# Patient Record
Sex: Female | Born: 1950
Health system: Southern US, Community
[De-identification: ages and names within clinical notes are randomized; demographics above are authoritative.]

## PROBLEM LIST (undated history)

## (undated) DIAGNOSIS — E559 Vitamin D deficiency, unspecified: Secondary | ICD-10-CM

## (undated) DIAGNOSIS — R9431 Abnormal electrocardiogram [ECG] [EKG]: Secondary | ICD-10-CM

## (undated) DIAGNOSIS — R0683 Snoring: Secondary | ICD-10-CM

## (undated) DIAGNOSIS — N189 Chronic kidney disease, unspecified: Secondary | ICD-10-CM

## (undated) DIAGNOSIS — K5790 Diverticulosis of intestine, part unspecified, without perforation or abscess without bleeding: Secondary | ICD-10-CM

## (undated) DIAGNOSIS — N3943 Post-void dribbling: Secondary | ICD-10-CM

## (undated) DIAGNOSIS — E785 Hyperlipidemia, unspecified: Secondary | ICD-10-CM

## (undated) DIAGNOSIS — T7840XA Allergy, unspecified, initial encounter: Secondary | ICD-10-CM

## (undated) DIAGNOSIS — B977 Papillomavirus as the cause of diseases classified elsewhere: Secondary | ICD-10-CM

## (undated) DIAGNOSIS — H269 Unspecified cataract: Secondary | ICD-10-CM

## (undated) DIAGNOSIS — I10 Essential (primary) hypertension: Secondary | ICD-10-CM

## (undated) DIAGNOSIS — E119 Type 2 diabetes mellitus without complications: Secondary | ICD-10-CM

## (undated) DIAGNOSIS — G2581 Restless legs syndrome: Secondary | ICD-10-CM

## (undated) DIAGNOSIS — M1990 Unspecified osteoarthritis, unspecified site: Secondary | ICD-10-CM

## (undated) HISTORY — DX: Chronic kidney disease, unspecified: N18.9

## (undated) HISTORY — DX: Hyperlipidemia, unspecified: E78.5

## (undated) HISTORY — DX: Allergy, unspecified, initial encounter: T78.40XA

## (undated) HISTORY — DX: Restless legs syndrome: G25.81

## (undated) HISTORY — DX: Vitamin D deficiency, unspecified: E55.9

## (undated) HISTORY — DX: Unspecified cataract: H26.9

## (undated) HISTORY — DX: Essential (primary) hypertension: I10

## (undated) HISTORY — DX: Type 2 diabetes mellitus without complications: E11.9

## (undated) HISTORY — DX: Abnormal electrocardiogram (ECG) (EKG): R94.31

## (undated) HISTORY — DX: Unspecified osteoarthritis, unspecified site: M19.90

## (undated) HISTORY — DX: Papillomavirus as the cause of diseases classified elsewhere: B97.7

---
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
## 1979-10-15 HISTORY — PX: TUBAL LIGATION: SHX77

## 1979-10-15 HISTORY — PX: OVARIAN CYST REMOVAL: SHX89

## 1990-10-14 HISTORY — PX: VAGINAL HYSTERECTOMY: SUR661

## 1998-11-28 ENCOUNTER — Ambulatory Visit (HOSPITAL_COMMUNITY): Admission: RE | Admit: 1998-11-28 | Discharge: 1998-11-28 | Payer: Self-pay | Admitting: Obstetrics & Gynecology

## 1998-11-28 ENCOUNTER — Encounter: Payer: Self-pay | Admitting: Obstetrics & Gynecology

## 1999-11-13 ENCOUNTER — Other Ambulatory Visit: Admission: RE | Admit: 1999-11-13 | Discharge: 1999-11-13 | Payer: Self-pay | Admitting: Obstetrics and Gynecology

## 1999-11-30 ENCOUNTER — Encounter: Payer: Self-pay | Admitting: Obstetrics & Gynecology

## 1999-11-30 ENCOUNTER — Ambulatory Visit (HOSPITAL_COMMUNITY): Admission: RE | Admit: 1999-11-30 | Discharge: 1999-11-30 | Payer: Self-pay | Admitting: Obstetrics & Gynecology

## 2000-12-11 ENCOUNTER — Other Ambulatory Visit: Admission: RE | Admit: 2000-12-11 | Discharge: 2000-12-11 | Payer: Self-pay | Admitting: Obstetrics and Gynecology

## 2000-12-30 ENCOUNTER — Encounter: Payer: Self-pay | Admitting: Obstetrics and Gynecology

## 2000-12-30 ENCOUNTER — Ambulatory Visit (HOSPITAL_COMMUNITY): Admission: RE | Admit: 2000-12-30 | Discharge: 2000-12-30 | Payer: Self-pay | Admitting: Obstetrics and Gynecology

## 2001-12-08 ENCOUNTER — Other Ambulatory Visit: Admission: RE | Admit: 2001-12-08 | Discharge: 2001-12-08 | Payer: Self-pay | Admitting: Obstetrics and Gynecology

## 2001-12-31 ENCOUNTER — Ambulatory Visit (HOSPITAL_COMMUNITY): Admission: RE | Admit: 2001-12-31 | Discharge: 2001-12-31 | Payer: Self-pay | Admitting: Obstetrics and Gynecology

## 2001-12-31 ENCOUNTER — Encounter: Payer: Self-pay | Admitting: Obstetrics and Gynecology

## 2003-01-04 ENCOUNTER — Ambulatory Visit (HOSPITAL_COMMUNITY): Admission: RE | Admit: 2003-01-04 | Discharge: 2003-01-04 | Payer: Self-pay | Admitting: Obstetrics and Gynecology

## 2003-01-04 ENCOUNTER — Encounter: Payer: Self-pay | Admitting: Obstetrics and Gynecology

## 2003-12-08 ENCOUNTER — Other Ambulatory Visit: Admission: RE | Admit: 2003-12-08 | Discharge: 2003-12-08 | Payer: Self-pay | Admitting: Obstetrics and Gynecology

## 2004-07-03 ENCOUNTER — Ambulatory Visit (HOSPITAL_COMMUNITY): Admission: RE | Admit: 2004-07-03 | Discharge: 2004-07-03 | Payer: Self-pay | Admitting: Obstetrics and Gynecology

## 2006-01-06 ENCOUNTER — Ambulatory Visit (HOSPITAL_COMMUNITY): Admission: RE | Admit: 2006-01-06 | Discharge: 2006-01-06 | Payer: Self-pay | Admitting: Internal Medicine

## 2006-01-22 ENCOUNTER — Ambulatory Visit: Payer: Self-pay | Admitting: Gastroenterology

## 2006-03-03 ENCOUNTER — Ambulatory Visit: Payer: Self-pay | Admitting: Gastroenterology

## 2006-03-17 ENCOUNTER — Ambulatory Visit: Payer: Self-pay | Admitting: Gastroenterology

## 2007-01-26 ENCOUNTER — Ambulatory Visit (HOSPITAL_COMMUNITY): Admission: RE | Admit: 2007-01-26 | Discharge: 2007-01-26 | Payer: Self-pay | Admitting: Internal Medicine

## 2008-02-02 ENCOUNTER — Ambulatory Visit (HOSPITAL_COMMUNITY): Admission: RE | Admit: 2008-02-02 | Discharge: 2008-02-02 | Payer: Self-pay | Admitting: Internal Medicine

## 2008-08-03 ENCOUNTER — Ambulatory Visit (HOSPITAL_COMMUNITY): Admission: RE | Admit: 2008-08-03 | Discharge: 2008-08-03 | Payer: Self-pay | Admitting: Internal Medicine

## 2009-03-07 ENCOUNTER — Ambulatory Visit (HOSPITAL_COMMUNITY): Admission: RE | Admit: 2009-03-07 | Discharge: 2009-03-07 | Payer: Self-pay | Admitting: Internal Medicine

## 2009-09-08 ENCOUNTER — Ambulatory Visit: Payer: Self-pay | Admitting: Family Medicine

## 2009-09-08 DIAGNOSIS — K5730 Diverticulosis of large intestine without perforation or abscess without bleeding: Secondary | ICD-10-CM | POA: Insufficient documentation

## 2009-11-06 LAB — HM PAP SMEAR: HM Pap smear: NORMAL

## 2010-02-07 ENCOUNTER — Other Ambulatory Visit: Admission: RE | Admit: 2010-02-07 | Discharge: 2010-02-07 | Payer: Self-pay | Admitting: Internal Medicine

## 2010-03-29 ENCOUNTER — Ambulatory Visit (HOSPITAL_COMMUNITY): Admission: RE | Admit: 2010-03-29 | Discharge: 2010-03-29 | Payer: Self-pay | Admitting: Internal Medicine

## 2010-10-14 HISTORY — PX: COLONOSCOPY: SHX174

## 2011-04-08 ENCOUNTER — Other Ambulatory Visit (HOSPITAL_COMMUNITY): Payer: Self-pay | Admitting: Internal Medicine

## 2011-04-08 DIAGNOSIS — Z1231 Encounter for screening mammogram for malignant neoplasm of breast: Secondary | ICD-10-CM

## 2011-04-10 ENCOUNTER — Ambulatory Visit (HOSPITAL_COMMUNITY)
Admission: RE | Admit: 2011-04-10 | Discharge: 2011-04-10 | Disposition: A | Discharge status: Home / Self Care | Payer: 59 | Source: Ambulatory Visit | Attending: Internal Medicine | Admitting: Internal Medicine

## 2011-04-10 DIAGNOSIS — Z1231 Encounter for screening mammogram for malignant neoplasm of breast: Secondary | ICD-10-CM | POA: Insufficient documentation

## 2011-04-29 ENCOUNTER — Encounter: Payer: Self-pay | Admitting: Gastroenterology

## 2011-06-20 ENCOUNTER — Ambulatory Visit (AMBULATORY_SURGERY_CENTER): Payer: 59 | Admitting: *Deleted

## 2011-06-20 VITALS — Ht 65.0 in | Wt 166.2 lb

## 2011-06-20 DIAGNOSIS — Z1211 Encounter for screening for malignant neoplasm of colon: Secondary | ICD-10-CM

## 2011-06-20 MED ORDER — SUPREP BOWEL PREP KIT 17.5-3.13-1.6 GM/177ML PO SOLN: 1.0000 | Freq: Once | ORAL | Status: DC

## 2011-06-26 LAB — HM COLONOSCOPY

## 2011-07-04 ENCOUNTER — Ambulatory Visit (AMBULATORY_SURGERY_CENTER): Payer: 59 | Admitting: Gastroenterology

## 2011-07-04 ENCOUNTER — Encounter: Payer: Self-pay | Admitting: Gastroenterology

## 2011-07-04 DIAGNOSIS — Z1211 Encounter for screening for malignant neoplasm of colon: Secondary | ICD-10-CM

## 2011-07-04 DIAGNOSIS — D126 Benign neoplasm of colon, unspecified: Secondary | ICD-10-CM

## 2011-07-04 DIAGNOSIS — K5730 Diverticulosis of large intestine without perforation or abscess without bleeding: Secondary | ICD-10-CM

## 2011-07-04 DIAGNOSIS — Z800 Family history of malignant neoplasm of digestive organs: Secondary | ICD-10-CM

## 2011-07-04 LAB — GLUCOSE, CAPILLARY: Glucose-Capillary: 103 mg/dL — ABNORMAL HIGH (ref 70–99)

## 2011-07-04 MED ORDER — SODIUM CHLORIDE 0.9 % IV SOLN: 500.0000 mL | INTRAVENOUS | Status: DC

## 2011-07-04 NOTE — Patient Instructions (Signed)
FOLLOW DISCHARGE INSTRUCTIONS (BLUE & GREEN SHEETS)   INFORMATION GIVEN ON POLYPS , DIVERTICULOSIS & HIGH FIBER DIET.

## 2011-07-05 ENCOUNTER — Telehealth: Payer: Self-pay | Admitting: *Deleted

## 2011-07-05 NOTE — Telephone Encounter (Signed)
No answer at home, voice mail message left for patient on mobile telephone.

## 2012-02-26 ENCOUNTER — Other Ambulatory Visit: Payer: Self-pay

## 2012-03-10 ENCOUNTER — Other Ambulatory Visit (HOSPITAL_COMMUNITY): Payer: Self-pay | Admitting: Orthopedic Surgery

## 2012-03-10 DIAGNOSIS — M549 Dorsalgia, unspecified: Secondary | ICD-10-CM

## 2012-03-12 ENCOUNTER — Ambulatory Visit (HOSPITAL_COMMUNITY)
Admission: RE | Admit: 2012-03-12 | Discharge: 2012-03-12 | Disposition: A | Discharge status: Home / Self Care | Payer: 59 | Source: Ambulatory Visit | Attending: Orthopedic Surgery | Admitting: Orthopedic Surgery

## 2012-03-12 DIAGNOSIS — M129 Arthropathy, unspecified: Secondary | ICD-10-CM | POA: Insufficient documentation

## 2012-03-12 DIAGNOSIS — M549 Dorsalgia, unspecified: Secondary | ICD-10-CM

## 2012-03-12 DIAGNOSIS — M4120 Other idiopathic scoliosis, site unspecified: Secondary | ICD-10-CM | POA: Insufficient documentation

## 2012-03-12 DIAGNOSIS — M545 Low back pain, unspecified: Secondary | ICD-10-CM | POA: Insufficient documentation

## 2012-03-12 DIAGNOSIS — M5146 Schmorl's nodes, lumbar region: Secondary | ICD-10-CM | POA: Insufficient documentation

## 2012-03-12 DIAGNOSIS — R29898 Other symptoms and signs involving the musculoskeletal system: Secondary | ICD-10-CM | POA: Insufficient documentation

## 2012-03-12 DIAGNOSIS — R209 Unspecified disturbances of skin sensation: Secondary | ICD-10-CM | POA: Insufficient documentation

## 2012-04-10 ENCOUNTER — Other Ambulatory Visit (HOSPITAL_COMMUNITY): Payer: Self-pay | Admitting: Physician Assistant

## 2012-04-10 DIAGNOSIS — Z1231 Encounter for screening mammogram for malignant neoplasm of breast: Secondary | ICD-10-CM

## 2012-04-17 ENCOUNTER — Ambulatory Visit (HOSPITAL_COMMUNITY)
Admission: RE | Admit: 2012-04-17 | Discharge: 2012-04-17 | Disposition: A | Discharge status: Home / Self Care | Payer: 59 | Source: Ambulatory Visit | Attending: Physician Assistant | Admitting: Physician Assistant

## 2012-04-17 DIAGNOSIS — Z1231 Encounter for screening mammogram for malignant neoplasm of breast: Secondary | ICD-10-CM | POA: Insufficient documentation

## 2012-04-22 ENCOUNTER — Other Ambulatory Visit: Payer: Self-pay | Admitting: Physician Assistant

## 2012-04-22 DIAGNOSIS — R928 Other abnormal and inconclusive findings on diagnostic imaging of breast: Secondary | ICD-10-CM

## 2012-04-27 ENCOUNTER — Ambulatory Visit
Admission: RE | Admit: 2012-04-27 | Discharge: 2012-04-27 | Disposition: A | Discharge status: Home / Self Care | Payer: 59 | Source: Ambulatory Visit | Attending: Physician Assistant | Admitting: Physician Assistant

## 2012-04-27 ENCOUNTER — Other Ambulatory Visit: Payer: Self-pay | Admitting: Orthopedic Surgery

## 2012-04-27 DIAGNOSIS — R928 Other abnormal and inconclusive findings on diagnostic imaging of breast: Secondary | ICD-10-CM

## 2012-05-04 ENCOUNTER — Encounter (HOSPITAL_COMMUNITY)
Admission: RE | Admit: 2012-05-04 | Discharge: 2012-05-04 | Disposition: A | Discharge status: Home / Self Care | Payer: 59 | Source: Ambulatory Visit | Attending: Orthopedic Surgery | Admitting: Orthopedic Surgery

## 2012-05-04 ENCOUNTER — Ambulatory Visit (HOSPITAL_COMMUNITY)
Admission: RE | Admit: 2012-05-04 | Discharge: 2012-05-04 | Disposition: A | Discharge status: Home / Self Care | Payer: 59 | Source: Ambulatory Visit | Attending: Orthopedic Surgery | Admitting: Orthopedic Surgery

## 2012-05-04 ENCOUNTER — Encounter (HOSPITAL_COMMUNITY): Payer: Self-pay

## 2012-05-04 DIAGNOSIS — Z01818 Encounter for other preprocedural examination: Secondary | ICD-10-CM | POA: Insufficient documentation

## 2012-05-04 DIAGNOSIS — Z01810 Encounter for preprocedural cardiovascular examination: Secondary | ICD-10-CM | POA: Insufficient documentation

## 2012-05-04 DIAGNOSIS — Z01812 Encounter for preprocedural laboratory examination: Secondary | ICD-10-CM | POA: Insufficient documentation

## 2012-05-04 HISTORY — DX: Snoring: R06.83

## 2012-05-04 HISTORY — DX: Post-void dribbling: N39.43

## 2012-05-04 HISTORY — DX: Diverticulosis of intestine, part unspecified, without perforation or abscess without bleeding: K57.90

## 2012-05-04 LAB — DIFFERENTIAL
Basophils Relative: 0 % (ref 0–1)
Eosinophils Absolute: 0.1 10*3/uL (ref 0.0–0.7)
Lymphs Abs: 2.4 10*3/uL (ref 0.7–4.0)
Monocytes Absolute: 0.4 10*3/uL (ref 0.1–1.0)
Monocytes Relative: 7 % (ref 3–12)
Neutrophils Relative %: 48 % (ref 43–77)

## 2012-05-04 LAB — COMPREHENSIVE METABOLIC PANEL
AST: 19 U/L (ref 0–37)
Albumin: 4 g/dL (ref 3.5–5.2)
Alkaline Phosphatase: 87 U/L (ref 39–117)
BUN: 14 mg/dL (ref 6–23)
Chloride: 102 mEq/L (ref 96–112)
Potassium: 4.1 mEq/L (ref 3.5–5.1)
Sodium: 139 mEq/L (ref 135–145)
Total Bilirubin: 0.3 mg/dL (ref 0.3–1.2)
Total Protein: 7.6 g/dL (ref 6.0–8.3)

## 2012-05-04 LAB — CBC
MCHC: 34.6 g/dL (ref 30.0–36.0)
Platelets: 165 10*3/uL (ref 150–400)
RDW: 13.1 % (ref 11.5–15.5)
WBC: 5.5 10*3/uL (ref 4.0–10.5)

## 2012-05-04 LAB — URINALYSIS, ROUTINE W REFLEX MICROSCOPIC
Glucose, UA: NEGATIVE mg/dL
Ketones, ur: NEGATIVE mg/dL
Leukocytes, UA: NEGATIVE
Nitrite: NEGATIVE
Protein, ur: NEGATIVE mg/dL
pH: 6 (ref 5.0–8.0)

## 2012-05-04 LAB — TYPE AND SCREEN: ABO/RH(D): A POS

## 2012-05-04 LAB — SURGICAL PCR SCREEN: MRSA, PCR: NEGATIVE

## 2012-05-04 LAB — APTT: aPTT: 29 seconds (ref 24–37)

## 2012-05-04 LAB — PROTIME-INR: INR: 1.05 (ref 0.00–1.49)

## 2012-05-04 NOTE — Progress Notes (Signed)
Patient denied having a stress test, cardiac cath, or sleep study.  

## 2012-05-04 NOTE — Pre-Procedure Instructions (Signed)
20 Rachel Daniel  05/04/2012   Your procedure is scheduled on:  Thursday May 07, 2012  Report to [facility] at 1120 AM.  Call this number if you have problems the morning of surgery: [phone]   Remember:   Do not eat food:After Midnight.   Take these medicines the morning of surgery with A SIP OF WATER: NONE   Do not wear jewelry, make-up or nail polish.  Do not wear lotions, powders, or perfumes.   Do not shave 48 hours prior to surgery.   Do not bring valuables to the hospital.  Contacts, dentures or bridgework may not be worn into surgery.  Leave suitcase in the car. After surgery it may be brought to your room.  For patients admitted to the hospital, checkout time is 11:00 AM the day of discharge.   Patients discharged the day of surgery will not be allowed to drive home.  Name and phone number of your driver:   Special Instructions: CHG Shower Use Special Wash: 1/2 bottle night before surgery and 1/2 bottle morning of surgery.   Please read over the following fact sheets that you were given: Pain Booklet, Coughing and Deep Breathing, MRSA Information and Surgical Site Infection Prevention

## 2012-05-05 NOTE — Consult Note (Signed)
Anesthesia Chart Review:  Patient is a 61 year old female posted for lumbar laminectomy/microdiscectomy on 05/07/12 by Dr. Yevette Edwards.  History includes non-smoker, DM2 diet controlled, RLS, arthritis, snoring, diverticulosis, right BBB.  EKG from 05/04/12 showed NSR, PAC, incomplete right BBB.  CXR on 05/04/12 showed no active cardiopulmonary disease.  Labs noted.  No CV symptoms were documented at her PAT visit.  Anticipate she can proceed as planned.  Shonna Chock, PA-C

## 2012-05-06 LAB — HM MAMMOGRAPHY: HM Mammogram: NORMAL

## 2012-05-06 MED ORDER — CLINDAMYCIN PHOSPHATE 900 MG/50ML IV SOLN
900.0000 mg | INTRAVENOUS | Status: AC
  Administered 2012-05-07: 900 mg via INTRAVENOUS
  Filled 2012-05-06: qty 50

## 2012-05-06 MED ORDER — POVIDONE-IODINE 7.5 % EX SOLN
Freq: Once | CUTANEOUS | Status: DC
  Filled 2012-05-06: qty 118

## 2012-05-07 ENCOUNTER — Encounter (HOSPITAL_COMMUNITY): Payer: Self-pay | Admitting: *Deleted

## 2012-05-07 ENCOUNTER — Encounter (HOSPITAL_COMMUNITY): Payer: Self-pay | Admitting: Vascular Surgery

## 2012-05-07 ENCOUNTER — Ambulatory Visit (HOSPITAL_COMMUNITY): Payer: 59

## 2012-05-07 ENCOUNTER — Ambulatory Visit (HOSPITAL_COMMUNITY)
Admission: RE | Admit: 2012-05-07 | Discharge: 2012-05-07 | Disposition: A | Discharge status: Home / Self Care | Payer: 59 | Source: Ambulatory Visit | Attending: Orthopedic Surgery | Admitting: Orthopedic Surgery

## 2012-05-07 ENCOUNTER — Ambulatory Visit (HOSPITAL_COMMUNITY): Payer: 59 | Admitting: Vascular Surgery

## 2012-05-07 ENCOUNTER — Encounter (HOSPITAL_COMMUNITY)
Admission: RE | Disposition: A | Discharge status: Home / Self Care | Payer: Self-pay | Source: Ambulatory Visit | Attending: Orthopedic Surgery

## 2012-05-07 DIAGNOSIS — M48061 Spinal stenosis, lumbar region without neurogenic claudication: Secondary | ICD-10-CM | POA: Insufficient documentation

## 2012-05-07 DIAGNOSIS — M4800 Spinal stenosis, site unspecified: Secondary | ICD-10-CM

## 2012-05-07 DIAGNOSIS — M129 Arthropathy, unspecified: Secondary | ICD-10-CM | POA: Insufficient documentation

## 2012-05-07 DIAGNOSIS — E119 Type 2 diabetes mellitus without complications: Secondary | ICD-10-CM | POA: Insufficient documentation

## 2012-05-07 HISTORY — PX: LUMBAR LAMINECTOMY/DECOMPRESSION MICRODISCECTOMY: SHX5026

## 2012-05-07 LAB — GLUCOSE, CAPILLARY
Glucose-Capillary: 121 mg/dL — ABNORMAL HIGH (ref 70–99)
Glucose-Capillary: 121 mg/dL — ABNORMAL HIGH (ref 70–99)

## 2012-05-07 SURGERY — LUMBAR LAMINECTOMY/DECOMPRESSION MICRODISCECTOMY
Anesthesia: General | Site: Spine Lumbar | Laterality: Bilateral | Wound class: Clean

## 2012-05-07 MED ORDER — HEMOSTATIC AGENTS (NO CHARGE) OPTIME
TOPICAL | Status: DC | PRN
  Administered 2012-05-07: 1 via TOPICAL

## 2012-05-07 MED ORDER — PROPOFOL 10 MG/ML IV BOLUS
INTRAVENOUS | Status: DC | PRN
  Administered 2012-05-07: 160 mg via INTRAVENOUS

## 2012-05-07 MED ORDER — DROPERIDOL 2.5 MG/ML IJ SOLN
INTRAMUSCULAR | Status: AC
  Administered 2012-05-07: 6.25 mg
  Filled 2012-05-07: qty 2

## 2012-05-07 MED ORDER — SUFENTANIL CITRATE 50 MCG/ML IV SOLN
INTRAVENOUS | Status: DC | PRN
  Administered 2012-05-07: 10 ug via INTRAVENOUS
  Administered 2012-05-07: 15 ug via INTRAVENOUS
  Administered 2012-05-07 (×2): 10 ug via INTRAVENOUS

## 2012-05-07 MED ORDER — LIDOCAINE HCL (CARDIAC) 20 MG/ML IV SOLN
INTRAVENOUS | Status: DC | PRN
  Administered 2012-05-07: 100 mg via INTRAVENOUS

## 2012-05-07 MED ORDER — THROMBIN 20000 UNITS EX SOLR
CUTANEOUS | Status: AC
  Filled 2012-05-07: qty 20000

## 2012-05-07 MED ORDER — METHYLPREDNISOLONE ACETATE 40 MG/ML IJ SUSP
INTRAMUSCULAR | Status: DC | PRN
  Administered 2012-05-07: .5 mL via INTRALESIONAL

## 2012-05-07 MED ORDER — MIDAZOLAM HCL 5 MG/5ML IJ SOLN
INTRAMUSCULAR | Status: DC | PRN
  Administered 2012-05-07: 2 mg via INTRAVENOUS

## 2012-05-07 MED ORDER — INDIGOTINDISULFONATE SODIUM 8 MG/ML IJ SOLN
INTRAMUSCULAR | Status: DC | PRN
  Administered 2012-05-07: 1 mL via INTRAVENOUS

## 2012-05-07 MED ORDER — BUPIVACAINE-EPINEPHRINE PF 0.25-1:200000 % IJ SOLN
INTRAMUSCULAR | Status: AC
  Filled 2012-05-07: qty 30

## 2012-05-07 MED ORDER — PHENYLEPHRINE HCL 10 MG/ML IJ SOLN
INTRAMUSCULAR | Status: DC | PRN
  Administered 2012-05-07: 40 ug via INTRAVENOUS

## 2012-05-07 MED ORDER — SURGIFOAM 100 EX MISC
CUTANEOUS | Status: DC | PRN
  Administered 2012-05-07: 16:00:00 via TOPICAL

## 2012-05-07 MED ORDER — METHYLPREDNISOLONE ACETATE 80 MG/ML IJ SUSP
INTRAMUSCULAR | Status: AC
  Filled 2012-05-07: qty 1

## 2012-05-07 MED ORDER — EPHEDRINE SULFATE 50 MG/ML IJ SOLN
INTRAMUSCULAR | Status: DC | PRN
  Administered 2012-05-07: 10 mg via INTRAVENOUS
  Administered 2012-05-07: 5 mg via INTRAVENOUS

## 2012-05-07 MED ORDER — THROMBIN 20000 UNITS EX KIT
PACK | CUTANEOUS | Status: DC | PRN
  Administered 2012-05-07: 20000 [IU] via TOPICAL

## 2012-05-07 MED ORDER — ONDANSETRON HCL 4 MG/2ML IJ SOLN
4.0000 mg | Freq: Once | INTRAMUSCULAR | Status: AC | PRN
  Administered 2012-05-07: 4 mg via INTRAVENOUS

## 2012-05-07 MED ORDER — NEOSTIGMINE METHYLSULFATE 1 MG/ML IJ SOLN
INTRAMUSCULAR | Status: DC | PRN
  Administered 2012-05-07: 5 mg via INTRAVENOUS

## 2012-05-07 MED ORDER — GLYCOPYRROLATE 0.2 MG/ML IJ SOLN
INTRAMUSCULAR | Status: DC | PRN
  Administered 2012-05-07: 0.6 mg via INTRAVENOUS

## 2012-05-07 MED ORDER — HYDROMORPHONE HCL PF 1 MG/ML IJ SOLN: 0.2500 mg | INTRAMUSCULAR | Status: DC | PRN

## 2012-05-07 MED ORDER — BUPIVACAINE-EPINEPHRINE 0.25% -1:200000 IJ SOLN
INTRAMUSCULAR | Status: DC | PRN
  Administered 2012-05-07: 6 mL

## 2012-05-07 MED ORDER — ROCURONIUM BROMIDE 100 MG/10ML IV SOLN
INTRAVENOUS | Status: DC | PRN
  Administered 2012-05-07: 50 mg via INTRAVENOUS

## 2012-05-07 MED ORDER — LACTATED RINGERS IV SOLN
INTRAVENOUS | Status: DC
  Administered 2012-05-07 (×3): via INTRAVENOUS

## 2012-05-07 MED ORDER — ONDANSETRON HCL 4 MG/2ML IJ SOLN
INTRAMUSCULAR | Status: AC
  Filled 2012-05-07: qty 2

## 2012-05-07 SURGICAL SUPPLY — 61 items
APL SKNCLS STERI-STRIP NONHPOA (GAUZE/BANDAGES/DRESSINGS) ×1
BENZOIN TINCTURE PRP APPL 2/3 (GAUZE/BANDAGES/DRESSINGS) ×1 IMPLANT
BUR ROUND PRECISION 4.0 (BURR) ×2 IMPLANT
CANISTER SUCTION 2500CC (MISCELLANEOUS) ×2 IMPLANT
CLOTH BEACON ORANGE TIMEOUT ST (SAFETY) ×2 IMPLANT
CONT SPEC STER OR (MISCELLANEOUS) ×2 IMPLANT
CORDS BIPOLAR (ELECTRODE) ×2 IMPLANT
COVER SURGICAL LIGHT HANDLE (MISCELLANEOUS) ×2 IMPLANT
DRAIN CHANNEL 10F 3/8 F FF (DRAIN) IMPLANT
DRAPE POUCH INSTRU U-SHP 10X18 (DRAPES) ×4 IMPLANT
DRAPE SURG 17X23 STRL (DRAPES) ×8 IMPLANT
DURAPREP 26ML APPLICATOR (WOUND CARE) ×2 IMPLANT
ELECT BLADE 4.0 EZ CLEAN MEGAD (MISCELLANEOUS)
ELECT BLADE 6.5 EXT (BLADE) IMPLANT
ELECT CAUTERY BLADE 6.4 (BLADE) ×2 IMPLANT
ELECT REM PT RETURN 9FT ADLT (ELECTROSURGICAL) ×2
ELECTRODE BLDE 4.0 EZ CLN MEGD (MISCELLANEOUS) IMPLANT
ELECTRODE REM PT RTRN 9FT ADLT (ELECTROSURGICAL) ×1 IMPLANT
EVACUATOR SILICONE 100CC (DRAIN) IMPLANT
FILTER STRAW FLUID ASPIR (MISCELLANEOUS) ×2 IMPLANT
GAUZE SPONGE 4X4 16PLY XRAY LF (GAUZE/BANDAGES/DRESSINGS) ×4 IMPLANT
GLOVE BIO SURGEON STRL SZ8 (GLOVE) ×2 IMPLANT
GLOVE BIOGEL PI IND STRL 8 (GLOVE) ×1 IMPLANT
GLOVE BIOGEL PI INDICATOR 8 (GLOVE) ×1
GOWN STRL NON-REIN LRG LVL3 (GOWN DISPOSABLE) ×4 IMPLANT
IV CATH 14GX2 1/4 (CATHETERS) ×2 IMPLANT
KIT BASIN OR (CUSTOM PROCEDURE TRAY) ×2 IMPLANT
KIT ROOM TURNOVER OR (KITS) ×2 IMPLANT
NDL 18GX1X1/2 (RX/OR ONLY) (NEEDLE) ×1 IMPLANT
NDL HYPO 25GX1X1/2 BEV (NEEDLE) ×1 IMPLANT
NDL SPNL 18GX3.5 QUINCKE PK (NEEDLE) ×2 IMPLANT
NEEDLE 18GX1X1/2 (RX/OR ONLY) (NEEDLE) ×2 IMPLANT
NEEDLE HYPO 25GX1X1/2 BEV (NEEDLE) ×2 IMPLANT
NEEDLE SPNL 18GX3.5 QUINCKE PK (NEEDLE) ×4 IMPLANT
NS IRRIG 1000ML POUR BTL (IV SOLUTION) ×2 IMPLANT
PACK LAMINECTOMY ORTHO (CUSTOM PROCEDURE TRAY) ×2 IMPLANT
PACK UNIVERSAL I (CUSTOM PROCEDURE TRAY) ×2 IMPLANT
PAD ARMBOARD 7.5X6 YLW CONV (MISCELLANEOUS) ×4 IMPLANT
PATTIES SURGICAL .5 X.5 (GAUZE/BANDAGES/DRESSINGS) IMPLANT
PATTIES SURGICAL .5 X1 (DISPOSABLE) ×2 IMPLANT
PATTIES SURGICAL 1X1 (DISPOSABLE) IMPLANT
SPONGE GAUZE 4X4 12PLY (GAUZE/BANDAGES/DRESSINGS) ×2 IMPLANT
STRIP CLOSURE SKIN 1/2X4 (GAUZE/BANDAGES/DRESSINGS) ×1 IMPLANT
SURGIFLO TRUKIT (HEMOSTASIS) ×1 IMPLANT
SUT ETHILON 3 0 FSL (SUTURE) IMPLANT
SUT VIC AB 0 CT1 27 (SUTURE)
SUT VIC AB 0 CT1 27XBRD ANBCTR (SUTURE) IMPLANT
SUT VIC AB 0 CT2 27 (SUTURE) ×2 IMPLANT
SUT VIC AB 1 CT1 18XCR BRD 8 (SUTURE) ×1 IMPLANT
SUT VIC AB 1 CT1 8-18 (SUTURE) ×2
SUT VIC AB 2-0 CT2 18 VCP726D (SUTURE) ×2 IMPLANT
SYR 20CC LL (SYRINGE) IMPLANT
SYR BULB IRRIGATION 50ML (SYRINGE) ×2 IMPLANT
SYR CONTROL 10ML LL (SYRINGE) ×2 IMPLANT
SYR TB 1ML 26GX3/8 SAFETY (SYRINGE) ×4 IMPLANT
SYR TB 1ML LUER SLIP (SYRINGE) ×4 IMPLANT
TAPE CLOTH SURG 6X10 WHT LF (GAUZE/BANDAGES/DRESSINGS) ×1 IMPLANT
TOWEL OR 17X24 6PK STRL BLUE (TOWEL DISPOSABLE) ×2 IMPLANT
TOWEL OR 17X26 10 PK STRL BLUE (TOWEL DISPOSABLE) ×2 IMPLANT
WATER STERILE IRR 1000ML POUR (IV SOLUTION) ×2 IMPLANT
YANKAUER SUCT BULB TIP NO VENT (SUCTIONS) ×2 IMPLANT

## 2012-05-07 NOTE — H&P (Signed)
PREOPERATIVE H&P  Chief Complaint: bilateral leg pain  HPI: Rachel Daniel is a 61 y.o. female who presents with bilateral leg pain  Past Medical History  Diagnosis Date  . Arthritis     hips  . Restless leg syndrome   . EKG abnormalities     pt states she had a BBB on her last EKG  . Snoring   . Diet-controlled type 2 diabetes mellitus     Diet controlled  . Diverticulosis   . Dribbling urine    Past Surgical History  Procedure Date  . Vaginal hysterectomy 1992  . Tubal ligation 1981  . Ovarian cyst removal 1981    right  . Breast biopsy 1969    left  . Colonoscopy    History   Social History  . Marital Status: Widowed    Spouse Name: N/A    Number of Children: N/A  . Years of Education: N/A   Social History Main Topics  . Smoking status: Never Smoker   . Smokeless tobacco: Not on file  . Alcohol Use: No  . Drug Use: No  . Sexually Active: Not on file   Other Topics Concern  . Not on file   Social History Narrative  . No narrative on file   Family History  Problem Relation Age of Onset  . Colon cancer Mother 45  . Stomach cancer Neg Hx   . Esophageal cancer Neg Hx    No Known Allergies Prior to Admission medications   Medication Sig Start Date End Date Taking? Authorizing Provider  CRANBERRY EXTRACT PO Take 1 tablet by mouth daily.     Yes Historical Provider, MD  Multiple Vitamin (MULTIVITAMIN PO) Take 1 tablet by mouth daily.     Yes Historical Provider, MD  Phenazopyridine HCl (AZO TABS PO) Take 2 tablets by mouth daily.   Yes Historical Provider, MD  rOPINIRole (REQUIP) 1 MG tablet Take 1 mg by mouth at bedtime.     Yes Historical Provider, MD  VITAMIN D, CHOLECALCIFEROL, PO Take 2 tablets by mouth daily.   Yes Historical Provider, MD  fish oil-omega-3 fatty acids 1000 MG capsule Take by mouth daily.      Historical Provider, MD     All other systems have been reviewed and were otherwise negative with the exception of those mentioned in the  HPI and as above.  Physical Exam: There were no vitals filed for this visit.  General: Alert, no acute distress Cardiovascular: No pedal edema Respiratory: No cyanosis, no use of accessory musculature GI: No organomegaly, abdomen is soft and non-tender Skin: No lesions in the area of chief complaint Neurologic: Sensation intact distally Psychiatric: Patient is competent for consent with normal mood and affect Lymphatic: No axillary or cervical lymphadenopathy  Assessment/Plan: Bilateral leg pain from spinal stenosis Plan for Procedure(s): LUMBAR LAMINECTOMY   Emilee Hero, MD 05/07/2012 6:54 AM

## 2012-05-07 NOTE — Anesthesia Postprocedure Evaluation (Signed)
  Anesthesia Post-op Note  Patient: Rachel Daniel  Procedure(s) Performed: Procedure(s) (LRB): LUMBAR LAMINECTOMY/DECOMPRESSION MICRODISCECTOMY (Bilateral)  Patient Location: PACU  Anesthesia Type: General  Level of Consciousness: awake  Airway and Oxygen Therapy: Patient Spontanous Breathing  Post-op Pain: mild  Post-op Assessment: Post-op Vital signs reviewed  Post-op Vital Signs: Reviewed  Complications: No apparent anesthesia complications

## 2012-05-07 NOTE — Transfer of Care (Signed)
Immediate Anesthesia Transfer of Care Note  Patient: Rachel Daniel  Procedure(s) Performed: Procedure(s) (LRB): LUMBAR LAMINECTOMY/DECOMPRESSION MICRODISCECTOMY (Bilateral)  Patient Location: PACU  Anesthesia Type: General  Level of Consciousness: awake and sedated  Airway & Oxygen Therapy: Patient Spontanous Breathing and Patient connected to face mask oxygen  Post-op Assessment: Report given to PACU RN and Post -op Vital signs reviewed and stable  Post vital signs: Reviewed and stable  Complications: No apparent anesthesia complications

## 2012-05-07 NOTE — Anesthesia Preprocedure Evaluation (Addendum)
Anesthesia Evaluation  Patient identified by MRN, date of birth, ID band Patient awake    Reviewed: Allergy & Precautions, H&P , NPO status , Patient's Chart, lab work & pertinent test results, reviewed documented beta blocker date and time   Airway Mallampati: II      Dental   Pulmonary neg pulmonary ROS,  breath sounds clear to auscultation        Cardiovascular Rhythm:Regular Rate:Normal     Neuro/Psych    GI/Hepatic negative GI ROS, Neg liver ROS,   Endo/Other  Type 2Diet controlled  Renal/GU negative Renal ROS     Musculoskeletal negative musculoskeletal ROS (+) Arthritis -,   Abdominal   Peds  Hematology negative hematology ROS (+)   Anesthesia Other Findings   Reproductive/Obstetrics                         Anesthesia Physical Anesthesia Plan  ASA: III  Anesthesia Plan: General   Post-op Pain Management:    Induction: Intravenous  Airway Management Planned: Oral ETT  Additional Equipment:   Intra-op Plan:   Post-operative Plan: Extubation in OR  Informed Consent: I have reviewed the patients History and Physical, chart, labs and discussed the procedure including the risks, benefits and alternatives for the proposed anesthesia with the patient or authorized representative who has indicated his/her understanding and acceptance.   Dental advisory given  Plan Discussed with: CRNA, Anesthesiologist and Surgeon  Anesthesia Plan Comments:         Anesthesia Quick Evaluation

## 2012-05-07 NOTE — Preoperative (Signed)
Beta Blockers   Reason not to administer Beta Blockers:Not Applicable 

## 2012-05-08 ENCOUNTER — Encounter (HOSPITAL_COMMUNITY): Payer: Self-pay | Admitting: Orthopedic Surgery

## 2012-05-08 NOTE — Op Note (Signed)
NAMECHRISTAN, CICCARELLI               ACCOUNT NO.:  [id number]  MEDICAL RECORD NO.:  [id number]  LOCATION:  [facility]                         FACILITY:  [facility]  PHYSICIAN:  Estill Bamberg, MD      DATE OF BIRTH:  [birth year]  DATE OF PROCEDURE:  05/07/2012 DATE OF DISCHARGE:  05/07/2012                              OPERATIVE REPORT   PREOPERATIVE DIAGNOSIS:  L4-5 spinal stenosis resulting in bilateral lower extremity leg pain.  POSTOPERATIVE DIAGNOSIS:  L4-5 spinal stenosis resulting in bilateral lower extremity leg pain.  PROCEDURES:  L4-5 laminectomy, partial facetectomy with bilateral foraminotomy.  SURGEON:  Estill Bamberg, MD  ASSISTANT:  None.  ANESTHESIA:  General endotracheal anesthesia.  COMPLICATIONS:  None.  DISPOSITION:  Stable.  ESTIMATED BLOOD LOSS:  Minimal.  INDICATIONS FOR PROCEDURE:  Briefly, Ms. Infantino is an extremely pleasant 61 year old female, who initially presented to me on Mar 06, 2012, with severe bilateral leg pain with the right side being worse than the left.  She did have symptoms for approximately 5 years.  She did go on to have multiple forms of conservative care and did continue to have pain.  Of note, she did have an epidural injection, which did entirely alleviate her pain temporarily.  Given the patient's failure of nonoperative measures, I did have a discussion with the patient regarding going forward with an L4-5 decompression.  The patient fully understood the risks and limitations of the procedure as outlined in my preoperative note.  OPERATIVE DETAILS:  On May 07, 2012, the patient was brought to Surgery and general endotracheal anesthesia was administered.  The patient was placed prone on a well-padded flat Jackson bed with a Wilson frame.  All bony prominences were meticulously padded.  Antibiotics were given.  A time-out procedure was performed.  I then placed two 18-gauge spinal needles over the midline of the spine.  Then, a lateral  intraoperative radiograph did confirm the appropriate levels.  I then made an incision approximately 3 cm in length centered over the L5-S1 interspace.  The fascia was sharply incised and the paraspinal musculature was bluntly swept laterally.  The lamina of L4 and L5 was readily identified and subperiosteally exposed, and I did identify the pars interarticularis of L4 and L5.  I then obtained an additional lateral intraoperative radiograph to confirm the appropriate operative level.  I then went forward with decompression.  The inferior aspect of the L4 spinous process was removed.  The ligamentum flavum was then removed, then I went forward with a laminectomy and partial facetectomy on both the right and the left sides.  Overgrowth of the ligamentum flavum was removed on both the right and the left sides, and a thorough neuroforaminal decompression was confirmed using a Public house manager. The procedure was uncomplicated.  There was no extravasation of cerebrospinal fluid.  Epidural bleeding was controlled using bipolar electrocautery in addition to SurgiFlo.  A 40 mg of Depo-Medrol was then infiltrated in the epidural space.  I then closed the fascia using #1 Vicryl.  The subcutaneous layer was closed using 2-0 Vicryl and the skin was closed using 4-0 Monocryl.  Benzoin and Steri-Strips were applied followed by sterile dressing.  All  instrument counts were correct at the termination of the procedure.     Estill Bamberg, MD     MD/MEDQ  D:  05/07/2012  T:  05/08/2012  Job:  [id number]  cc:   Lucky Cowboy, M.D.

## 2013-02-17 ENCOUNTER — Other Ambulatory Visit (HOSPITAL_COMMUNITY): Payer: Self-pay | Admitting: Physician Assistant

## 2013-02-17 ENCOUNTER — Ambulatory Visit (HOSPITAL_COMMUNITY)
Admission: RE | Admit: 2013-02-17 | Discharge: 2013-02-17 | Disposition: A | Discharge status: Home / Self Care | Payer: 59 | Source: Ambulatory Visit | Attending: Physician Assistant | Admitting: Physician Assistant

## 2013-02-17 DIAGNOSIS — I10 Essential (primary) hypertension: Secondary | ICD-10-CM

## 2013-04-05 ENCOUNTER — Other Ambulatory Visit (HOSPITAL_COMMUNITY): Payer: Self-pay | Admitting: Physician Assistant

## 2013-04-05 DIAGNOSIS — Z1231 Encounter for screening mammogram for malignant neoplasm of breast: Secondary | ICD-10-CM

## 2013-04-30 ENCOUNTER — Ambulatory Visit (HOSPITAL_COMMUNITY)
Admission: RE | Admit: 2013-04-30 | Discharge: 2013-04-30 | Disposition: A | Discharge status: Home / Self Care | Payer: 59 | Source: Ambulatory Visit | Attending: Physician Assistant | Admitting: Physician Assistant

## 2013-04-30 DIAGNOSIS — Z1231 Encounter for screening mammogram for malignant neoplasm of breast: Secondary | ICD-10-CM | POA: Insufficient documentation

## 2013-08-06 ENCOUNTER — Encounter: Payer: Self-pay | Admitting: Internal Medicine

## 2013-08-06 DIAGNOSIS — G2581 Restless legs syndrome: Secondary | ICD-10-CM | POA: Insufficient documentation

## 2013-08-06 DIAGNOSIS — E1169 Type 2 diabetes mellitus with other specified complication: Secondary | ICD-10-CM | POA: Insufficient documentation

## 2013-08-06 DIAGNOSIS — E119 Type 2 diabetes mellitus without complications: Secondary | ICD-10-CM

## 2013-08-06 DIAGNOSIS — E559 Vitamin D deficiency, unspecified: Secondary | ICD-10-CM

## 2013-08-06 DIAGNOSIS — E785 Hyperlipidemia, unspecified: Secondary | ICD-10-CM

## 2013-08-19 ENCOUNTER — Ambulatory Visit: Payer: 59 | Admitting: Physician Assistant

## 2013-08-19 ENCOUNTER — Encounter: Payer: Self-pay | Admitting: Physician Assistant

## 2013-08-19 VITALS — BP 128/78 | HR 68 | Temp 98.7°F | Resp 16 | Wt 167.0 lb

## 2013-08-19 DIAGNOSIS — I10 Essential (primary) hypertension: Secondary | ICD-10-CM

## 2013-08-19 DIAGNOSIS — E119 Type 2 diabetes mellitus without complications: Secondary | ICD-10-CM

## 2013-08-19 DIAGNOSIS — E559 Vitamin D deficiency, unspecified: Secondary | ICD-10-CM

## 2013-08-19 DIAGNOSIS — K219 Gastro-esophageal reflux disease without esophagitis: Secondary | ICD-10-CM

## 2013-08-19 DIAGNOSIS — E785 Hyperlipidemia, unspecified: Secondary | ICD-10-CM

## 2013-08-19 HISTORY — DX: Essential (primary) hypertension: I10

## 2013-08-19 LAB — BASIC METABOLIC PANEL WITH GFR
Calcium: 9.6 mg/dL (ref 8.4–10.5)
GFR, Est African American: 75 mL/min
GFR, Est Non African American: 65 mL/min
Potassium: 4.3 mEq/L (ref 3.5–5.3)
Sodium: 141 mEq/L (ref 135–145)

## 2013-08-19 LAB — CBC WITH DIFFERENTIAL/PLATELET
Basophils Absolute: 0 10*3/uL (ref 0.0–0.1)
Basophils Relative: 0 % (ref 0–1)
Hemoglobin: 14.6 g/dL (ref 12.0–15.0)
MCHC: 34.8 g/dL (ref 30.0–36.0)
Neutro Abs: 2.8 10*3/uL (ref 1.7–7.7)
Neutrophils Relative %: 54 % (ref 43–77)
Platelets: 171 10*3/uL (ref 150–400)
RDW: 13.7 % (ref 11.5–15.5)

## 2013-08-19 LAB — VITAMIN D 25 HYDROXY (VIT D DEFICIENCY, FRACTURES): Vit D, 25-Hydroxy: 69 ng/mL (ref 30–89)

## 2013-08-19 LAB — HEPATIC FUNCTION PANEL
Bilirubin, Direct: 0.1 mg/dL (ref 0.0–0.3)
Indirect Bilirubin: 0.2 mg/dL (ref 0.0–0.9)

## 2013-08-19 LAB — HEMOGLOBIN A1C: Mean Plasma Glucose: 140 mg/dL — ABNORMAL HIGH (ref <117)

## 2013-08-19 LAB — LIPID PANEL
Cholesterol: 167 mg/dL (ref 0–200)
HDL: 33 mg/dL — ABNORMAL LOW (ref >39)
LDL Cholesterol: 87 mg/dL (ref 0–99)
Triglycerides: 235 mg/dL — ABNORMAL HIGH (ref <150)
VLDL: 47 mg/dL — ABNORMAL HIGH (ref 0–40)

## 2013-08-19 MED ORDER — ESOMEPRAZOLE MAGNESIUM 40 MG PO CPDR: 40.0000 mg | DELAYED_RELEASE_CAPSULE | Freq: Every day | ORAL | Status: DC

## 2013-08-19 NOTE — Patient Instructions (Signed)

## 2013-08-19 NOTE — Progress Notes (Signed)
HPI Patient presents for 6 month follow up with hypertension, hyperlipidemia, prediabetes and vitamin D. Patient's blood pressure has been controlled at home. Patient denies chest pain, Dizziness. DOE for last few months with hills which is a change, without accompaniments. Has woken up once with a very localized pain in the center of her chest.   Patient's cholesterol is diet controlled. she has been working on diet and exercise for prediabetes, denies changes in vision, polys, and paresthesias.   Patient is on Vitamin D supplement.  Current Medications:    Medication List       This list is accurate as of: 08/19/13  9:00 AM.  Always use your most recent med list.               AZO TABS PO  Take 2 tablets by mouth daily.     CINNAMON PO  Take 1,000 mg by mouth daily.     CRANBERRY EXTRACT PO  Take 1 tablet by mouth daily.     rOPINIRole 1 MG tablet  Commonly known as:  REQUIP  Take 1 mg by mouth at bedtime.     UNABLE TO FIND  Lite Salt 2-3 times a day     VITAMIN D (CHOLECALCIFEROL) PO  Take 4,000 Units by mouth daily.       Allergies:  Allergies  Allergen Reactions  . Hctz [Hydrochlorothiazide]     Leg cramping  . Metformin And Related     ROS Constitutional: Denies fever, chills, weight loss/gain, headaches, insomnia, fatigue, night sweats, and change in appetite. Eyes: Denies redness, blurred vision, diplopia, discharge, itchy, watery eyes.  ENT: Denies discharge, congestion, post nasal drip, sore throat, earache, dental pain, Tinnitus, Vertigo, Sinus pain, snoring.  Cardio: One episode of chest pain, atypical well localized. Denies  palpitations, irregular heartbeat,  diaphoresis, orthopnea, PND, claudication, edema Respiratory: + DOE with exertion last few months with no accompaniments denies cough,,pleurisy, hoarseness, wheezing.  Gastrointestinal: Denies dysphagia, heartburn,  water brash, pain, cramps, nausea, vomiting, bloating, diarrhea, constipation,  hematemesis, melena, hematochezia,  hemorrhoids Genitourinary: Denies dysuria, frequency, urgency, nocturia, hesitancy, discharge, hematuria, flank pain Musculoskeletal: Denies arthralgia, myalgia, stiffness, Jt. Swelling, pain, limp, and strain/sprain. Skin: Denies puritis, rash, hives, warts, acne, eczema, changing in skin lesion Neuro: Weakness, tremor, incoordination, spasms, paresthesia, pain Psychiatric: Denies confusion, memory loss, sensory loss Endocrine: Denies change in weight, skin, hair change, nocturia, and paresthesia, Diabetic Polys, visual blurring, hyper /hypo glycemic episodes.  Heme/Lymph: Excessive bleeding, bruising, enlarged lymph nodes Medical History:  Past Medical History  Diagnosis Date  . Arthritis     hips  . Restless leg syndrome   . EKG abnormalities     pt states she had a BBB on her last EKG  . Snoring   . Diet-controlled type 2 diabetes mellitus     Diet controlled  . Diverticulosis   . Dribbling urine   . Hyperlipidemia   . Vitamin D deficiency    Family history- Review and unchanged Social history- Review and unchanged Physical Exam: Filed Vitals:   08/19/13 0847  BP: 128/78  Pulse: 68  Temp: 98.7 F (37.1 C)  Resp: 16   General Appearance: Well nourished, in no apparent distress. Eyes: PERRLA, EOMs, conjunctiva no swelling or erythema, normal fundi and vessels. Sinuses: No Frontal/maxillary tenderness ENT/Mouth: Ext aud canals clear, with TMs without erythema, bulging.No erythema, swelling, or exudate on post pharynx.  Tonsils not swollen or erythematous. Hearing normal.  Neck: Supple, thyroid normal.  Respiratory: Respiratory  effort normal, BS equal bilaterally without rales, rhonci, wheezing or stridor.  Cardio: Heart sounds normal, regular rate and rhythm without murmurs, rubs or gallops. Peripheral pulses brisk and equal bilaterally, without edema.  Abdomen: Flat, soft, with bowel sounds. + epigastric tenderness with no guarding,  rebound, hernias, masses, or organomegaly.  Lymphatics: Non tender without lymphadenopathy.  Musculoskeletal: Full ROM all peripheral extremities, joint stability, 5/5 strength, and normal gait. Skin: Warm, dry without rashes, lesions, ecchymosis.  Neuro: Cranial nerves intact, reflexes equal bilaterally. Normal muscle tone, no cerebellar symptoms. Sensation intact.  Pysch: Awake and oriented X 3, normal affect, Insight and Judgment appropriate.   Assessement and Plan: Hypertension: Continue to monitor blood pressure at home. Continue DASH diet. Cholesterol: Continue diet and exercise. Check cholesterol.  Diabetes-Continue diet and exercise. Check A1C Atypical chest pain once with DOE on a hill, no other accompaniments, low risks and with reflux and epigastric pain- will treat for GERD for 2 weeks and see if sx's improve.   If it happens again with accompaniments or does not go away then go to ER.  Vitamin D Def- check level and continue medications.   Quentin Mulling 9:00 AM

## 2013-08-20 ENCOUNTER — Telehealth: Payer: Self-pay

## 2013-08-20 LAB — INSULIN, FASTING: Insulin fasting, serum: 28 u[IU]/mL (ref 3–28)

## 2013-08-20 NOTE — Telephone Encounter (Signed)
Patient aware of lab results and instructions

## 2013-08-20 NOTE — Telephone Encounter (Signed)
Message copied by Linwood Dibbles on Fri Aug 20, 2013 11:16 AM ------      Message from: Quentin Mulling R      Created: Fri Aug 20, 2013  8:38 AM       BMP, CBC, LFTs, TSH, insulin and Vitamin D are all normal. Your A1C is in the diabetic range at 6.5. Your A1C is a measure of your sugar over the past 3 months and is not affected by what you have eaten over the past few days. Diabetes increases your chances of stroke and heart attack over 300 % and is the leading cause of blindness and kidney failure in the [place]. Please make sure you decrease bad carbs like white bread, white rice, potatoes, corn, soft drinks, pasta, cereals, refined sugars, sweet tea, dried fruits, and fruit juice. Good carbs are okay to eat in moderation like sweet potatoes, brown rice, whole grain pasta/bread, most fruit (expect dried fruit) and you can eat as many veggies as you want. Your LDL goal is less than 70 and your HDL goal is greater than 50. Your LDL is not at goal. Your LDL is the bad cholesterol that can lead to heart attack and stroke. To lower your number you can decrease your fatty foods, red meat, cheese, milk and increase fiber like      whole grains and veggies. You can also add a fiber supplement like Metamucil or Benefiber. To increase your HDL increase walking and add more veggies.                    ------

## 2014-02-22 ENCOUNTER — Ambulatory Visit (INDEPENDENT_AMBULATORY_CARE_PROVIDER_SITE_OTHER): Payer: 59 | Admitting: Physician Assistant

## 2014-02-22 ENCOUNTER — Encounter: Payer: Self-pay | Admitting: Physician Assistant

## 2014-02-22 VITALS — BP 120/80 | HR 72 | Temp 98.1°F | Resp 16 | Ht 65.0 in | Wt 166.0 lb

## 2014-02-22 DIAGNOSIS — I10 Essential (primary) hypertension: Secondary | ICD-10-CM

## 2014-02-22 DIAGNOSIS — G2581 Restless legs syndrome: Secondary | ICD-10-CM

## 2014-02-22 DIAGNOSIS — Z0000 Encounter for general adult medical examination without abnormal findings: Secondary | ICD-10-CM

## 2014-02-22 DIAGNOSIS — E785 Hyperlipidemia, unspecified: Secondary | ICD-10-CM

## 2014-02-22 DIAGNOSIS — E559 Vitamin D deficiency, unspecified: Secondary | ICD-10-CM

## 2014-02-22 DIAGNOSIS — E119 Type 2 diabetes mellitus without complications: Secondary | ICD-10-CM

## 2014-02-22 LAB — URINALYSIS, ROUTINE W REFLEX MICROSCOPIC
Bilirubin Urine: NEGATIVE
Glucose, UA: NEGATIVE mg/dL
Hgb urine dipstick: NEGATIVE
Ketones, ur: NEGATIVE mg/dL
LEUKOCYTES UA: NEGATIVE
NITRITE: NEGATIVE
Protein, ur: NEGATIVE mg/dL
SPECIFIC GRAVITY, URINE: 1.009 (ref 1.005–1.030)
Urobilinogen, UA: 0.2 mg/dL (ref 0.0–1.0)
pH: 5.5 (ref 5.0–8.0)

## 2014-02-22 LAB — CBC WITH DIFFERENTIAL/PLATELET
Basophils Absolute: 0 10*3/uL (ref 0.0–0.1)
Basophils Relative: 0 % (ref 0–1)
EOS ABS: 0.1 10*3/uL (ref 0.0–0.7)
EOS PCT: 2 % (ref 0–5)
HEMATOCRIT: 42.5 % (ref 36.0–46.0)
Hemoglobin: 14.7 g/dL (ref 12.0–15.0)
LYMPHS ABS: 2.6 10*3/uL (ref 0.7–4.0)
LYMPHS PCT: 40 % (ref 12–46)
MCH: 29.8 pg (ref 26.0–34.0)
MCHC: 34.6 g/dL (ref 30.0–36.0)
MCV: 86 fL (ref 78.0–100.0)
MONO ABS: 0.5 10*3/uL (ref 0.1–1.0)
Monocytes Relative: 7 % (ref 3–12)
Neutro Abs: 3.3 10*3/uL (ref 1.7–7.7)
Neutrophils Relative %: 51 % (ref 43–77)
Platelets: 180 10*3/uL (ref 150–400)
RBC: 4.94 MIL/uL (ref 3.87–5.11)
RDW: 13.8 % (ref 11.5–15.5)
WBC: 6.5 10*3/uL (ref 4.0–10.5)

## 2014-02-22 LAB — HEMOGLOBIN A1C
Hgb A1c MFr Bld: 6.6 % — ABNORMAL HIGH (ref <5.7)
Mean Plasma Glucose: 143 mg/dL — ABNORMAL HIGH (ref <117)

## 2014-02-22 MED ORDER — FLUCONAZOLE 150 MG PO TABS: 150.0000 mg | ORAL_TABLET | Freq: Every day | ORAL | Status: DC

## 2014-02-22 NOTE — Patient Instructions (Addendum)
Please start 100 of Invokana for 1 week then go up to 300mg  of Invokana. This can decrease your blood pressure so please contact us if you have any dizziness. You are peeing out 300-400 calories a day of sugar which can lead to yeast infections, you can take the diflucan as needed but if the infections continue we will stop the medications. We will have you follow up in 1 month to check your kidney function and weight. Call if you need anything.   Preventative Care for Adults - Female      MAINTAIN REGULAR HEALTH EXAMS:  A routine yearly physical is a good way to check in with your primary care provider about your health and preventive screening. It is also an opportunity to share updates about your health and any concerns you have, and receive a thorough all-over exam.   Most health insurance companies pay for at least some preventative services.  Check with your health plan for specific coverages.  WHAT PREVENTATIVE SERVICES DO WOMEN NEED?  Adult women should have their weight and blood pressure checked regularly.   Women age 58 and older should have their cholesterol levels checked regularly.  Women should be screened for cervical cancer with a Pap smear and pelvic exam beginning at either age 10, or 3 years after they become sexually activity.    Breast cancer screening generally begins at age 21 with a mammogram and breast exam by your primary care provider.    Beginning at age 87 and continuing to age 65, women should be screened for colorectal cancer.  Certain people may need continued testing until age 49.  Updating vaccinations is part of preventative care.  Vaccinations help protect against diseases such as the flu.  Osteoporosis is a disease in which the bones lose minerals and strength as we age. Women ages 49 and over should discuss this with their caregivers, as should women after menopause who have other risk factors.  Lab tests are generally done as part of preventative care  to screen for anemia and blood disorders, to screen for problems with the kidneys and liver, to screen for bladder problems, to check blood sugar, and to check your cholesterol level.  Preventative services generally include counseling about diet, exercise, avoiding tobacco, drugs, excessive alcohol consumption, and sexually transmitted infections.    GENERAL RECOMMENDATIONS FOR GOOD HEALTH:  Healthy diet:  Eat a variety of foods, including fruit, vegetables, animal or vegetable protein, such as meat, fish, chicken, and eggs, or beans, lentils, tofu, and grains, such as rice.  Drink plenty of water daily.  Decrease saturated fat in the diet, avoid lots of red meat, processed foods, sweets, fast foods, and fried foods.  Exercise:  Aerobic exercise helps maintain good heart health. At least 30-40 minutes of moderate-intensity exercise is recommended. For example, a brisk walk that increases your heart rate and breathing. This should be done on most days of the week.   Find a type of exercise or a variety of exercises that you enjoy so that it becomes a part of your daily life.  Examples are running, walking, swimming, water aerobics, and biking.  For motivation and support, explore group exercise such as aerobic class, spin class, Zumba, Yoga,or  martial arts, etc.    Set exercise goals for yourself, such as a certain weight goal, walk or run in a race such as a 5k walk/run.  Speak to your primary care provider about exercise goals.  Disease prevention:  If you  smoke or chew tobacco, find out from your caregiver how to quit. It can literally save your life, no matter how long you have been a tobacco user. If you do not use tobacco, never begin.   Maintain a healthy diet and normal weight. Increased weight leads to problems with blood pressure and diabetes.   The Body Mass Index or BMI is a way of measuring how much of your body is fat. Having a BMI above 27 increases the risk of heart  disease, diabetes, hypertension, stroke and other problems related to obesity. Your caregiver can help determine your BMI and based on it develop an exercise and dietary program to help you achieve or maintain this important measurement at a healthful level.  High blood pressure causes heart and blood vessel problems.  Persistent high blood pressure should be treated with medicine if weight loss and exercise do not work.   Fat and cholesterol leaves deposits in your arteries that can block them. This causes heart disease and vessel disease elsewhere in your body.  If your cholesterol is found to be high, or if you have heart disease or certain other medical conditions, then you may need to have your cholesterol monitored frequently and be treated with medication.   Ask if you should have a cardiac stress test if your history suggests this. A stress test is a test done on a treadmill that looks for heart disease. This test can find disease prior to there being a problem.  Menopause can be associated with physical symptoms and risks. Hormone replacement therapy is available to decrease these. You should talk to your caregiver about whether starting or continuing to take hormones is right for you.   Osteoporosis is a disease in which the bones lose minerals and strength as we age. This can result in serious bone fractures. Risk of osteoporosis can be identified using a bone density scan. Women ages 76 and over should discuss this with their caregivers, as should women after menopause who have other risk factors. Ask your caregiver whether you should be taking a calcium supplement and Vitamin D, to reduce the rate of osteoporosis.   Avoid drinking alcohol in excess (more than two drinks per day).  Avoid use of street drugs. Do not share needles with anyone. Ask for professional help if you need assistance or instructions on stopping the use of alcohol, cigarettes, and/or drugs.  Brush your teeth twice a  day with fluoride toothpaste, and floss once a day. Good oral hygiene prevents tooth decay and gum disease. The problems can be painful, unattractive, and can cause other health problems. Visit your dentist for a routine oral and dental check up and preventive care every 6-12 months.   Look at your skin regularly.  Use a mirror to look at your back. Notify your caregivers of changes in moles, especially if there are changes in shapes, colors, a size larger than a pencil eraser, an irregular border, or development of new moles.  Safety:  Use seatbelts 100% of the time, whether driving or as a passenger.  Use safety devices such as hearing protection if you work in environments with loud noise or significant background noise.  Use safety glasses when doing any work that could send debris in to the eyes.  Use a helmet if you ride a bike or motorcycle.  Use appropriate safety gear for contact sports.  Talk to your caregiver about gun safety.  Use sunscreen with a SPF (or skin protection factor)  of 15 or greater.  Lighter skinned people are at a greater risk of skin cancer. Don't forget to also wear sunglasses in order to protect your eyes from too much damaging sunlight. Damaging sunlight can accelerate cataract formation.   Practice safe sex. Use condoms. Condoms are used for birth control and to help reduce the spread of sexually transmitted infections (or STIs).  Some of the STIs are gonorrhea (the clap), chlamydia, syphilis, trichomonas, herpes, HPV (human papilloma virus) and HIV (human immunodeficiency virus) which causes AIDS. The herpes, HIV and HPV are viral illnesses that have no cure. These can result in disability, cancer and death.   Keep carbon monoxide and smoke detectors in your home functioning at all times. Change the batteries every 6 months or use a model that plugs into the wall.   Vaccinations:  Stay up to date with your tetanus shots and other required immunizations. You should  have a booster for tetanus every 10 years. Be sure to get your flu shot every year, since 5%-20% of the [place] population comes down with the flu. The flu vaccine changes each year, so being vaccinated once is not enough. Get your shot in the fall, before the flu season peaks.   Other vaccines to consider:  Human Papilloma Virus or HPV causes cancer of the cervix, and other infections that can be transmitted from person to person. There is a vaccine for HPV, and females should get immunized between the ages of 34 and 81. It requires a series of 3 shots.   Pneumococcal vaccine to protect against certain types of pneumonia.  This is normally recommended for adults age 38 or older.  However, adults younger than 64 years old with certain underlying conditions such as diabetes, heart or lung disease should also receive the vaccine.  Shingles vaccine to protect against Varicella Zoster if you are older than age 36, or younger than 64 years old with certain underlying illness.  Hepatitis A vaccine to protect against a form of infection of the liver by a virus acquired from food.  Hepatitis B vaccine to protect against a form of infection of the liver by a virus acquired from blood or body fluids, particularly if you work in health care.  If you plan to travel internationally, check with your local health department for specific vaccination recommendations.  Cancer Screening:  Breast cancer screening is essential to preventive care for women. All women age 59 and older should perform a breast self-exam every month. At age 13 and older, women should have their caregiver complete a breast exam each year. Women at ages 2 and older should have a mammogram (x-ray film) of the breasts. Your caregiver can discuss how often you need mammograms.    Cervical cancer screening includes taking a Pap smear (sample of cells examined under a microscope) from the cervix (end of the uterus). It also includes testing for HPV  (Human Papilloma Virus, which can cause cervical cancer). Screening and a pelvic exam should begin at age 63, or 3 years after a woman becomes sexually active. Screening should occur every year, with a Pap smear but no HPV testing, up to age 48. After age 74, you should have a Pap smear every 3 years with HPV testing, if no HPV was found previously.   Most routine colon cancer screening begins at the age of 76. On a yearly basis, doctors may provide special easy to use take-home tests to check for hidden blood in the stool. Sigmoidoscopy  or colonoscopy can detect the earliest forms of colon cancer and is life saving. These tests use a small camera at the end of a tube to directly examine the colon. Speak to your caregiver about this at age 39, when routine screening begins (and is repeated every 5 years unless early forms of pre-cancerous polyps or small growths are found).    Plantar Fasciitis (Heel Spur Syndrome) with Rehab The plantar fascia is a fibrous, ligament-like, soft-tissue structure that spans the bottom of the foot. Plantar fasciitis is a condition that causes pain in the foot due to inflammation of the tissue. SYMPTOMS   Pain and tenderness on the underneath side of the foot.  Pain that worsens with standing or walking. CAUSES  Plantar fasciitis is caused by irritation and injury to the plantar fascia on the underneath side of the foot. Common mechanisms of injury include:  Direct trauma to bottom of the foot.  Damage to a small nerve that runs under the foot where the main fascia attaches to the heel bone.  Stress placed on the plantar fascia due to bone spurs. RISK INCREASES WITH:   Activities that place stress on the plantar fascia (running, jumping, pivoting, or cutting).  Poor strength and flexibility.  Improperly fitted shoes.  Tight calf muscles.  Flat feet.  Failure to warm-up properly before activity.  Obesity. PREVENTION  Warm up and stretch properly  before activity.  Allow for adequate recovery between workouts.  Maintain physical fitness:  Strength, flexibility, and endurance.  Cardiovascular fitness.  Maintain a health body weight.  Avoid stress on the plantar fascia.  Wear properly fitted shoes, including arch supports for individuals who have flat feet. PROGNOSIS  If treated properly, then the symptoms of plantar fasciitis usually resolve without surgery. However, occasionally surgery is necessary. RELATED COMPLICATIONS   Recurrent symptoms that may result in a chronic condition.  Problems of the lower back that are caused by compensating for the injury, such as limping.  Pain or weakness of the foot during push-off following surgery.  Chronic inflammation, scarring, and partial or complete fascia tear, occurring more often from repeated injections. TREATMENT  Treatment initially involves the use of ice and medication to help reduce pain and inflammation. The use of strengthening and stretching exercises may help reduce pain with activity, especially stretches of the Achilles tendon. These exercises may be performed at home or with a therapist. Your caregiver may recommend that you use heel cups of arch supports to help reduce stress on the plantar fascia. Occasionally, corticosteroid injections are given to reduce inflammation. If symptoms persist for greater than 6 months despite non-surgical (conservative), then surgery may be recommended.  MEDICATION   If pain medication is necessary, then nonsteroidal anti-inflammatory medications, such as aspirin and ibuprofen, or other minor pain relievers, such as acetaminophen, are often recommended.  Do not take pain medication within 7 days before surgery.  Prescription pain relievers may be given if deemed necessary by your caregiver. Use only as directed and only as much as you need.  Corticosteroid injections may be given by your caregiver. These injections should be  reserved for the most serious cases, because they may only be given a certain number of times. HEAT AND COLD  Cold treatment (icing) relieves pain and reduces inflammation. Cold treatment should be applied for 10 to 15 minutes every 2 to 3 hours for inflammation and pain and immediately after any activity that aggravates your symptoms. Use ice packs or massage the area with  a piece of ice (ice massage).  Heat treatment may be used prior to performing the stretching and strengthening activities prescribed by your caregiver, physical therapist, or athletic trainer. Use a heat pack or soak the injury in warm water. SEEK IMMEDIATE MEDICAL CARE IF:  Treatment seems to offer no benefit, or the condition worsens.  Any medications produce adverse side effects. EXERCISES RANGE OF MOTION (ROM) AND STRETCHING EXERCISES - Plantar Fasciitis (Heel Spur Syndrome) These exercises may help you when beginning to rehabilitate your injury. Your symptoms may resolve with or without further involvement from your physician, physical therapist or athletic trainer. While completing these exercises, remember:   Restoring tissue flexibility helps normal motion to return to the joints. This allows healthier, less painful movement and activity.  An effective stretch should be held for at least 30 seconds.  A stretch should never be painful. You should only feel a gentle lengthening or release in the stretched tissue. RANGE OF MOTION - Toe Extension, Flexion  Sit with your right / left leg crossed over your opposite knee.  Grasp your toes and gently pull them back toward the top of your foot. You should feel a stretch on the bottom of your toes and/or foot.  Hold this stretch for __________ seconds.  Now, gently pull your toes toward the bottom of your foot. You should feel a stretch on the top of your toes and or foot.  Hold this stretch for __________ seconds. Repeat __________ times. Complete this stretch  __________ times per day.  RANGE OF MOTION - Ankle Dorsiflexion, Active Assisted  Remove shoes and sit on a chair that is preferably not on a carpeted surface.  Place right / left foot under knee. Extend your opposite leg for support.  Keeping your heel down, slide your right / left foot back toward the chair until you feel a stretch at your ankle or calf. If you do not feel a stretch, slide your bottom forward to the edge of the chair, while still keeping your heel down.  Hold this stretch for __________ seconds. Repeat __________ times. Complete this stretch __________ times per day.  STRETCH  Gastroc, Standing  Place hands on wall.  Extend right / left leg, keeping the front knee somewhat bent.  Slightly point your toes inward on your back foot.  Keeping your right / left heel on the floor and your knee straight, shift your weight toward the wall, not allowing your back to arch.  You should feel a gentle stretch in the right / left calf. Hold this position for __________ seconds. Repeat __________ times. Complete this stretch __________ times per day. STRETCH  Soleus, Standing  Place hands on wall.  Extend right / left leg, keeping the other knee somewhat bent.  Slightly point your toes inward on your back foot.  Keep your right / left heel on the floor, bend your back knee, and slightly shift your weight over the back leg so that you feel a gentle stretch deep in your back calf.  Hold this position for __________ seconds. Repeat __________ times. Complete this stretch __________ times per day. STRETCH  Gastrocsoleus, Standing  Note: This exercise can place a lot of stress on your foot and ankle. Please complete this exercise only if specifically instructed by your caregiver.   Place the ball of your right / left foot on a step, keeping your other foot firmly on the same step.  Hold on to the wall or a rail for balance.  Slowly lift your other foot, allowing your body  weight to press your heel down over the edge of the step.  You should feel a stretch in your right / left calf.  Hold this position for __________ seconds.  Repeat this exercise with a slight bend in your right / left knee. Repeat __________ times. Complete this stretch __________ times per day.  STRENGTHENING EXERCISES - Plantar Fasciitis (Heel Spur Syndrome)  These exercises may help you when beginning to rehabilitate your injury. They may resolve your symptoms with or without further involvement from your physician, physical therapist or athletic trainer. While completing these exercises, remember:   Muscles can gain both the endurance and the strength needed for everyday activities through controlled exercises.  Complete these exercises as instructed by your physician, physical therapist or athletic trainer. Progress the resistance and repetitions only as guided. STRENGTH - Towel Curls  Sit in a chair positioned on a non-carpeted surface.  Place your foot on a towel, keeping your heel on the floor.  Pull the towel toward your heel by only curling your toes. Keep your heel on the floor.  If instructed by your physician, physical therapist or athletic trainer, add ____________________ at the end of the towel. Repeat __________ times. Complete this exercise __________ times per day. STRENGTH - Ankle Inversion  Secure one end of a rubber exercise band/tubing to a fixed object (table, pole). Loop the other end around your foot just before your toes.  Place your fists between your knees. This will focus your strengthening at your ankle.  Slowly, pull your big toe up and in, making sure the band/tubing is positioned to resist the entire motion.  Hold this position for __________ seconds.  Have your muscles resist the band/tubing as it slowly pulls your foot back to the starting position. Repeat __________ times. Complete this exercises __________ times per day.  Document Released:  09/30/2005 Document Revised: 12/23/2011 Document Reviewed: 01/12/2009 [facility] Patient Information 2014 [place], [place].

## 2014-02-22 NOTE — Progress Notes (Signed)
Complete Physical  Assessment and Plan: Arthritis- controlled  Restless leg syndrome- continue requip  Snoring- denies OSA and does not want sleep study  Diet-controlled type 2 diabetes mellitus- can not do MF- will try invokana samples Discussed general issues about diabetes pathophysiology and management., Educational material distributed., Suggested low cholesterol diet., Encouraged aerobic exercise., Discussed foot care., Reminded to get yearly retinal exam.  Diverticulosis- controlled, increase fiber  Hyperlipidemia--continue medications, check lipids, decrease fatty foods, increase activity.   Vitamin D deficiency- check level  Essential hypertension, benign-- continue medications, DASH diet, exercise and monitor at home. Call if greater than 130/80.   Plantar fasciitis given information Discussed med's effects and SE's. Screening labs and tests as requested with regular follow-up as recommended.  HPI 63 y.o. female  presents for a complete physical. Her blood pressure has been controlled at home, today their BP is BP: 120/80 mmHg She does workout, walks more. She denies chest pain, shortness of breath, dizziness.  She is not on cholesterol medication and denies myalgias. Her cholesterol is at goal. The cholesterol last visit was:   Lab Results  Component Value Date   CHOL 167 08/19/2013   HDL 33* 08/19/2013   LDLCALC 87 08/19/2013   TRIG 235* 08/19/2013   CHOLHDL 5.1 08/19/2013   She has been working on diet and exercise for diabetes but could not tolerate the metformin, and denies paresthesia of the feet, polydipsia, polyuria and visual disturbances. Last A1C in the office was:  Lab Results  Component Value Date   HGBA1C 6.5* 08/19/2013   Patient is on Vitamin D supplement.   Wt Readings from Last 3 Encounters:  02/22/14 166 lb (75.297 kg)  08/19/13 167 lb (75.751 kg)  05/04/12 167 lb 15.9 oz (76.2 kg)   She complains of right heel pain for 3 months, does not recall injury.  Worse when she has been walking all day, has some numbness in big toe but this have been unchanged.   Current Medications:  Current Outpatient Prescriptions on File Prior to Visit  Medication Sig Dispense Refill  . CINNAMON PO Take 1,000 mg by mouth daily.      Marland Kitchen CRANBERRY EXTRACT PO Take 1 tablet by mouth daily.        . Phenazopyridine HCl (AZO TABS PO) Take 2 tablets by mouth daily.      Marland Kitchen rOPINIRole (REQUIP) 1 MG tablet Take 1 mg by mouth at bedtime.        Marland Kitchen UNABLE TO FIND Lite Salt 2-3 times a day      . VITAMIN D, CHOLECALCIFEROL, PO Take 4,000 Units by mouth daily.        No current facility-administered medications on file prior to visit.   Health Maintenance:   Immunization History  Administered Date(s) Administered  . Tdap 03/07/2011   Tetanus: 2012 Pneumovax: N/A- declines until age 24 Flu vaccine: 2014 at work Zostavax: N/A Pap: 2011 normal declines another MGM: 04/2013 DEXA: 2008 normal Colonoscopy: 2012 due 5 years due 2017 EGD: N/A Eye exam: 09/2013  Allergies:  Allergies  Allergen Reactions  . Hctz [Hydrochlorothiazide]     Leg cramping  . Metformin And Related    Medical History:  Past Medical History  Diagnosis Date  . Arthritis     hips  . Restless leg syndrome   . EKG abnormalities     pt states she had a BBB on her last EKG  . Snoring   . Diet-controlled type 2 diabetes mellitus  Diet controlled  . Diverticulosis   . Dribbling urine   . Hyperlipidemia   . Vitamin D deficiency   . Essential hypertension, benign 08/19/2013   Surgical History:  Past Surgical History  Procedure Laterality Date  . Vaginal hysterectomy  1992  . Tubal ligation  1981  . Ovarian cyst removal  1981    right  . Breast biopsy  1969    left  . Colonoscopy    . Lumbar laminectomy/decompression microdiscectomy  05/07/2012    Procedure: LUMBAR LAMINECTOMY/DECOMPRESSION MICRODISCECTOMY;  Surgeon: Sinclair Ship, MD;  Location: [facility];  Service: Orthopedics;   Laterality: Bilateral;  Lumbar 4-5 decompression  . Eye surgery Bilateral     cataracts   Family History:  Family History  Problem Relation Age of Onset  . Hypertension Mother   . Diabetes Mother   . Stroke Mother   . Heart failure Mother   . Colon cancer Mother 41  . Stomach cancer Neg Hx   . Esophageal cancer Neg Hx   . Heart failure Father 59  . Stroke Sister 25   Social History:  History  Substance Use Topics  . Smoking status: Never Smoker   . Smokeless tobacco: Never Used  . Alcohol Use: No    Review of Systems: [X]  = complains of  [ ]  = denies  General: Fatigue [ ]  Fever [ ]  Chills [ ]  Weakness [ ]   Insomnia [ ] Weight change [ ]  Night sweats [ ]   Change in appetite [ ]  Eyes: Redness [ ]  Blurred vision [ ]  Diplopia [ ]  Discharge [ ]   ENT: Congestion [ ]  Sinus Pain [ ]  Post Nasal Drip [ ]  Sore Throat [ ]  Earache [ ]  hearing loss [ ]  Tinnitus [ ]  Snoring Valu.Nieves ]  Cardiac: Chest pain/pressure [ ]  SOB [ ]  Orthopnea [ ]   Palpitations [ ]   Paroxysmal nocturnal dyspnea[ ]  Claudication [ ]  Edema [ ]   Pulmonary: Cough [ ]  Wheezing[ ]   SOB [ ]   Pleurisy [ ]   GI: Nausea [ ]  Vomiting[ ]  Dysphagia[ ]  Heartburn[ ]  Abdominal pain [ ]  Constipation [ ] ; Diarrhea [ ]  BRBPR [ ]  Melena[ ]  Bloating [ ]  Hemorrhoids [ ]   GU: Hematuria[ ]  Dysuria [ ]  Nocturia[ ]  Urgency [ ]   Hesitancy [ ]  Discharge [ ]  Frequency [ ]   Breast:  Breast lumps [ ]   nipple discharge [ ]    Neuro: Headaches[ ]  Vertigo[ ]  Paresthesias[ ]  Spasm [ ]  Speech changes [ ]  Incoordination [ ]   Ortho: Arthritis [ ]  Joint pain Valu.Nieves ] Muscle pain [ ]  Joint swelling [ ]  Back Pain [ ]  Skin:  Rash [ ]   Pruritis [ ]  Change in skin lesion [ ]   Psych: Depression[ ]  Anxiety[ ]  Confusion [ ]  Memory loss [ ]   Heme/Lypmh: Bleeding [ ]  Bruising [ ]  Enlarged lymph nodes [ ]   Endocrine: Visual blurring [ ]  Paresthesia [ ]  Polyuria [ ]  Polydypsea [ ]    Heat/cold intolerance [ ]  Hypoglycemia [ ]   Physical Exam: Estimated body mass index is 27.62  kg/(m^2) as calculated from the following:   Height as of this encounter: 5\' 5"  (1.651 m).   Weight as of this encounter: 166 lb (75.297 kg). BP 120/80  Pulse 72  Temp(Src) 98.1 F (36.7 C)  Resp 16  Ht 5\' 5"  (1.651 m)  Wt 166 lb (75.297 kg)  BMI 27.62 kg/m2 General Appearance: Well nourished, in no apparent distress. Eyes: PERRLA, EOMs, conjunctiva no  swelling or erythema, normal fundi and vessels. Sinuses: No Frontal/maxillary tenderness ENT/Mouth: Ext aud canals clear, normal light reflex with TMs without erythema, bulging.  Good dentition. No erythema, swelling, or exudate on post pharynx. Tonsils not swollen or erythematous. Hearing normal.  Neck: Supple, thyroid normal. No bruits Respiratory: Respiratory effort normal, BS equal bilaterally without rales, rhonchi, wheezing or stridor. Cardio: RRR without murmurs, rubs or gallops. Brisk peripheral pulses without edema.  Chest: symmetric, with normal excursions and percussion. Breasts: Symmetric, without lumps, nipple discharge, retractions. Abdomen: Soft, +BS, Non tender, no guarding, rebound, hernias, masses, or organomegaly. .  Lymphatics: Non tender without lymphadenopathy.  Genitourinary: defer Musculoskeletal: Full ROM all peripheral extremities,5/5 strength, and normal gait. Skin: Warm, dry without rashes, lesions, ecchymosis.  Neuro: Cranial nerves intact, reflexes equal bilaterally. Normal muscle tone, no cerebellar symptoms. Sensation intact.  Psych: Awake and oriented X 3, normal affect, Insight and Judgment appropriate.   EKG: WNL no changes, IRBBB AORTA SCAN: WNL   Vicie Mutters 2:10 PM

## 2014-02-23 LAB — HEPATIC FUNCTION PANEL
ALK PHOS: 85 U/L (ref 39–117)
ALT: 18 U/L (ref 0–35)
AST: 19 U/L (ref 0–37)
Albumin: 4.5 g/dL (ref 3.5–5.2)
BILIRUBIN TOTAL: 0.4 mg/dL (ref 0.2–1.2)
Bilirubin, Direct: 0.1 mg/dL (ref 0.0–0.3)
Indirect Bilirubin: 0.3 mg/dL (ref 0.2–1.2)
TOTAL PROTEIN: 7.5 g/dL (ref 6.0–8.3)

## 2014-02-23 LAB — LIPID PANEL
CHOL/HDL RATIO: 4.4 ratio
CHOLESTEROL: 175 mg/dL (ref 0–200)
HDL: 40 mg/dL (ref >39)
LDL Cholesterol: 106 mg/dL — ABNORMAL HIGH (ref 0–99)
Triglycerides: 144 mg/dL (ref <150)
VLDL: 29 mg/dL (ref 0–40)

## 2014-02-23 LAB — MICROALBUMIN / CREATININE URINE RATIO
Creatinine, Urine: 38.3 mg/dL
Microalb Creat Ratio: 13.1 mg/g (ref 0.0–30.0)
Microalb, Ur: 0.5 mg/dL (ref 0.00–1.89)

## 2014-02-23 LAB — MAGNESIUM: Magnesium: 2 mg/dL (ref 1.5–2.5)

## 2014-02-23 LAB — TSH: TSH: 1.317 u[IU]/mL (ref 0.350–4.500)

## 2014-02-23 LAB — IRON AND TIBC
%SAT: 19 % — ABNORMAL LOW (ref 20–55)
Iron: 66 ug/dL (ref 42–145)
TIBC: 355 ug/dL (ref 250–470)
UIBC: 289 ug/dL (ref 125–400)

## 2014-02-23 LAB — BASIC METABOLIC PANEL WITH GFR
BUN: 19 mg/dL (ref 6–23)
CALCIUM: 9.6 mg/dL (ref 8.4–10.5)
CHLORIDE: 103 meq/L (ref 96–112)
CO2: 28 meq/L (ref 19–32)
Creat: 1.01 mg/dL (ref 0.50–1.10)
GFR, Est African American: 68 mL/min
GFR, Est Non African American: 59 mL/min — ABNORMAL LOW
GLUCOSE: 117 mg/dL — AB (ref 70–99)
Potassium: 4.2 mEq/L (ref 3.5–5.3)
Sodium: 141 mEq/L (ref 135–145)

## 2014-02-23 LAB — INSULIN, FASTING: Insulin fasting, serum: 17 u[IU]/mL (ref 3–28)

## 2014-02-23 LAB — VITAMIN D 25 HYDROXY (VIT D DEFICIENCY, FRACTURES): VIT D 25 HYDROXY: 75 ng/mL (ref 30–89)

## 2014-02-23 LAB — VITAMIN B12: Vitamin B-12: 461 pg/mL (ref 211–911)

## 2014-03-29 ENCOUNTER — Other Ambulatory Visit: Payer: Self-pay | Admitting: Physician Assistant

## 2014-03-29 DIAGNOSIS — Z1231 Encounter for screening mammogram for malignant neoplasm of breast: Secondary | ICD-10-CM

## 2014-03-30 ENCOUNTER — Encounter: Payer: Self-pay | Admitting: Physician Assistant

## 2014-03-30 ENCOUNTER — Ambulatory Visit (INDEPENDENT_AMBULATORY_CARE_PROVIDER_SITE_OTHER): Payer: 59 | Admitting: Physician Assistant

## 2014-03-30 VITALS — BP 130/80 | HR 68 | Temp 97.9°F | Resp 16 | Wt 170.0 lb

## 2014-03-30 DIAGNOSIS — G2581 Restless legs syndrome: Secondary | ICD-10-CM

## 2014-03-30 DIAGNOSIS — E119 Type 2 diabetes mellitus without complications: Secondary | ICD-10-CM

## 2014-03-30 MED ORDER — ROPINIROLE HCL 0.5 MG PO TABS
0.5000 mg | ORAL_TABLET | Freq: Three times a day (TID) | ORAL | Status: DC
Start: 2014-03-30 — End: 2014-07-06

## 2014-03-30 MED ORDER — SAXAGLIPTIN HCL 5 MG PO TABS: 5.0000 mg | ORAL_TABLET | Freq: Every day | ORAL | Status: DC

## 2014-03-30 NOTE — Patient Instructions (Signed)

## 2014-03-30 NOTE — Progress Notes (Signed)
HPI 63 y.o.female presents for follow up of new start on Invokana, she was unable to tolerate metformin. She states she had reflux, diarrhea and felt bad with it and she states it did not seem to affect her BP or sugars. She only had 20 pills worth and stopped.  Lab Results  Component Value Date   HGBA1C 6.6* 02/22/2014    Past Medical History  Diagnosis Date  . Arthritis     hips  . Restless leg syndrome   . EKG abnormalities     pt states she had a BBB on her last EKG  . Snoring   . Diet-controlled type 2 diabetes mellitus     Diet controlled  . Diverticulosis   . Dribbling urine   . Hyperlipidemia   . Vitamin D deficiency   . Essential hypertension, benign 08/19/2013     Allergies  Allergen Reactions  . Hctz [Hydrochlorothiazide]     Leg cramping  . Metformin And Related       Current Outpatient Prescriptions on File Prior to Visit  Medication Sig Dispense Refill  . CINNAMON PO Take 1,000 mg by mouth daily.      Marland Kitchen CRANBERRY EXTRACT PO Take 1 tablet by mouth daily.        . fluconazole (DIFLUCAN) 150 MG tablet Take 1 tablet (150 mg total) by mouth daily.  1 tablet  3  . Phenazopyridine HCl (AZO TABS PO) Take 2 tablets by mouth daily.      Marland Kitchen UNABLE TO FIND Lite Salt 2-3 times a day      . VITAMIN D, CHOLECALCIFEROL, PO Take 4,000 Units by mouth daily.        No current facility-administered medications on file prior to visit.    ROS: all negative expect above.   Physical: Filed Weights   03/30/14 1456  Weight: 170 lb (77.111 kg)   BP 130/80  Pulse 68  Temp(Src) 97.9 F (36.6 C)  Resp 16  Wt 170 lb (77.111 kg) Wt Readings from Last 3 Encounters:  03/30/14 170 lb (77.111 kg)  02/22/14 166 lb (75.297 kg)  08/19/13 167 lb (75.751 kg)   General Appearance: Well nourished, in no apparent distress. Eyes: PERRLA, EOMs. Sinuses: No Frontal/maxillary tenderness ENT/Mouth: Ext aud canals clear, normal light reflex with TMs without erythema, bulging. Post pharynx  without erythema, swelling, exudate.  Respiratory: CTAB Cardio: RRR, no murmurs, rubs or gallops. Peripheral pulses brisk and equal bilaterally, without edema. No aortic or femoral bruits. Abdomen: Soft, with bowl sounds. Nontender, no guarding, rebound. Lymphatics: Non tender without lymphadenopathy.  Musculoskeletal: Full ROM all peripheral extremities, 5/5 strength, and normal gait. Skin: Warm, dry without rashes, lesions, ecchymosis.  Neuro: Cranial nerves intact, reflexes equal bilaterally. Normal muscle tone, no cerebellar symptoms. Sensation intact.  Pysch: Awake and oriented X 3, normal affect, Insight and Judgment appropriate.   Assessment and Plan: DM- willing to try onglyza, will give samples and give a written prescription with card for her to try.  Discussed general issues about diabetes pathophysiology and management., Educational material distributed., Suggested low cholesterol diet., Encouraged aerobic exercise., Discussed foot care., Reminded to get yearly retinal exam.  RLS- refill requip

## 2014-04-26 ENCOUNTER — Encounter: Payer: Self-pay | Admitting: Internal Medicine

## 2014-05-03 ENCOUNTER — Ambulatory Visit (HOSPITAL_COMMUNITY)
Admission: RE | Admit: 2014-05-03 | Discharge: 2014-05-03 | Disposition: A | Discharge status: Home / Self Care | Payer: 59 | Source: Ambulatory Visit | Attending: Physician Assistant | Admitting: Physician Assistant

## 2014-05-03 DIAGNOSIS — Z1231 Encounter for screening mammogram for malignant neoplasm of breast: Secondary | ICD-10-CM | POA: Insufficient documentation

## 2014-07-06 ENCOUNTER — Encounter: Payer: Self-pay | Admitting: Physician Assistant

## 2014-07-06 ENCOUNTER — Ambulatory Visit (INDEPENDENT_AMBULATORY_CARE_PROVIDER_SITE_OTHER): Payer: 59 | Admitting: Physician Assistant

## 2014-07-06 VITALS — BP 120/68 | HR 68 | Temp 98.7°F | Resp 16 | Ht 65.0 in | Wt 168.0 lb

## 2014-07-06 DIAGNOSIS — G2581 Restless legs syndrome: Secondary | ICD-10-CM

## 2014-07-06 DIAGNOSIS — Z79899 Other long term (current) drug therapy: Secondary | ICD-10-CM

## 2014-07-06 DIAGNOSIS — E785 Hyperlipidemia, unspecified: Secondary | ICD-10-CM

## 2014-07-06 DIAGNOSIS — E559 Vitamin D deficiency, unspecified: Secondary | ICD-10-CM

## 2014-07-06 DIAGNOSIS — I10 Essential (primary) hypertension: Secondary | ICD-10-CM

## 2014-07-06 DIAGNOSIS — E119 Type 2 diabetes mellitus without complications: Secondary | ICD-10-CM

## 2014-07-06 LAB — CBC WITH DIFFERENTIAL/PLATELET
Basophils Absolute: 0 10*3/uL (ref 0.0–0.1)
Basophils Relative: 0 % (ref 0–1)
EOS ABS: 0.2 10*3/uL (ref 0.0–0.7)
EOS PCT: 3 % (ref 0–5)
HCT: 41.3 % (ref 36.0–46.0)
HEMOGLOBIN: 14.2 g/dL (ref 12.0–15.0)
LYMPHS ABS: 2.4 10*3/uL (ref 0.7–4.0)
LYMPHS PCT: 42 % (ref 12–46)
MCH: 29.9 pg (ref 26.0–34.0)
MCHC: 34.4 g/dL (ref 30.0–36.0)
MCV: 86.9 fL (ref 78.0–100.0)
MONOS PCT: 8 % (ref 3–12)
Monocytes Absolute: 0.5 10*3/uL (ref 0.1–1.0)
NEUTROS PCT: 47 % (ref 43–77)
Neutro Abs: 2.7 10*3/uL (ref 1.7–7.7)
PLATELETS: 172 10*3/uL (ref 150–400)
RBC: 4.75 MIL/uL (ref 3.87–5.11)
RDW: 13.9 % (ref 11.5–15.5)
WBC: 5.8 10*3/uL (ref 4.0–10.5)

## 2014-07-06 MED ORDER — ROPINIROLE HCL 0.5 MG PO TABS
0.5000 mg | ORAL_TABLET | Freq: Three times a day (TID) | ORAL | Status: DC
Start: 2014-07-06 — End: 2015-07-03

## 2014-07-06 NOTE — Progress Notes (Signed)
Assessment and Plan:  Hypertension: Continue medication, monitor blood pressure at home. Continue DASH diet. Cholesterol: Continue diet and exercise. Check cholesterol.  Diabetes-Continue diet and exercise. Check A1C Vitamin D Def- check level and continue medications.  Right hip pain- no bursa pain- suggest follow up with Dr. Lynann Bologna, likely LBP and may benefit from injection ? Diverticulitis- no rebound tenderness- check labs- does not want ABX  Continue diet and meds as discussed. Further disposition pending results of labs. Discussed med's effects and SE's.    HPI 63 y.o. female  presents for 3 month follow up with hypertension, hyperlipidemia, diabetes and vitamin D. Her blood pressure has been controlled at home, today their BP is BP: 120/68 mmHg She does not workout. She denies chest pain, shortness of breath, dizziness.  She is not on cholesterol medication and denies myalgias. Her cholesterol is at goal. The cholesterol last visit was:   Lab Results  Component Value Date   CHOL 175 02/22/2014   HDL 40 02/22/2014   LDLCALC 106* 02/22/2014   TRIG 144 02/22/2014   CHOLHDL 4.4 02/22/2014   She has been working on diet and exercise for Diabetes, she states she was unable to tolerate the onglyza, metformin, or invokana, and denies polydipsia, polyuria and visual disturbances. Last A1C in the office was:  Lab Results  Component Value Date   HGBA1C 6.6* 02/22/2014   Patient is on Vitamin D supplement. Lab Results  Component Value Date   VD25OH 44 02/22/2014     She is on Requip for RLS which helps her.  She has a history of diverticulosis, she states this past week she was at the beach and she ate some things she shouldn't have and started to have LLQ pain, no diarrhea. This has improved.  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 She has seen Dr. Lynann Bologna in the past for L4-5 decompression in 2013. Some numbness in right foot  but this is unchanged, no radiation down.  Current Medications:  Current Outpatient Prescriptions on File Prior to Visit  Medication Sig Dispense Refill  . CINNAMON PO Take 1,000 mg by mouth daily.      Marland Kitchen CRANBERRY EXTRACT PO Take 1 tablet by mouth daily.        . fluconazole (DIFLUCAN) 150 MG tablet Take 1 tablet (150 mg total) by mouth daily.  1 tablet  3  . Multiple Vitamins-Minerals (MULTIVITAMIN PO) Take by mouth daily.      . Phenazopyridine HCl (AZO TABS PO) Take 2 tablets by mouth daily.      Marland Kitchen rOPINIRole (REQUIP) 0.5 MG tablet Take 1 tablet (0.5 mg total) by mouth 3 (three) times daily.  90 tablet  3  . UNABLE TO FIND Lite Salt 2-3 times a day      . VITAMIN D, CHOLECALCIFEROL, PO Take 4,000 Units by mouth daily.        No current facility-administered medications on file prior to visit.   Medical History:  Past Medical History  Diagnosis Date  . Arthritis     hips  . Restless leg syndrome   . EKG abnormalities     pt states she had a BBB on her last EKG  . Snoring   . Diet-controlled type 2 diabetes mellitus     Diet controlled  . Diverticulosis   . Dribbling urine   . Hyperlipidemia   . Vitamin D deficiency   .  Essential hypertension, benign 08/19/2013   Allergies:  Allergies  Allergen Reactions  . Hctz [Hydrochlorothiazide]     Leg cramping  . Metformin And Related      Review of Systems: [X]  = complains of  [ ]  = denies  General: Fatigue [ ]  Fever [ ]  Chills [ ]  Weakness [ ]   Insomnia [ ]  Eyes: Redness [ ]  Blurred vision [ ]  Diplopia [ ]   ENT: Congestion [ ]  Sinus Pain [ ]  Post Nasal Drip [ ]  Sore Throat [ ]  Earache [ ]   Cardiac: Chest pain/pressure [ ]  SOB [ ]  Orthopnea [ ]   Palpitations [ ]   Paroxysmal nocturnal dyspnea[ ]  Claudication [ ]  Edema [ ]   Pulmonary: Cough [ ]  Wheezing[ ]   SOB [ ]   Snoring [ ]   GI: Nausea [ ]  Vomiting[ ]  Dysphagia[ ]  Heartburn[ ]  Abdominal pain [ ]  Constipation [ ] ; Diarrhea [ ] ; BRBPR [ ]  Melena[ ]  GU: Hematuria[ ]  Dysuria [ ]   Nocturia[ ]  Urgency [ ]   Hesitancy [ ]  Discharge [ ]  Neuro: Headaches[ ]  Vertigo[ ]  Paresthesias[ ]  Spasm [ ]  Speech changes [ ]  Incoordination [ ]   Ortho: Arthritis [ ]  Joint pain [ ]  Muscle pain [ ]  Joint swelling [ ]  Back Pain [ ]  Skin:  Rash [ ]   Pruritis [ ]  Change in skin lesion [ ]   Psych: Depression[ ]  Anxiety[ ]  Confusion [ ]  Memory loss [ ]   Heme/Lypmh: Bleeding [ ]  Bruising [ ]  Enlarged lymph nodes [ ]   Endocrine: Visual blurring [ ]  Paresthesia [ ]  Polyuria [ ]  Polydypsea [ ]    Heat/cold intolerance [ ]  Hypoglycemia [ ]   Family history- Review and unchanged Social history- Review and unchanged Physical Exam: BP 120/68  Pulse 68  Temp(Src) 98.7 F (37.1 C)  Resp 16  Ht 5\' 5"  (1.651 m)  Wt 168 lb (76.204 kg)  BMI 27.96 kg/m2 Wt Readings from Last 3 Encounters:  07/06/14 168 lb (76.204 kg)  03/30/14 170 lb (77.111 kg)  02/22/14 166 lb (75.297 kg)   General Appearance: Well nourished, in no apparent distress. Eyes: PERRLA, EOMs, conjunctiva no swelling or erythema Sinuses: No Frontal/maxillary tenderness ENT/Mouth: Ext aud canals clear, TMs without erythema, bulging. No erythema, swelling, or exudate on post pharynx.  Tonsils not swollen or erythematous. Hearing normal.  Neck: Supple, thyroid normal.  Respiratory: Respiratory effort normal, BS equal bilaterally without rales, rhonchi, wheezing or stridor.  Cardio: RRR with no MRGs. Brisk peripheral pulses without edema.  Abdomen: Soft, + BS.  Non tender, no guarding, rebound, hernias, masses. Lymphatics: Non tender without lymphadenopathy.  Musculoskeletal: Full ROM, 5/5 strength, normal gait.  Skin: Warm, dry without rashes, lesions, ecchymosis.  Neuro: Cranial nerves intact. No cerebellar symptoms. Sensation intact.  Psych: Awake and oriented X 3, normal affect, Insight and Judgment appropriate.    Vicie Mutters 3:17 PM

## 2014-07-06 NOTE — Patient Instructions (Signed)
   Bad carbs also include fruit juice, alcohol, and sweet tea. These are empty calories that do not signal to your brain that you are full.   Please remember the good carbs are still carbs which convert into sugar. So please measure them out no more than 1/2-1 cup of rice, oatmeal, pasta, and beans.  Veggies are however free foods! Pile them on.   I like lean protein at every meal such as chicken, turkey, pork chops, cottage cheese, etc. Just do not fry these meats and please center your meal around vegetable, the meats should be a side dish.   No all fruit is created equal. Please see the list below, the fruit at the bottom is higher in sugars than the fruit at the top   Recommendations For Diabetic Patients:   -  Take medications as prescribed  -  Recommend Dr Joel Fuhrman's book "The End of Diabetes " - Can get at  www.Amazon.com and encourage also get the Audio CD book  - AVOID Animal products, ie. Meat - red/white, Poultry and Dairy/especially cheese - Exercise at least 5 times a week for 30 minutes or preferably daily.  - No Smoking - Drink less than 2 drinks a day.  - Monitor your feet for sores - Have yearly Eye Exams - Recommend annual Flu vaccine  - Recommend Pneumovax and Prevnar vaccines - Shingles Vaccine (Zostavax) if over 60 y.o.  Goals:   - BMI less than 24 - Fasting sugar less than 130 or less than 150 if tapering medicines to lose weight  - Systolic BP less than 130  - Diastolic BP less than 80 - Bad LDL Cholesterol less than 70 - Triglycerides less than 150  

## 2014-07-07 LAB — LIPID PANEL
CHOL/HDL RATIO: 3.9 ratio
CHOLESTEROL: 154 mg/dL (ref 0–200)
HDL: 39 mg/dL — ABNORMAL LOW (ref >39)
LDL Cholesterol: 81 mg/dL (ref 0–99)
Triglycerides: 172 mg/dL — ABNORMAL HIGH (ref <150)
VLDL: 34 mg/dL (ref 0–40)

## 2014-07-07 LAB — BASIC METABOLIC PANEL WITH GFR
BUN: 17 mg/dL (ref 6–23)
CALCIUM: 9.3 mg/dL (ref 8.4–10.5)
CO2: 25 meq/L (ref 19–32)
Chloride: 104 mEq/L (ref 96–112)
Creat: 0.96 mg/dL (ref 0.50–1.10)
GFR, Est African American: 73 mL/min
GFR, Est Non African American: 63 mL/min
GLUCOSE: 91 mg/dL (ref 70–99)
Potassium: 4.2 mEq/L (ref 3.5–5.3)
SODIUM: 139 meq/L (ref 135–145)

## 2014-07-07 LAB — HEPATIC FUNCTION PANEL
ALT: 18 U/L (ref 0–35)
AST: 18 U/L (ref 0–37)
Albumin: 4.1 g/dL (ref 3.5–5.2)
Alkaline Phosphatase: 64 U/L (ref 39–117)
BILIRUBIN DIRECT: 0.1 mg/dL (ref 0.0–0.3)
BILIRUBIN TOTAL: 0.4 mg/dL (ref 0.2–1.2)
Indirect Bilirubin: 0.3 mg/dL (ref 0.2–1.2)
Total Protein: 7.3 g/dL (ref 6.0–8.3)

## 2014-07-07 LAB — MAGNESIUM: MAGNESIUM: 1.9 mg/dL (ref 1.5–2.5)

## 2014-07-07 LAB — INSULIN, FASTING: Insulin fasting, serum: 6.4 u[IU]/mL (ref 2.0–19.6)

## 2014-07-07 LAB — HEMOGLOBIN A1C
Hgb A1c MFr Bld: 6.5 % — ABNORMAL HIGH (ref <5.7)
Mean Plasma Glucose: 140 mg/dL — ABNORMAL HIGH (ref <117)

## 2014-07-07 LAB — TSH: TSH: 1.913 u[IU]/mL (ref 0.350–4.500)

## 2014-07-07 LAB — VITAMIN D 25 HYDROXY (VIT D DEFICIENCY, FRACTURES): Vit D, 25-Hydroxy: 87 ng/mL (ref 30–89)

## 2014-07-20 ENCOUNTER — Encounter: Payer: Self-pay | Admitting: Internal Medicine

## 2014-07-21 ENCOUNTER — Other Ambulatory Visit: Payer: Self-pay | Admitting: Orthopedic Surgery

## 2014-07-21 DIAGNOSIS — M533 Sacrococcygeal disorders, not elsewhere classified: Principal | ICD-10-CM

## 2014-07-21 DIAGNOSIS — G8929 Other chronic pain: Secondary | ICD-10-CM

## 2014-07-29 ENCOUNTER — Other Ambulatory Visit: Payer: Self-pay

## 2014-08-04 ENCOUNTER — Ambulatory Visit
Admission: RE | Admit: 2014-08-04 | Discharge: 2014-08-04 | Disposition: A | Discharge status: Home / Self Care | Payer: 59 | Source: Ambulatory Visit | Attending: Orthopedic Surgery | Admitting: Orthopedic Surgery

## 2014-08-04 DIAGNOSIS — G8929 Other chronic pain: Secondary | ICD-10-CM

## 2014-08-04 DIAGNOSIS — M533 Sacrococcygeal disorders, not elsewhere classified: Principal | ICD-10-CM

## 2014-08-04 MED ORDER — METHYLPREDNISOLONE ACETATE 40 MG/ML INJ SUSP (RADIOLOG: 120.0000 mg | Freq: Once | INTRAMUSCULAR | Status: DC

## 2014-10-14 ENCOUNTER — Encounter: Payer: Self-pay | Admitting: *Deleted

## 2015-01-04 ENCOUNTER — Ambulatory Visit (INDEPENDENT_AMBULATORY_CARE_PROVIDER_SITE_OTHER): Payer: 59 | Admitting: Physician Assistant

## 2015-01-04 ENCOUNTER — Encounter: Payer: Self-pay | Admitting: Physician Assistant

## 2015-01-04 VITALS — BP 128/80 | HR 68 | Temp 97.9°F | Resp 16 | Ht 65.0 in | Wt 171.0 lb

## 2015-01-04 DIAGNOSIS — N182 Chronic kidney disease, stage 2 (mild): Secondary | ICD-10-CM

## 2015-01-04 DIAGNOSIS — I10 Essential (primary) hypertension: Secondary | ICD-10-CM

## 2015-01-04 DIAGNOSIS — Z79899 Other long term (current) drug therapy: Secondary | ICD-10-CM

## 2015-01-04 DIAGNOSIS — K2100 Gastro-esophageal reflux disease with esophagitis, without bleeding: Secondary | ICD-10-CM

## 2015-01-04 DIAGNOSIS — E559 Vitamin D deficiency, unspecified: Secondary | ICD-10-CM

## 2015-01-04 DIAGNOSIS — E1329 Other specified diabetes mellitus with other diabetic kidney complication: Secondary | ICD-10-CM

## 2015-01-04 DIAGNOSIS — G2581 Restless legs syndrome: Secondary | ICD-10-CM

## 2015-01-04 DIAGNOSIS — E785 Hyperlipidemia, unspecified: Secondary | ICD-10-CM

## 2015-01-04 DIAGNOSIS — E1365 Other specified diabetes mellitus with hyperglycemia: Secondary | ICD-10-CM

## 2015-01-04 DIAGNOSIS — IMO0002 Reserved for concepts with insufficient information to code with codable children: Secondary | ICD-10-CM

## 2015-01-04 DIAGNOSIS — E1322 Other specified diabetes mellitus with diabetic chronic kidney disease: Secondary | ICD-10-CM

## 2015-01-04 LAB — CBC WITH DIFFERENTIAL/PLATELET
Basophils Absolute: 0 10*3/uL (ref 0.0–0.1)
Basophils Relative: 0 % (ref 0–1)
EOS ABS: 0.1 10*3/uL (ref 0.0–0.7)
Eosinophils Relative: 2 % (ref 0–5)
HCT: 41.7 % (ref 36.0–46.0)
Hemoglobin: 14.1 g/dL (ref 12.0–15.0)
LYMPHS PCT: 42 % (ref 12–46)
Lymphs Abs: 2.2 10*3/uL (ref 0.7–4.0)
MCH: 29.5 pg (ref 26.0–34.0)
MCHC: 33.8 g/dL (ref 30.0–36.0)
MCV: 87.2 fL (ref 78.0–100.0)
MPV: 9.6 fL (ref 8.6–12.4)
Monocytes Absolute: 0.4 10*3/uL (ref 0.1–1.0)
Monocytes Relative: 8 % (ref 3–12)
NEUTROS PCT: 48 % (ref 43–77)
Neutro Abs: 2.5 10*3/uL (ref 1.7–7.7)
Platelets: 169 10*3/uL (ref 150–400)
RBC: 4.78 MIL/uL (ref 3.87–5.11)
RDW: 13.2 % (ref 11.5–15.5)
WBC: 5.3 10*3/uL (ref 4.0–10.5)

## 2015-01-04 NOTE — Progress Notes (Signed)
Assessment and Plan:  1. Hypertension -Continue medication, monitor blood pressure at home. Continue DASH diet.  Reminder to go to the ER if any CP, SOB, nausea, dizziness, severe HA, changes vision/speech, left arm numbness and tingling and jaw pain.  2. Cholesterol -Continue diet and exercise. Check cholesterol.   3. Diabetes with CKD stage 2 GFR 63 last visit -Continue diet and exercise. Check A1C  4. Vitamin D Def - check level and continue medications.   5. Edema Compression stockings, walking, weight loss advised.  No PND/orthpnea, lungs CTAB  Continue diet and meds as discussed. Further disposition pending results of labs. Over 30 minutes of exam, counseling, chart review, and critical decision making was performed  Future Appointments Date Time Provider [place]  04/06/2015 10:00 AM Vicie Mutters, PA-C [id number] None     HPI 64 y.o. female  presents for 3 month follow up on hypertension, cholesterol, prediabetes, and vitamin D deficiency.   Her blood pressure has been controlled at home, today their BP is BP: 128/80 mmHg  She does not workout. She denies chest pain, shortness of breath, dizziness.  She is not on cholesterol medication and denies myalgias. Her cholesterol is not at goal. The cholesterol last visit was:   Lab Results  Component Value Date   CHOL 154 07/06/2014   HDL 39* 07/06/2014   LDLCALC 81 07/06/2014   TRIG 172* 07/06/2014   CHOLHDL 3.9 07/06/2014   She has been working on diet and exercise for Diabetes with diabetic chronic kidney disease, she is on bASA, she is not on ACE/ARB, and denies paresthesia of the feet, polydipsia, polyuria and visual disturbances. Last A1C was:  Lab Results  Component Value Date   HGBA1C 6.5* 07/06/2014  Patient is on Vitamin D supplement.   Lab Results  Component Value Date   VD25OH 33 07/06/2014  Seeing Dr. Lynann Bologna for right hip/back pain and had an injection that helped for only 4 weeks, she is now  just doing PT at home.     Current Medications:  Current Outpatient Prescriptions on File Prior to Visit  Medication Sig Dispense Refill  . CINNAMON PO Take 1,000 mg by mouth daily.    Marland Kitchen CRANBERRY EXTRACT PO Take 1 tablet by mouth daily.      . fluconazole (DIFLUCAN) 150 MG tablet Take 1 tablet (150 mg total) by mouth daily. 1 tablet 3  . Multiple Vitamins-Minerals (MULTIVITAMIN PO) Take by mouth daily.    . Phenazopyridine HCl (AZO TABS PO) Take 2 tablets by mouth daily.    Marland Kitchen rOPINIRole (REQUIP) 0.5 MG tablet Take 1 tablet (0.5 mg total) by mouth 3 (three) times daily. 270 tablet 3  . UNABLE TO FIND Lite Salt 2-3 times a day    . VITAMIN D, CHOLECALCIFEROL, PO Take 4,000 Units by mouth daily.      No current facility-administered medications on file prior to visit.   Medical History:  Past Medical History  Diagnosis Date  . Arthritis     hips  . Restless leg syndrome   . EKG abnormalities     pt states she had a BBB on her last EKG  . Snoring   . Diet-controlled type 2 diabetes mellitus     Diet controlled  . Diverticulosis   . Dribbling urine   . Hyperlipidemia   . Vitamin D deficiency   . Essential hypertension, benign 08/19/2013   Allergies:  Allergies  Allergen Reactions  . Hctz [Hydrochlorothiazide]     Leg  cramping  . Metformin And Related      Review of Systems:  Review of Systems  Constitutional: Negative.   HENT: Negative.   Eyes: Negative.   Respiratory: Negative.  Negative for shortness of breath and wheezing.   Cardiovascular: Positive for leg swelling. Negative for chest pain, palpitations, orthopnea, claudication and PND.       No orthpnea, PND.   Gastrointestinal: Negative.   Genitourinary: Negative.   Musculoskeletal: Negative.   Skin: Negative.   Neurological: Negative.   Endo/Heme/Allergies: Negative.   Psychiatric/Behavioral: Negative.    Family history- Review and unchanged Social history- Review and unchanged Physical Exam: BP 128/80  mmHg  Pulse 68  Temp(Src) 97.9 F (36.6 C)  Resp 16  Ht 5\' 5"  (1.651 m)  Wt 171 lb (77.565 kg)  BMI 28.46 kg/m2 Wt Readings from Last 3 Encounters:  01/04/15 171 lb (77.565 kg)  07/06/14 168 lb (76.204 kg)  03/30/14 170 lb (77.111 kg)   General Appearance: Well nourished, in no apparent distress. Eyes: PERRLA, EOMs, conjunctiva no swelling or erythema Sinuses: No Frontal/maxillary tenderness ENT/Mouth: Ext aud canals clear, TMs without erythema, bulging. No erythema, swelling, or exudate on post pharynx.  Tonsils not swollen or erythematous. Hearing normal.  Neck: Supple, thyroid normal.  Respiratory: Respiratory effort normal, BS equal bilaterally without rales, rhonchi, wheezing or stridor.  Cardio: RRR with no MRGs. Brisk peripheral pulses with 1-2+ edema.  Abdomen: Soft, + BS,  Non tender, no guarding, rebound, hernias, masses. Lymphatics: Non tender without lymphadenopathy.  Musculoskeletal: Full ROM, 5/5 strength, Normal gait Skin: Warm, dry without rashes, lesions, ecchymosis.  Neuro: Cranial nerves intact. Normal muscle tone, no cerebellar symptoms. Psych: Awake and oriented X 3, normal affect, Insight and Judgment appropriate.    Vicie Mutters, PA-C 3:19 PM [facility]

## 2015-01-04 NOTE — Patient Instructions (Signed)
Diabetes is a very complicated disease...lets simplify it.  An easy way to look at it to understand the complications is if you think of the extra sugar floating in your blood stream as glass shards floating through your blood stream.    Diabetes affects your small vessels first: 1) The glass shards (sugar) scraps down the tiny blood vessels in your eyes and lead to diabetic retinopathy, the leading cause of blindness in the [name]. Diabetes is the leading cause of newly diagnosed adult (33 to 65 years of age) blindness in the [place].  2) The glass shards scratches down the tiny vessels of your legs leading to nerve damage called neuropathy and can lead to amputations of your feet. More than 60% of all non-traumatic amputations of lower limbs occur in people with diabetes.  3) Over time the small vessels in your brain are shredded and closed off, individually this does not cause any problems but over a long period of time many of the small vessels being blocked can lead to Vascular Dementia.   4) Your kidney's are a filter system and have a "net" that keeps certain things in the body and lets bad things out. Sugar shreds this net and leads to kidney damage and eventually failure. Decreasing the sugar that is destroying the net and certain blood pressure medications can help stop or decrease progression of kidney disease. Diabetes was the primary cause of kidney failure in 44 percent of all new cases in 2011.  5) Diabetes also destroys the small vessels in your penis that lead to erectile dysfunction. Eventually the vessels are so damaged that you may not be responsive to cialis or viagra.   Diabetes and your large vessels: Your larger vessels consist of your coronary arteries in your heart and the carotid vessels to your brain. Diabetes or even increased sugars put you at 300% increased risk of heart attack and stroke and this is why.. The sugar scrapes down your large blood vessels and your body  sees this as an internal injury and tries to repair itself. Just like you get a scab on your skin, your platelets will stick to the blood vessel wall trying to heal it. This is why we have diabetics on low dose aspirin daily, this prevents the platelets from sticking and can prevent plaque formation. In addition, your body takes cholesterol and tries to shove it into the open wound. This is why we want your LDL, or bad cholesterol, below 70.   The combination of platelets and cholesterol over 5-10 years forms plaque that can break off and cause a heart attack or stroke.   PLEASE REMEMBER:  Diabetes is preventable! Up to 59 percent of complications and morbidities among individuals with type 2 diabetes can be prevented, delayed, or effectively treated and minimized with regular visits to a health professional, appropriate monitoring and medication, and a healthy diet and lifestyle.     Bad carbs also include fruit juice, alcohol, and sweet tea. These are empty calories that do not signal to your brain that you are full.   Please remember the good carbs are still carbs which convert into sugar. So please measure them out no more than 1/2-1 cup of rice, oatmeal, pasta, and beans  Veggies are however free foods! Pile them on.   Not all fruit is created equal. Please see the list below, the fruit at the bottom is higher in sugars than the fruit at the top. Please avoid all dried fruits.  Edema Edema is an abnormal buildup of fluids in your bodytissues. Edema is somewhatdependent on gravity to pull the fluid to the lowest place in your body. That makes the condition more common in the legs and thighs (lower extremities). Painless swelling of the feet and ankles is common and becomes more likely as you get older. It is also common in looser tissues, like around your eyes.  When the affected area is squeezed, the fluid may move out of that spot and leave a dent for a few moments. This dent is  called pitting.  CAUSES  There are many possible causes of edema. Eating too much salt and being on your feet or sitting for a long time can cause edema in your legs and ankles. Hot weather may make edema worse. Common medical causes of edema include: 5. Heart failure. 6. Liver disease. 7. Kidney disease. 8. Weak blood vessels in your legs. 9. Cancer. 10. An injury. 11. Pregnancy. 12. Some medications. 13. Obesity. SYMPTOMS  Edema is usually painless.Your skin may look swollen or shiny.  DIAGNOSIS  Your health care provider may be able to diagnose edema by asking about your medical history and doing a physical exam. You may need to have tests such as X-rays, an electrocardiogram, or blood tests to check for medical conditions that may cause edema.  TREATMENT  Edema treatment depends on the cause. If you have heart, liver, or kidney disease, you need the treatment appropriate for these conditions. General treatment may include:  Elevation of the affected body part above the level of your heart.  Compression of the affected body part. Pressure from elastic bandages or support stockings squeezes the tissues and forces fluid back into the blood vessels. This keeps fluid from entering the tissues.  Restriction of fluid and salt intake.  Use of a water pill (diuretic). These medications are appropriate only for some types of edema. They pull fluid out of your body and make you urinate more often. This gets rid of fluid and reduces swelling, but diuretics can have side effects. Only use diuretics as directed by your health care provider. HOME CARE INSTRUCTIONS   Keep the affected body part above the level of your heart when you are lying down.   Do not sit still or stand for prolonged periods.   Do not put anything directly under your knees when lying down.  Do not wear constricting clothing or garters on your upper legs.   Exercise your legs to work the fluid back into your blood  vessels. This may help the swelling go down.   Wear elastic bandages or support stockings to reduce ankle swelling as directed by your health care provider.   Eat a low-salt diet to reduce fluid if your health care provider recommends it.   Only take medicines as directed by your health care provider. SEEK MEDICAL CARE IF:   Your edema is not responding to treatment.  You have heart, liver, or kidney disease and notice symptoms of edema.  You have edema in your legs that does not improve after elevating them.   You have sudden and unexplained weight gain. SEEK IMMEDIATE MEDICAL CARE IF:   You develop shortness of breath or chest pain.   You cannot breathe when you lie down.  You develop pain, redness, or warmth in the swollen areas.   You have heart, liver, or kidney disease and suddenly get edema.  You have a fever and your symptoms suddenly get worse. MAKE SURE  YOU:   Understand these instructions.  Will watch your condition.  Will get help right away if you are not doing well or get worse. Document Released: 09/30/2005 Document Revised: 02/14/2014 Document Reviewed: 07/23/2013 [facility] Patient Information 2015 [place], [place]. This information is not intended to replace advice given to you by your health care provider. Make sure you discuss any questions you have with your health care provider.

## 2015-01-05 LAB — BASIC METABOLIC PANEL WITH GFR
BUN: 18 mg/dL (ref 6–23)
CO2: 27 meq/L (ref 19–32)
Calcium: 9.3 mg/dL (ref 8.4–10.5)
Chloride: 102 mEq/L (ref 96–112)
Creat: 0.9 mg/dL (ref 0.50–1.10)
GFR, EST AFRICAN AMERICAN: 78 mL/min
GFR, Est Non African American: 68 mL/min
GLUCOSE: 98 mg/dL (ref 70–99)
POTASSIUM: 4.1 meq/L (ref 3.5–5.3)
Sodium: 140 mEq/L (ref 135–145)

## 2015-01-05 LAB — LIPID PANEL
Cholesterol: 163 mg/dL (ref 0–200)
HDL: 34 mg/dL — ABNORMAL LOW (ref >=46)
LDL Cholesterol: 93 mg/dL (ref 0–99)
Total CHOL/HDL Ratio: 4.8 Ratio
Triglycerides: 180 mg/dL — ABNORMAL HIGH (ref <150)
VLDL: 36 mg/dL (ref 0–40)

## 2015-01-05 LAB — HEPATIC FUNCTION PANEL
ALK PHOS: 72 U/L (ref 39–117)
ALT: 16 U/L (ref 0–35)
AST: 17 U/L (ref 0–37)
Albumin: 4.2 g/dL (ref 3.5–5.2)
Bilirubin, Direct: 0.1 mg/dL (ref 0.0–0.3)
Indirect Bilirubin: 0.2 mg/dL (ref 0.2–1.2)
TOTAL PROTEIN: 7.1 g/dL (ref 6.0–8.3)
Total Bilirubin: 0.3 mg/dL (ref 0.2–1.2)

## 2015-01-05 LAB — MAGNESIUM: Magnesium: 2 mg/dL (ref 1.5–2.5)

## 2015-01-05 LAB — HEMOGLOBIN A1C
Hgb A1c MFr Bld: 6.8 % — ABNORMAL HIGH (ref <5.7)
MEAN PLASMA GLUCOSE: 148 mg/dL — AB (ref <117)

## 2015-01-05 LAB — TSH: TSH: 1.641 u[IU]/mL (ref 0.350–4.500)

## 2015-01-05 LAB — INSULIN, FASTING: Insulin fasting, serum: 6.5 u[IU]/mL (ref 2.0–19.6)

## 2015-01-05 LAB — VITAMIN D 25 HYDROXY (VIT D DEFICIENCY, FRACTURES): Vit D, 25-Hydroxy: 60 ng/mL (ref 30–100)

## 2015-03-01 ENCOUNTER — Encounter: Payer: Self-pay | Admitting: Physician Assistant

## 2015-04-06 ENCOUNTER — Other Ambulatory Visit (HOSPITAL_COMMUNITY)
Admission: RE | Admit: 2015-04-06 | Discharge: 2015-04-06 | Disposition: A | Discharge status: Home / Self Care | Payer: 59 | Source: Ambulatory Visit | Attending: Physician Assistant | Admitting: Physician Assistant

## 2015-04-06 ENCOUNTER — Ambulatory Visit (INDEPENDENT_AMBULATORY_CARE_PROVIDER_SITE_OTHER): Payer: 59 | Admitting: Physician Assistant

## 2015-04-06 ENCOUNTER — Encounter: Payer: Self-pay | Admitting: Physician Assistant

## 2015-04-06 VITALS — BP 138/80 | HR 68 | Temp 98.8°F | Resp 16 | Ht 65.0 in | Wt 170.0 lb

## 2015-04-06 DIAGNOSIS — K5730 Diverticulosis of large intestine without perforation or abscess without bleeding: Secondary | ICD-10-CM

## 2015-04-06 DIAGNOSIS — E1322 Other specified diabetes mellitus with diabetic chronic kidney disease: Secondary | ICD-10-CM

## 2015-04-06 DIAGNOSIS — E785 Hyperlipidemia, unspecified: Secondary | ICD-10-CM

## 2015-04-06 DIAGNOSIS — Z1159 Encounter for screening for other viral diseases: Secondary | ICD-10-CM

## 2015-04-06 DIAGNOSIS — N182 Chronic kidney disease, stage 2 (mild): Secondary | ICD-10-CM

## 2015-04-06 DIAGNOSIS — Z01411 Encounter for gynecological examination (general) (routine) with abnormal findings: Secondary | ICD-10-CM | POA: Diagnosis not present

## 2015-04-06 DIAGNOSIS — R87810 Cervical high risk human papillomavirus (HPV) DNA test positive: Secondary | ICD-10-CM | POA: Insufficient documentation

## 2015-04-06 DIAGNOSIS — Z0000 Encounter for general adult medical examination without abnormal findings: Secondary | ICD-10-CM

## 2015-04-06 DIAGNOSIS — M79672 Pain in left foot: Secondary | ICD-10-CM

## 2015-04-06 DIAGNOSIS — Z124 Encounter for screening for malignant neoplasm of cervix: Secondary | ICD-10-CM

## 2015-04-06 DIAGNOSIS — I10 Essential (primary) hypertension: Secondary | ICD-10-CM

## 2015-04-06 DIAGNOSIS — Z1151 Encounter for screening for human papillomavirus (HPV): Secondary | ICD-10-CM | POA: Diagnosis present

## 2015-04-06 DIAGNOSIS — Z79899 Other long term (current) drug therapy: Secondary | ICD-10-CM

## 2015-04-06 DIAGNOSIS — E1365 Other specified diabetes mellitus with hyperglycemia: Secondary | ICD-10-CM

## 2015-04-06 DIAGNOSIS — K2100 Gastro-esophageal reflux disease with esophagitis, without bleeding: Secondary | ICD-10-CM

## 2015-04-06 DIAGNOSIS — Z1212 Encounter for screening for malignant neoplasm of rectum: Secondary | ICD-10-CM

## 2015-04-06 DIAGNOSIS — E1143 Type 2 diabetes mellitus with diabetic autonomic (poly)neuropathy: Secondary | ICD-10-CM

## 2015-04-06 DIAGNOSIS — Z6828 Body mass index (BMI) 28.0-28.9, adult: Secondary | ICD-10-CM

## 2015-04-06 DIAGNOSIS — IMO0002 Reserved for concepts with insufficient information to code with codable children: Secondary | ICD-10-CM

## 2015-04-06 DIAGNOSIS — E559 Vitamin D deficiency, unspecified: Secondary | ICD-10-CM

## 2015-04-06 DIAGNOSIS — W57XXXA Bitten or stung by nonvenomous insect and other nonvenomous arthropods, initial encounter: Secondary | ICD-10-CM

## 2015-04-06 DIAGNOSIS — G2581 Restless legs syndrome: Secondary | ICD-10-CM

## 2015-04-06 LAB — CBC WITH DIFFERENTIAL/PLATELET
Basophils Absolute: 0 10*3/uL (ref 0.0–0.1)
Basophils Relative: 0 % (ref 0–1)
Eosinophils Absolute: 0.1 10*3/uL (ref 0.0–0.7)
Eosinophils Relative: 2 % (ref 0–5)
HCT: 41.8 % (ref 36.0–46.0)
Hemoglobin: 14 g/dL (ref 12.0–15.0)
Lymphocytes Relative: 36 % (ref 12–46)
Lymphs Abs: 2.3 10*3/uL (ref 0.7–4.0)
MCH: 29.1 pg (ref 26.0–34.0)
MCHC: 33.5 g/dL (ref 30.0–36.0)
MCV: 86.9 fL (ref 78.0–100.0)
MPV: 9.2 fL (ref 8.6–12.4)
Monocytes Absolute: 0.4 10*3/uL (ref 0.1–1.0)
Monocytes Relative: 6 % (ref 3–12)
Neutro Abs: 3.5 10*3/uL (ref 1.7–7.7)
Neutrophils Relative %: 56 % (ref 43–77)
Platelets: 174 10*3/uL (ref 150–400)
RBC: 4.81 MIL/uL (ref 3.87–5.11)
RDW: 13.8 % (ref 11.5–15.5)
WBC: 6.3 10*3/uL (ref 4.0–10.5)

## 2015-04-06 MED ORDER — LISINOPRIL 10 MG PO TABS: ORAL_TABLET | ORAL | Status: DC

## 2015-04-06 NOTE — Patient Instructions (Signed)
ACE inhibitors are blood pressure medications that protect your heart and kidneys. It can cause two symptoms: The most common symptom is a dry cough/tickle in your throat that can happen the first day you take it or 5 years after you have been taking it. Please call us if you have this and we can switch it to a different medications. The least common side effect is called angioedema which is swelling of your lips and tongue and can cause problems with your breathing. This is a very very rare side effect but very serious. If this happens please stop the medication and go to the ER.   Diabetes is a very complicated disease...lets simplify it.  An easy way to look at it to understand the complications is if you think of the extra sugar floating in your blood stream as glass shards floating through your blood stream.    Diabetes affects your small vessels first: 1) The glass shards (sugar) scraps down the tiny blood vessels in your eyes and lead to diabetic retinopathy, the leading cause of blindness in the [name]. Diabetes is the leading cause of newly diagnosed adult (42 to 65 years of age) blindness in the [place].  2) The glass shards scratches down the tiny vessels of your legs leading to nerve damage called neuropathy and can lead to amputations of your feet. More than 60% of all non-traumatic amputations of lower limbs occur in people with diabetes.  3) Over time the small vessels in your brain are shredded and closed off, individually this does not cause any problems but over a long period of time many of the small vessels being blocked can lead to Vascular Dementia.   4) Your kidney's are a filter system and have a "net" that keeps certain things in the body and lets bad things out. Sugar shreds this net and leads to kidney damage and eventually failure. Decreasing the sugar that is destroying the net and certain blood pressure medications can help stop or decrease progression of kidney  disease. Diabetes was the primary cause of kidney failure in 44 percent of all new cases in 2011.  5) Diabetes also destroys the small vessels in your penis that lead to erectile dysfunction. Eventually the vessels are so damaged that you may not be responsive to cialis or viagra.   Diabetes and your large vessels: Your larger vessels consist of your coronary arteries in your heart and the carotid vessels to your brain. Diabetes or even increased sugars put you at 300% increased risk of heart attack and stroke and this is why.. The sugar scrapes down your large blood vessels and your body sees this as an internal injury and tries to repair itself. Just like you get a scab on your skin, your platelets will stick to the blood vessel wall trying to heal it. This is why we have diabetics on low dose aspirin daily, this prevents the platelets from sticking and can prevent plaque formation. In addition, your body takes cholesterol and tries to shove it into the open wound. This is why we want your LDL, or bad cholesterol, below 70.   The combination of platelets and cholesterol over 5-10 years forms plaque that can break off and cause a heart attack or stroke.   PLEASE REMEMBER:  Diabetes is preventable! Up to 91 percent of complications and morbidities among individuals with type 2 diabetes can be prevented, delayed, or effectively treated and minimized with regular visits to a health professional, appropriate  monitoring and medication, and a healthy diet and lifestyle.  Diabetes is a very complicated disease...lets simplify it.  An easy way to look at it to understand the complications is if you think of the extra sugar floating in your blood stream as glass shards floating through your blood stream.    Diabetes affects your small vessels first: 1) The glass shards (sugar) scraps down the tiny blood vessels in your eyes and lead to diabetic retinopathy, the leading cause of blindness in the [name].  Diabetes is the leading cause of newly diagnosed adult (62 to 65 years of age) blindness in the [place].  2) The glass shards scratches down the tiny vessels of your legs leading to nerve damage called neuropathy and can lead to amputations of your feet. More than 60% of all non-traumatic amputations of lower limbs occur in people with diabetes.  3) Over time the small vessels in your brain are shredded and closed off, individually this does not cause any problems but over a long period of time many of the small vessels being blocked can lead to Vascular Dementia.   4) Your kidney's are a filter system and have a "net" that keeps certain things in the body and lets bad things out. Sugar shreds this net and leads to kidney damage and eventually failure. Decreasing the sugar that is destroying the net and certain blood pressure medications can help stop or decrease progression of kidney disease. Diabetes was the primary cause of kidney failure in 44 percent of all new cases in 2011.  5) Diabetes also destroys the small vessels in your penis that lead to erectile dysfunction. Eventually the vessels are so damaged that you may not be responsive to cialis or viagra.   Diabetes and your large vessels: Your larger vessels consist of your coronary arteries in your heart and the carotid vessels to your brain. Diabetes or even increased sugars put you at 300% increased risk of heart attack and stroke and this is why.. The sugar scrapes down your large blood vessels and your body sees this as an internal injury and tries to repair itself. Just like you get a scab on your skin, your platelets will stick to the blood vessel wall trying to heal it. This is why we have diabetics on low dose aspirin daily, this prevents the platelets from sticking and can prevent plaque formation. In addition, your body takes cholesterol and tries to shove it into the open wound. This is why we want your LDL, or bad cholesterol,  below 70.   The combination of platelets and cholesterol over 5-10 years forms plaque that can break off and cause a heart attack or stroke.   PLEASE REMEMBER:  Diabetes is preventable! Up to 32 percent of complications and morbidities among individuals with type 2 diabetes can be prevented, delayed, or effectively treated and minimized with regular visits to a health professional, appropriate monitoring and medication, and a healthy diet and lifestyle.   Before you even begin to attack a weight-loss plan, it pays to remember this: You are not fat. You have fat. Losing weight isn't about blame or shame; it's simply another achievement to accomplish. Dieting is like any other skill-you have to buckle down and work at it. As long as you act in a smart, reasonable way, you'll ultimately get where you want to be. Here are some weight loss pearls for you.  1. It's Not a Diet. It's a Lifestyle Thinking of a diet as  something you're on and suffering through only for the short term doesn't work. To shed weight and keep it off, you need to make permanent changes to the way you eat. It's OK to indulge occasionally, of course, but if you cut calories temporarily and then revert to your old way of eating, you'll gain back the weight quicker than you can say yo-yo. Use it to lose it. Research shows that one of the best predictors of long-term weight loss is how many pounds you drop in the first month. For that reason, nutritionists often suggest being stricter for the first two weeks of your new eating strategy to build momentum. Cut out added sugar and alcohol and avoid unrefined carbs. After that, figure out how you can reincorporate them in a way that's healthy and maintainable.  2. There's a Right Way to Exercise Working out burns calories and fat and boosts your metabolism by building muscle. But those trying to lose weight are notorious for overestimating the number of calories they burn and underestimating  the amount they take in. Unfortunately, your system is biologically programmed to hold on to extra pounds and that means when you start exercising, your body senses the deficit and ramps up its hunger signals. If you're not diligent, you'll eat everything you burn and then some. Use it to lose it. Cardio gets all the exercise glory, but strength and interval training are the real heroes. They help you build lean muscle, which in turn increases your metabolism and calorie-burning ability 3. Don't Overreact to Mild Hunger Some people have a hard time losing weight because of hunger anxiety. To them, being hungry is bad-something to be avoided at all costs-so they carry snacks with them and eat when they don't need to. Others eat because they're stressed out or bored. While you never want to get to the point of being ravenous (that's when bingeing is likely to happen), a hunger pang, a craving, or the fact that it's 3:00 p.m. should not send you racing for the vending machine or obsessing about the energy bar in your purse. Ideally, you should put off eating until your stomach is growling and it's difficult to concentrate.  Use it to lose it. When you feel the urge to eat, use the HALT method. Ask yourself, Am I really hungry? Or am I angry or anxious, lonely or bored, or tired? If you're still not certain, try the apple test. If you're truly hungry, an apple should seem delicious; if it doesn't, something else is going on. Or you can try drinking water and making yourself busy, if you are still hungry try a healthy snack.  4. Not All Calories Are Created Equal The mechanics of weight loss are pretty simple: Take in fewer calories than you use for energy. But the kind of food you eat makes all the difference. Processed food that's high in saturated fat and refined starch or sugar can cause inflammation that disrupts the hormone signals that tell your brain you're full. The result: You eat a lot more.  Use it to  lose it. Clean up your diet. Swap in whole, unprocessed foods, including vegetables, lean protein, and healthy fats that will fill you up and give you the biggest nutritional bang for your calorie buck. In a few weeks, as your brain starts receiving regular hunger and fullness signals once again, you'll notice that you feel less hungry overall and naturally start cutting back on the amount you eat.  5. Protein, Produce,  and Plant-Based Fats Are Your Weight-Loss Trinity Here's why eating the three Ps regularly will help you drop pounds. Protein fills you up. You need it to build lean muscle, which keeps your metabolism humming so that you can torch more fat. People in a weight-loss program who ate double the recommended daily allowance for protein (about 110 grams for a 150-pound woman) lost 70 percent of their weight from fat, while people who ate the RDA lost only about 40 percent, one study found. Produce is packed with filling fiber. "It's very difficult to consume too many calories if you're eating a lot of vegetables. Example: Three cups of broccoli is a lot of food, yet only 93 calories. (Fruit is another story. It can be easy to overeat and can contain a lot of calories from sugar, so be sure to monitor your intake.) Plant-based fats like olive oil and those in avocados and nuts are healthy and extra satiating.  Use it to lose it. Aim to incorporate each of the three Ps into every meal and snack. People who eat protein throughout the day are able to keep weight off, according to a study in the [place] of Clinical Nutrition. In addition to meat, poultry and seafood, good sources are beans, lentils, eggs, tofu, and yogurt. As for fat, keep portion sizes in check by measuring out salad dressing, oil, and nut butters (shoot for one to two tablespoons). Finally, eat veggies or a little fruit at every meal. People who did that consumed 308 fewer calories but didn't feel any hungrier than when they  didn't eat more produce.  7. How You Eat Is As Important As What You Eat In order for your brain to register that you're full, you need to focus on what you're eating. Sit down whenever you eat, preferably at a table. Turn off the TV or computer, put down your phone, and look at your food. Smell it. Chew slowly, and don't put another bite on your fork until you swallow. When women ate lunch this attentively, they consumed 30 percent less when snacking later than those who listened to an audiobook at lunchtime, according to a study in the [place] of Nutrition. 8. Weighing Yourself Really Works The scale provides the best evidence about whether your efforts are paying off. Seeing the numbers tick up or down or stagnate is motivation to keep going-or to rethink your approach. A 2015 study at [facility] found that daily weigh-ins helped people lose more weight, keep it off, and maintain that loss, even after two years. Use it to lose it. Step on the scale at the same time every day for the best results. If your weight shoots up several pounds from one weigh-in to the next, don't freak out. Eating a lot of salt the night before or having your period is the likely culprit. The number should return to normal in a day or two. It's a steady climb that you need to do something about. 9. Too Much Stress and Too Little Sleep Are Your Enemies When you're tired and frazzled, your body cranks up the production of cortisol, the stress hormone that can cause carb cravings. Not getting enough sleep also boosts your levels of ghrelin, a hormone associated with hunger, while suppressing leptin, a hormone that signals fullness and satiety. People on a diet who slept only five and a half hours a night for two weeks lost 55 percent less fat and were hungrier than those who slept eight and  a half hours, according to a study in the [place]. Use it to lose it. Prioritize sleep, aiming for  seven hours or more a night, which research shows helps lower stress. And make sure you're getting quality zzz's. If a snoring spouse or a fidgety cat wakes you up frequently throughout the night, you may end up getting the equivalent of just four hours of sleep, according to a study from [facility]. Keep pets out of the bedroom, and use a white-noise app to drown out snoring. 10. You Will Hit a plateau-And You Can Bust Through It As you slim down, your body releases much less leptin, the fullness hormone.  If you're not strength training, start right now. Building muscle can raise your metabolism to help you overcome a plateau. To keep your body challenged and burning calories, incorporate new moves and more intense intervals into your workouts or add another sweat session to your weekly routine. Alternatively, cut an extra 100 calories or so a day from your diet. Now that you've lost weight, your body simply doesn't need as much fuel.   We want weight loss that will last so you should lose 1-2 pounds a week.  THAT IS IT! Please pick THREE things a month to change. Once it is a habit check off the item. Then pick another three items off the list to become habits.  If you are already doing a habit on the list GREAT!  Cross that item off! o Don't drink your calories. Ie, alcohol, soda, fruit juice, and sweet tea.  o Drink more water. Drink a glass when you feel hungry or before each meal.  o Eat breakfast - Complex carb and protein (likeDannon light and fit yogurt, oatmeal, fruit, eggs, [place] bacon). o Measure your cereal.  Eat no more than one cup a day. (ie [place]) o Eat an apple a day. o Add a vegetable a day. o Try a new vegetable a month. o Use Pam! Stop using oil or butter to cook. o Don't finish your plate or use smaller plates. o Share your dessert. o Eat sugar free Jello for dessert or frozen grapes. o Don't eat 2-3 hours before bed. o Switch to whole wheat bread, pasta, and brown  rice. o Make healthier choices when you eat out. No fries! o Pick baked chicken, NOT fried. o Don't forget to SLOW DOWN when you eat. It is not going anywhere.  o Take the stairs. o Park far away in the parking lot o [employer] (or weights) for 10 minutes while watching TV. o Walk at work for 10 minutes during break. o Walk outside 1 time a week with your friend, kids, dog, or significant other. o Start a walking group at [place] the mall as much as you can tolerate.  o Keep a food diary. o Weigh yourself daily. o Walk for 15 minutes 3 days per week. o Cook at home more often and eat out less.  If life happens and you go back to old habits, it is okay.  Just start over. You can do it!   If you experience chest pain, get short of breath, or tired during the exercise, please stop immediately and inform your doctor.

## 2015-04-06 NOTE — Progress Notes (Addendum)
Complete Physical  Assessment and Plan: 1. Essential hypertension, benign - continue medications, DASH diet, exercise and monitor at home. Call if greater than 130/80.  - CBC with Differential/Platelet - BASIC METABOLIC PANEL WITH GFR - Hepatic function panel - TSH - Urinalysis, Routine w reflex microscopic (not at [facility]) - Microalbumin / creatinine urine ratio - EKG 12-Lead  2. Uncontrolled secondary diabetes mellitus with stage 2 CKD (GFR 60-89) Discussed general issues about diabetes pathophysiology and management., Educational material distributed., Suggested low cholesterol diet., Encouraged aerobic exercise., Discussed foot care., Reminded to get yearly retinal exam. Start on lisinopril 10mg  1/2 pill a day - Hemoglobin A1c - Insulin, fasting - LOW EXTREMITY NEUR EXAM DOCUM  3. Restless leg syndrome Continue requip  4. Hyperlipidemia -continue medications, check lipids, decrease fatty foods, increase activity.  - Lipid panel  5. Diverticulosis of large intestine without hemorrhage Remission, increase fiber  6. Vitamin D deficiency - Vit D  25 hydroxy (rtn osteoporosis monitoring)  7. Medication management - Magnesium  8. Gastroesophageal reflux disease with esophagitis Continue PPI/H2 blocker, diet discussed  9. Routine general medical examination at a health care facility Due colonoscopy next year  10. Tick bite Put hydrocortisone on it less than 24 hours, no changes transmission  11. Heel pain, left Conservative treatment, night time orthotics, arch support, RICE, NSAID, stretches given If not better will do injection of dexamethasone  12. Diabetic autonomic neuropathy associated with type 2 diabetes mellitus Do daily feet checks, control sugars, declines meds at this time - Vitamin B12  13. Screening for cervical cancer Will be last one - Cytology - PAP  14. Screening for rectal cancer - POC Hemoccult Bld/Stl (3-Cd Home Screen); Future  15. Screening  for viral disease Routine screening - HIV antibody - Hepatitis C antibody  Addendum: PAP shows ASC-H with + high risk HPV detected will refer to OB/GYN for colposcopy per current guidelines.   Discussed med's effects and SE's. Screening labs and tests as requested with regular follow-up as recommended.  HPI 64 y.o. female  presents for a complete physical. Her blood pressure has been controlled at home, today their BP is BP: 138/80 mmHg She does workout, walks the dog but having heel pain. She denies chest pain, shortness of breath, dizziness.  She is not on cholesterol medication and denies myalgias. Her cholesterol is at goal. The cholesterol last visit was:   Lab Results  Component Value Date   CHOL 163 01/04/2015   HDL 34* 01/04/2015   LDLCALC 93 01/04/2015   TRIG 180* 01/04/2015   CHOLHDL 4.8 01/04/2015   She has been working on diet and exercise for diabetes with CKD stage II but could not tolerate the metformin, she is on bASA, she is not on an ACE she is on cinnamon and denies paresthesia of the feet, polydipsia, polyuria and visual disturbances. Last A1C in the office was:  Lab Results  Component Value Date   HGBA1C 6.8* 01/04/2015   Patient is on Vitamin D supplement.   Lab Results  Component Value Date   VD25OH 60 01/04/2015   She is on requip for RLS which helps Having left heel pain, worse with stepping down in the AM and though out the day.  Found tick right ear, Sunday, denies fever, chills, joint pain.   Current Medications:  Current Outpatient Prescriptions on File Prior to Visit  Medication Sig Dispense Refill  . CINNAMON PO Take 1,000 mg by mouth daily.    Marland Kitchen CRANBERRY EXTRACT  PO Take 1 tablet by mouth daily.      . fluconazole (DIFLUCAN) 150 MG tablet Take 1 tablet (150 mg total) by mouth daily. 1 tablet 3  . Multiple Vitamins-Minerals (MULTIVITAMIN PO) Take by mouth daily.    . Phenazopyridine HCl (AZO TABS PO) Take 2 tablets by mouth daily.    Marland Kitchen  rOPINIRole (REQUIP) 0.5 MG tablet Take 1 tablet (0.5 mg total) by mouth 3 (three) times daily. 270 tablet 3  . UNABLE TO FIND Lite Salt 2-3 times a day    . VITAMIN D, CHOLECALCIFEROL, PO Take 4,000 Units by mouth daily.      No current facility-administered medications on file prior to visit.   Health Maintenance:   Immunization History  Administered Date(s) Administered  . Tdap 03/07/2011   Tetanus: 2012 Pneumovax: N/A- declines until age 76 Prevnar 4: NA Flu vaccine: 2015 at work Zostavax: N/A Pap: 2011 normal due today and will be last.  MGM: 04/2014 DEXA: 2008 normal Colonoscopy: 2012 due 5 years due 2017, Dr. Deatra Ina EGD: N/A MRI lumbar spine 2013 CXR 2013 Eye exam: 09/2014, Dr. Bing Plume Dentist: Dr. Harrington Challenger q 6 months  Allergies:  Allergies  Allergen Reactions  . Hctz [Hydrochlorothiazide]     Leg cramping  . Metformin And Related    Medical History:  Past Medical History  Diagnosis Date  . Arthritis     hips  . Restless leg syndrome   . EKG abnormalities     pt states she had a BBB on her last EKG  . Snoring   . Diet-controlled type 2 diabetes mellitus     Diet controlled  . Diverticulosis   . Dribbling urine   . Hyperlipidemia   . Vitamin D deficiency   . Essential hypertension, benign 08/19/2013   Surgical History:  Past Surgical History  Procedure Laterality Date  . Vaginal hysterectomy  1992  . Tubal ligation  1981  . Ovarian cyst removal  1981    right  . Breast biopsy  1969    left  . Colonoscopy    . Lumbar laminectomy/decompression microdiscectomy  05/07/2012    Procedure: LUMBAR LAMINECTOMY/DECOMPRESSION MICRODISCECTOMY;  Surgeon: Sinclair Ship, MD;  Location: [facility];  Service: Orthopedics;  Laterality: Bilateral;  Lumbar 4-5 decompression  . Eye surgery Bilateral     cataracts   Family History:  Family History  Problem Relation Age of Onset  . Hypertension Mother   . Diabetes Mother   . Stroke Mother   . Heart failure Mother   .  Colon cancer Mother 64  . Stomach cancer Neg Hx   . Esophageal cancer Neg Hx   . Heart failure Father 48  . Stroke Sister 1   Social History:  History  Substance Use Topics  . Smoking status: Never Smoker   . Smokeless tobacco: Never Used  . Alcohol Use: No   Review of Systems  Constitutional: Negative.   HENT: Negative.   Eyes: Negative.  Negative for blurred vision.  Respiratory: Negative.  Negative for shortness of breath.   Cardiovascular: Negative.  Negative for chest pain.  Gastrointestinal: Negative.   Genitourinary: Negative.        Stress incontinence  Musculoskeletal: Positive for myalgias (left heel pain). Negative for back pain, joint pain, falls and neck pain.  Skin: Negative for itching and rash.       Tick bite right ear     Physical Exam: Estimated body mass index is 28.29 kg/(m^2) as calculated from  the following:   Height as of this encounter: 5\' 5"  (1.651 m).   Weight as of this encounter: 170 lb (77.111 kg). BP 138/80 mmHg  Pulse 68  Temp(Src) 98.8 F (37.1 C)  Resp 16  Ht 5\' 5"  (1.651 m)  Wt 170 lb (77.111 kg)  BMI 28.29 kg/m2 General Appearance: Well nourished, in no apparent distress. Eyes: PERRLA, EOMs, conjunctiva no swelling or erythema, normal fundi and vessels. Sinuses: No Frontal/maxillary tenderness ENT/Mouth: Ext aud canals clear, normal light reflex with TMs without erythema, bulging.  Good dentition. No erythema, swelling, or exudate on post pharynx. Tonsils not swollen or erythematous. Hearing normal.  Neck: Supple, thyroid normal. No bruits Respiratory: Respiratory effort normal, BS equal bilaterally without rales, rhonchi, wheezing or stridor. Cardio: RRR without murmurs, rubs or gallops. Brisk peripheral pulses without edema.  Chest: symmetric, with normal excursions and percussion. Breasts: Symmetric, without lumps, nipple discharge, retractions. Abdomen: Soft, +BS, Non tender, no guarding, rebound, hernias, masses, or  organomegaly. .  Lymphatics: Non tender without lymphadenopathy.  Genitourinary: defer Musculoskeletal: Full ROM all peripheral extremities,5/5 strength, and normal gait. Right heel pain with dorsiflexion.  Skin: Warm, dry without rashes, lesions, ecchymosis. Right ear with small erythematous nodule without swelling, discharge.  Neuro: Cranial nerves intact, reflexes equal bilaterally. Normal muscle tone, no cerebellar symptoms. Sensation decreased bilateral feet.  Psych: Awake and oriented X 3, normal affect, Insight and Judgment appropriate.   EKG: WNL no changes, IRBBB AORTA SCAN: defer   Vicie Mutters 10:03 AM

## 2015-04-07 LAB — HIV ANTIBODY (ROUTINE TESTING W REFLEX): HIV 1&2 Ab, 4th Generation: NONREACTIVE

## 2015-04-07 LAB — HEPATIC FUNCTION PANEL
ALK PHOS: 70 U/L (ref 39–117)
ALT: 16 U/L (ref 0–35)
AST: 19 U/L (ref 0–37)
Albumin: 4.2 g/dL (ref 3.5–5.2)
BILIRUBIN DIRECT: 0.1 mg/dL (ref 0.0–0.3)
BILIRUBIN INDIRECT: 0.3 mg/dL (ref 0.2–1.2)
Total Bilirubin: 0.4 mg/dL (ref 0.2–1.2)
Total Protein: 7.4 g/dL (ref 6.0–8.3)

## 2015-04-07 LAB — HEPATITIS C ANTIBODY: HCV AB: NEGATIVE

## 2015-04-07 LAB — BASIC METABOLIC PANEL WITH GFR
BUN: 15 mg/dL (ref 6–23)
CO2: 24 mEq/L (ref 19–32)
Calcium: 9.3 mg/dL (ref 8.4–10.5)
Chloride: 100 mEq/L (ref 96–112)
Creat: 0.98 mg/dL (ref 0.50–1.10)
GFR, EST NON AFRICAN AMERICAN: 61 mL/min
GFR, Est African American: 71 mL/min
Glucose, Bld: 95 mg/dL (ref 70–99)
POTASSIUM: 4.1 meq/L (ref 3.5–5.3)
Sodium: 139 mEq/L (ref 135–145)

## 2015-04-07 LAB — TSH: TSH: 1.829 u[IU]/mL (ref 0.350–4.500)

## 2015-04-07 LAB — URINALYSIS, ROUTINE W REFLEX MICROSCOPIC
BILIRUBIN URINE: NEGATIVE
Glucose, UA: NEGATIVE mg/dL
Hgb urine dipstick: NEGATIVE
Ketones, ur: NEGATIVE mg/dL
Leukocytes, UA: NEGATIVE
Nitrite: NEGATIVE
PROTEIN: NEGATIVE mg/dL
SPECIFIC GRAVITY, URINE: 1.008 (ref 1.005–1.030)
Urobilinogen, UA: 0.2 mg/dL (ref 0.0–1.0)
pH: 5.5 (ref 5.0–8.0)

## 2015-04-07 LAB — HEMOGLOBIN A1C
HEMOGLOBIN A1C: 6.5 % — AB (ref <5.7)
Mean Plasma Glucose: 140 mg/dL — ABNORMAL HIGH (ref <117)

## 2015-04-07 LAB — LIPID PANEL
CHOL/HDL RATIO: 4.4 ratio
Cholesterol: 154 mg/dL (ref 0–200)
HDL: 35 mg/dL — ABNORMAL LOW (ref >=46)
LDL Cholesterol: 88 mg/dL (ref 0–99)
Triglycerides: 156 mg/dL — ABNORMAL HIGH (ref <150)
VLDL: 31 mg/dL (ref 0–40)

## 2015-04-07 LAB — MICROALBUMIN / CREATININE URINE RATIO
Creatinine, Urine: 45.1 mg/dL
Microalb, Ur: <0.2 mg/dL (ref <2.0)

## 2015-04-07 LAB — INSULIN, FASTING: Insulin fasting, serum: 4.5 u[IU]/mL (ref 2.0–19.6)

## 2015-04-07 LAB — MAGNESIUM: MAGNESIUM: 1.9 mg/dL (ref 1.5–2.5)

## 2015-04-07 LAB — VITAMIN B12: Vitamin B-12: 539 pg/mL (ref 211–911)

## 2015-04-07 LAB — VITAMIN D 25 HYDROXY (VIT D DEFICIENCY, FRACTURES): VIT D 25 HYDROXY: 66 ng/mL (ref 30–100)

## 2015-04-11 NOTE — Addendum Note (Signed)
Addended by: Vicie Mutters R on: 04/11/2015 12:52 PM   Modules accepted: Orders, SmartSet

## 2015-04-12 LAB — CYTOLOGY - PAP

## 2015-04-13 ENCOUNTER — Other Ambulatory Visit: Payer: Self-pay | Admitting: Physician Assistant

## 2015-04-13 DIAGNOSIS — Z1231 Encounter for screening mammogram for malignant neoplasm of breast: Secondary | ICD-10-CM

## 2015-04-27 ENCOUNTER — Other Ambulatory Visit: Payer: Self-pay | Admitting: *Deleted

## 2015-04-27 DIAGNOSIS — Z1212 Encounter for screening for malignant neoplasm of rectum: Secondary | ICD-10-CM

## 2015-04-27 LAB — POC HEMOCCULT BLD/STL (HOME/3-CARD/SCREEN)
Card #2 Fecal Occult Blod, POC: NEGATIVE
FECAL OCCULT BLD: NEGATIVE
FECAL OCCULT BLD: NEGATIVE

## 2015-05-08 ENCOUNTER — Ambulatory Visit (HOSPITAL_COMMUNITY)
Admission: RE | Admit: 2015-05-08 | Discharge: 2015-05-08 | Disposition: A | Discharge status: Home / Self Care | Payer: 59 | Source: Ambulatory Visit | Attending: Physician Assistant | Admitting: Physician Assistant

## 2015-05-08 DIAGNOSIS — Z1231 Encounter for screening mammogram for malignant neoplasm of breast: Secondary | ICD-10-CM | POA: Insufficient documentation

## 2015-05-26 ENCOUNTER — Encounter: Payer: Self-pay | Admitting: Gynecology

## 2015-05-26 ENCOUNTER — Ambulatory Visit: Payer: 59 | Attending: Gynecology | Admitting: Gynecology

## 2015-05-26 VITALS — BP 130/68 | HR 70 | Temp 99.1°F | Resp 18 | Ht 65.0 in | Wt 167.9 lb

## 2015-05-26 DIAGNOSIS — N891 Moderate vaginal dysplasia: Secondary | ICD-10-CM | POA: Insufficient documentation

## 2015-05-26 MED ORDER — FLUOROURACIL 5 % EX CREA: TOPICAL_CREAM | Freq: Every day | CUTANEOUS | Status: DC

## 2015-05-26 NOTE — Progress Notes (Signed)
Consult Note: Gyn-Onc   Rachel Daniel 64 y.o. female  Chief Complaint  Patient presents with  . 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   Plan:  Given what I view as multifocal disease, I would recommend we treat with intravaginal Efudex. Patient given instructions to use Efudex for 4 consecutive nights this month and then again next month. We will repeat colposcopy and Pap smears approximate month to 6 weeks after completing the second cycle of Efudex. Side effects of Efudex were discussed the patient's given instruction sheet on how to prevent vulvar irritation.  The patient return to see me in 3 months.  HPI: 64 year old white widowed female seen in consultation request of Dr. Sherren Mocha Meisinger regarding management of a newly diagnosed high-grade dysplasia of the vagina. The patient previously has had a hysterectomy for menorrhagia and pelvic discomfort. She denies any past history of abnormal Pap smears. She does note that her mother had cervix and uterine cancer.  Patient had a routine Pap smear that showed atypical squamous cells cannot rule out high-grade dysplasia. Subsequent colposcopy identified areas of white epithelium which were biopsied and interpreted as high-grade vaginal intraepithelial neoplasia (V AIN 2, moderate dysplasia) the patient denies any pelvic symptoms. She has no GI or GU symptoms.  Review of Systems:10 point review of systems is negative except as noted in interval history.   Vitals: Blood pressure 130/68, pulse 70, temperature 99.1 F (37.3 C), temperature source Oral, resp. rate 18, height 5\' 5"  (1.651 m), weight 167 lb 14.4 oz (76.159 kg), SpO2 98 %.  Physical Exam: General : The patient is a healthy woman in no acute distress.  HEENT: normocephalic, extraoccular movements normal; neck is supple without thyromegally  Lynphnodes: Supraclavicular and inguinal nodes  not enlarged  Abdomen: Soft, non-tender, no ascites, no organomegally, no masses, no hernias  Pelvic:  EGBUS: Normal female  Vagina: Normal, no lesions on visual inspection. (See colposcopy note below) Urethra and Bladder: Normal, non-tender  Cervix: Surgically absent  Uterus: Surgically absent  Bi-manual examination: Non-tender; no adenxal masses or nodularity  Rectal: normal sphincter tone, no masses, no blood  Lower extremities: No edema or varicosities. Normal range of motion    Procedure note:  After verbal informed consent a speculum was placed in the vagina and acetic acid was applied. Colposcopy was performed revealing some thick white epithelium at the apex the vagina and several others along the lateral vaginal walls. These are consistent with the biopsy showing high-grade dysplasia. There are no abnormal vessels. There is no bleeding.   Impression high-grade dysplasia which is multifocal in the vagina.    Allergies  Allergen Reactions  . Hctz [Hydrochlorothiazide]     Leg cramping  . Metformin And Related     Past Medical History  Diagnosis Date  . Arthritis     hips  . Restless leg syndrome   . EKG abnormalities     pt states she had a BBB on her last EKG  . Snoring   . Diet-controlled type 2 diabetes mellitus     Diet controlled  . Diverticulosis   . Dribbling urine   . Hyperlipidemia   . Vitamin D deficiency   . Essential hypertension, benign 08/19/2013  . Cataract     bilateral    Past Surgical History  Procedure Laterality Date  . Vaginal hysterectomy  1992  . Tubal ligation  1981  .  Ovarian cyst removal  1981    right  . Breast biopsy  1969    left  . Colonoscopy    . Lumbar laminectomy/decompression microdiscectomy  05/07/2012    Procedure: LUMBAR LAMINECTOMY/DECOMPRESSION MICRODISCECTOMY;  Surgeon: Sinclair Ship, MD;  Location: [facility];  Service: Orthopedics;  Laterality: Bilateral;  Lumbar 4-5 decompression    Current Outpatient  Prescriptions  Medication Sig Dispense Refill  . CINNAMON PO Take 1,000 mg by mouth daily.    Marland Kitchen CRANBERRY EXTRACT PO Take 1 tablet by mouth daily.      . fluconazole (DIFLUCAN) 150 MG tablet Take 150 mg by mouth as needed.    Marland Kitchen lisinopril (PRINIVIL,ZESTRIL) 10 MG tablet 1/2-1 tablet in the morning for HTN/kidney protection from DM 30 tablet 2  . Multiple Vitamins-Minerals (MULTIVITAMIN PO) Take by mouth daily.    . Phenazopyridine HCl (AZO TABS PO) Take 2 tablets by mouth daily.    Marland Kitchen rOPINIRole (REQUIP) 0.5 MG tablet Take 1 tablet (0.5 mg total) by mouth 3 (three) times daily. (Patient taking differently: Take 0.5 mg by mouth. Take 1.5mg  daily at bedtime) 270 tablet 3  . UNABLE TO FIND Lite Salt 2-3 times a day    . VITAMIN D, CHOLECALCIFEROL, PO Take 4,000 Units by mouth daily.      No current facility-administered medications for this visit.    Social History   Social History  . Marital Status: Widowed    Spouse Name: N/A  . Number of Children: N/A  . Years of Education: N/A   Occupational History  . Not on file.   Social History Main Topics  . Smoking status: Never Smoker   . Smokeless tobacco: Never Used  . Alcohol Use: No  . Drug Use: No  . Sexual Activity: Not Currently   Other Topics Concern  . Not on file   Social History Narrative    Family History  Problem Relation Age of Onset  . Hypertension Mother   . Diabetes Mother   . Stroke Mother   . Heart failure Mother   . Colon cancer Mother 30  . Stomach cancer Neg Hx   . Esophageal cancer Neg Hx   . Heart failure Father 32  . Stroke Sister 23      Alvino Chapel, MD 05/26/2015, 10:02 AM

## 2015-05-26 NOTE — Patient Instructions (Signed)
Plan to begin efudex cream at bedtime for four nights in a row.  See instruction sheet for details about vaseline to the vulva and vinegar douches in the am after.  Repeat in one month for four days in a row.  We will see you in three months.  Please call for any questions or concerns.

## 2015-05-29 ENCOUNTER — Encounter: Payer: Self-pay | Admitting: Gynecology

## 2015-05-29 ENCOUNTER — Encounter: Payer: Self-pay | Admitting: Physician Assistant

## 2015-05-29 MED ORDER — FLUCONAZOLE 150 MG PO TABS: 150.0000 mg | ORAL_TABLET | Freq: Every day | ORAL | Status: DC

## 2015-07-03 ENCOUNTER — Other Ambulatory Visit: Payer: Self-pay | Admitting: Physician Assistant

## 2015-07-14 ENCOUNTER — Other Ambulatory Visit: Payer: Self-pay | Admitting: Physician Assistant

## 2015-09-01 ENCOUNTER — Ambulatory Visit: Payer: 59 | Attending: Gynecology | Admitting: Gynecology

## 2015-09-01 ENCOUNTER — Encounter: Payer: Self-pay | Admitting: Gynecology

## 2015-09-01 ENCOUNTER — Other Ambulatory Visit (HOSPITAL_COMMUNITY)
Admission: RE | Admit: 2015-09-01 | Discharge: 2015-09-01 | Disposition: A | Discharge status: Home / Self Care | Payer: 59 | Source: Ambulatory Visit | Attending: Gynecology | Admitting: Gynecology

## 2015-09-01 VITALS — BP 146/81 | HR 75 | Temp 99.5°F | Resp 18 | Ht 65.0 in | Wt 167.0 lb

## 2015-09-01 DIAGNOSIS — N891 Moderate vaginal dysplasia: Secondary | ICD-10-CM | POA: Diagnosis present

## 2015-09-01 DIAGNOSIS — Z01411 Encounter for gynecological examination (general) (routine) with abnormal findings: Secondary | ICD-10-CM | POA: Insufficient documentation

## 2015-09-01 NOTE — Patient Instructions (Signed)
We will contact you with Pap smear report. If it is normal please return in 6 months for repeat Pap smear.

## 2015-09-01 NOTE — Addendum Note (Signed)
Addended by: Joylene John D on: 09/01/2015 01:28 PM   Modules accepted: Orders

## 2015-09-01 NOTE — Progress Notes (Signed)
Consult Note: Gyn-Onc   Rachel Daniel 64 y.o. female  Chief Complaint  Patient presents with  . VAIN II    Follow up    Assessment : VIN 3 status post a standard course of intravaginal Efudex.   Plan: Pap smear is obtained. If the Pap smear is normal we will have the patient return in 6 months for repeat Pap smear. If it is abnormal she will return for repeat colposcopy and reevaluation.    HPI: 64 year old white widowed female seen in consultation request of Dr. Sherren Mocha Daniel regarding management of a newly diagnosed high-grade dysplasia of the vagina. The patient previously has had a hysterectomy for menorrhagia and pelvic discomfort. She denies any past history of abnormal Pap smears. She does note that her mother had cervix and uterine cancer.  Patient had a routine Pap smear that showed atypical squamous cells cannot rule out high-grade dysplasia. Subsequent colposcopy identified areas of white epithelium which were biopsied and interpreted as high-grade vaginal intraepithelial neoplasia (V AIN 2, moderate dysplasia) the patient denies any pelvic symptoms. She has no GI or GU symptoms.  Given the multifocal nature of the lesion the patient was treated with intravaginal Efudex.  Review of Systems:10 point review of systems is negative except as noted in interval history.   Vitals: Blood pressure 146/81, pulse 75, temperature 99.5 F (37.5 C), temperature source Oral, resp. rate 18, height 5\' 5"  (1.651 m), weight 167 lb (75.751 kg), SpO2 99 %.  Physical Exam: General : The patient is a healthy woman in no acute distress.  HEENT: normocephalic, extraoccular movements normal; neck is supple without thyromegally  Lynphnodes: Supraclavicular and inguinal nodes not enlarged  Abdomen: Soft, non-tender, no ascites, no organomegally, no masses, no hernias  Pelvic:  EGBUS: Normal female  Vagina: Normal, no lesions on visual inspection. There is a firm amount of atrophy. Urethra and  Bladder: Normal, non-tender  Cervix: Surgically absent  Uterus: Surgically absent  Bi-manual examination: Non-tender; no adenxal masses or nodularity  Rectal: normal sphincter tone, no masses, no blood  Lower extremities: No edema or varicosities. Normal range of motion      Allergies  Allergen Reactions  . Hctz [Hydrochlorothiazide]     Leg cramping  . Metformin And Related     Past Medical History  Diagnosis Date  . Arthritis     hips  . Restless leg syndrome   . EKG abnormalities     pt states she had a BBB on her last EKG  . Snoring   . Diet-controlled type 2 diabetes mellitus (HCC)     Diet controlled  . Diverticulosis   . Dribbling urine   . Hyperlipidemia   . Vitamin D deficiency   . Essential hypertension, benign 08/19/2013  . Cataract     bilateral    Past Surgical History  Procedure Laterality Date  . Vaginal hysterectomy  1992  . Tubal ligation  1981  . Ovarian cyst removal  1981    right  . Breast biopsy  1969    left  . Colonoscopy    . Lumbar laminectomy/decompression microdiscectomy  05/07/2012    Procedure: LUMBAR LAMINECTOMY/DECOMPRESSION MICRODISCECTOMY;  Surgeon: Sinclair Ship, MD;  Location: [facility];  Service: Orthopedics;  Laterality: Bilateral;  Lumbar 4-5 decompression    Current Outpatient Prescriptions  Medication Sig Dispense Refill  . CRANBERRY EXTRACT PO Take 1 tablet by mouth daily.      Marland Kitchen lisinopril (PRINIVIL,ZESTRIL) 10 MG tablet TAKE 1/2 TO 1  TABLET BY MOUTH IN THE MORNING FOR HYPERTENSION/KIDNEY PROTECTION FROM DIABETES 90 tablet 1  . Multiple Vitamins-Minerals (MULTIVITAMIN PO) Take by mouth daily.    . Phenazopyridine HCl (AZO TABS PO) Take 2 tablets by mouth daily.    Marland Kitchen rOPINIRole (REQUIP) 0.5 MG tablet TAKE 1 TABLET BY MOUTH 3 TIMES DAILY 270 tablet 1  . UNABLE TO FIND Lite Salt 2-3 times a day    . VITAMIN D, CHOLECALCIFEROL, PO Take 4,000 Units by mouth daily.     . fluconazole (DIFLUCAN) 150 MG tablet Take 1 tablet  (150 mg total) by mouth daily. (Patient not taking: Reported on 09/01/2015) 1 tablet 3  . fluorouracil (EFUDEX) 5 % cream Apply topically daily. For four days in a row then repeat the next month for four days in a row. (Patient not taking: Reported on 09/01/2015) 40 g 1   No current facility-administered medications for this visit.    Social History   Social History  . Marital Status: Widowed    Spouse Name: N/A  . Number of Children: N/A  . Years of Education: N/A   Occupational History  . Not on file.   Social History Main Topics  . Smoking status: Never Smoker   . Smokeless tobacco: Never Used  . Alcohol Use: No  . Drug Use: No  . Sexual Activity: Not Currently   Other Topics Concern  . Not on file   Social History Narrative    Family History  Problem Relation Age of Onset  . Hypertension Mother   . Diabetes Mother   . Stroke Mother   . Heart failure Mother   . Colon cancer Mother 35  . Stomach cancer Neg Hx   . Esophageal cancer Neg Hx   . Heart failure Father 70  . Stroke Sister 51      Alvino Chapel, MD 09/01/2015, 12:10 PM

## 2015-09-05 LAB — CYTOLOGY - PAP

## 2015-09-06 ENCOUNTER — Telehealth: Payer: Self-pay

## 2015-09-06 NOTE — Telephone Encounter (Signed)
Patient contacted with PAP obtained on 09/01/2015 with Dr Fermin Schwab are "normal" , denies further questions or concerns at this time.

## 2015-10-19 MED FILL — LISINOPRIL 10 MG TABLET: 10 | 90 days supply | Qty: 90 | Fill #1

## 2015-10-25 ENCOUNTER — Encounter: Payer: Self-pay | Admitting: Physician Assistant

## 2015-10-25 ENCOUNTER — Ambulatory Visit (HOSPITAL_COMMUNITY)
Admission: RE | Admit: 2015-10-25 | Discharge: 2015-10-25 | Disposition: A | Discharge status: Home / Self Care | Payer: 59 | Source: Ambulatory Visit | Attending: Physician Assistant | Admitting: Physician Assistant

## 2015-10-25 ENCOUNTER — Ambulatory Visit (INDEPENDENT_AMBULATORY_CARE_PROVIDER_SITE_OTHER): Payer: 59 | Admitting: Physician Assistant

## 2015-10-25 VITALS — BP 128/70 | HR 74 | Temp 97.7°F | Resp 16 | Ht 65.0 in | Wt 168.4 lb

## 2015-10-25 DIAGNOSIS — M5134 Other intervertebral disc degeneration, thoracic region: Secondary | ICD-10-CM | POA: Diagnosis not present

## 2015-10-25 DIAGNOSIS — Z79899 Other long term (current) drug therapy: Secondary | ICD-10-CM

## 2015-10-25 DIAGNOSIS — E1365 Other specified diabetes mellitus with hyperglycemia: Secondary | ICD-10-CM

## 2015-10-25 DIAGNOSIS — E119 Type 2 diabetes mellitus without complications: Secondary | ICD-10-CM | POA: Diagnosis not present

## 2015-10-25 DIAGNOSIS — E1322 Other specified diabetes mellitus with diabetic chronic kidney disease: Secondary | ICD-10-CM

## 2015-10-25 DIAGNOSIS — N182 Chronic kidney disease, stage 2 (mild): Secondary | ICD-10-CM | POA: Diagnosis not present

## 2015-10-25 DIAGNOSIS — I10 Essential (primary) hypertension: Secondary | ICD-10-CM | POA: Diagnosis not present

## 2015-10-25 DIAGNOSIS — E559 Vitamin D deficiency, unspecified: Secondary | ICD-10-CM | POA: Diagnosis not present

## 2015-10-25 DIAGNOSIS — E785 Hyperlipidemia, unspecified: Secondary | ICD-10-CM

## 2015-10-25 DIAGNOSIS — K219 Gastro-esophageal reflux disease without esophagitis: Secondary | ICD-10-CM | POA: Insufficient documentation

## 2015-10-25 DIAGNOSIS — IMO0002 Reserved for concepts with insufficient information to code with codable children: Secondary | ICD-10-CM

## 2015-10-25 DIAGNOSIS — Z0000 Encounter for general adult medical examination without abnormal findings: Secondary | ICD-10-CM | POA: Diagnosis not present

## 2015-10-25 DIAGNOSIS — G2581 Restless legs syndrome: Secondary | ICD-10-CM | POA: Diagnosis not present

## 2015-10-25 LAB — CBC WITH DIFFERENTIAL/PLATELET
BASOS ABS: 0 10*3/uL (ref 0.0–0.1)
Basophils Relative: 0 % (ref 0–1)
Eosinophils Absolute: 0.1 10*3/uL (ref 0.0–0.7)
Eosinophils Relative: 2 % (ref 0–5)
HEMATOCRIT: 41 % (ref 36.0–46.0)
Hemoglobin: 13.8 g/dL (ref 12.0–15.0)
LYMPHS ABS: 2.3 10*3/uL (ref 0.7–4.0)
LYMPHS PCT: 39 % (ref 12–46)
MCH: 29.2 pg (ref 26.0–34.0)
MCHC: 33.7 g/dL (ref 30.0–36.0)
MCV: 86.9 fL (ref 78.0–100.0)
MONOS PCT: 7 % (ref 3–12)
MPV: 9.1 fL (ref 8.6–12.4)
Monocytes Absolute: 0.4 10*3/uL (ref 0.1–1.0)
NEUTROS ABS: 3 10*3/uL (ref 1.7–7.7)
NEUTROS PCT: 52 % (ref 43–77)
Platelets: 173 10*3/uL (ref 150–400)
RBC: 4.72 MIL/uL (ref 3.87–5.11)
RDW: 13.2 % (ref 11.5–15.5)
WBC: 5.8 10*3/uL (ref 4.0–10.5)

## 2015-10-25 NOTE — Patient Instructions (Addendum)
Schedule for your colonoscopy, due this year  Restless Legs Syndrome Restless legs syndrome is a condition that causes uncomfortable feelings or sensations in the legs, especially while sitting or lying down. The sensations usually cause an overwhelming urge to move the legs. The arms can also sometimes be affected. The condition can range from mild to severe. The symptoms often interfere with a person's ability to sleep. CAUSES The cause of this condition is not known. RISK FACTORS This condition is more likely to develop in:  People who are older than age 55.  Pregnant women. In general, restless legs syndrome is more common in women than in men.  People who have a family history of the condition.  People who have certain medical conditions, such as iron deficiency, kidney disease, Parkinson disease, or nerve damage.  People who take certain medicines, such as medicines for high blood pressure, nausea, colds, allergies, depression, and some heart conditions. SYMPTOMS The main symptom of this condition is uncomfortable sensations in the legs. These sensations may be:  Described as pulling, tingling, prickling, throbbing, crawling, or burning.  Worse while you are sitting or lying down.  Worse during periods of rest or inactivity.  Worse at night, often interfering with your sleep.  Accompanied by a very strong urge to move your legs.  Temporarily relieved by movement of your legs. The sensations usually affect both sides of the body. The arms can also be affected, but this is rare. People who have this condition often have tiredness during the day because of their lack of sleep at night. DIAGNOSIS This condition may be diagnosed based on your description of the symptoms. You may also have tests, including blood tests, to check for other conditions that may lead to your symptoms. In some cases, you may be asked to spend some time in a sleep lab so your sleeping can be  monitored. TREATMENT Treatment for this condition is focused on managing the symptoms. Treatment may include:  Self-help and lifestyle changes.  Medicines. HOME CARE INSTRUCTIONS  Take medicines only as directed by your health care provider.  Try these methods to get temporary relief from the uncomfortable sensations:  Massage your legs.  Walk or stretch.  Take a cold or hot bath.  Practice good sleep habits. For example, go to bed and get up at the same time every day.  Exercise regularly.  Practice ways of relaxing, such as yoga or meditation.  Avoid caffeine and alcohol.  Do not use any tobacco products, including cigarettes, chewing tobacco, or electronic cigarettes. If you need help quitting, ask your health care provider.  Keep all follow-up visits as directed by your health care provider. This is important. SEEK MEDICAL CARE IF: Your symptoms do not improve with treatment, or they get worse.   This information is not intended to replace advice given to you by your health care provider. Make sure you discuss any questions you have with your health care provider.   Document Released: 09/20/2002 Document Revised: 02/14/2015 Document Reviewed: 09/26/2014 Elsevier Interactive Patient Education [employer].

## 2015-10-25 NOTE — Progress Notes (Signed)
Assessment and Plan:  1. Hypertension -Continue medication, monitor blood pressure at home. Continue DASH diet.  Reminder to go to the ER if any CP, SOB, nausea, dizziness, severe HA, changes vision/speech, left arm numbness and tingling and jaw pain.  2. Cholesterol -Continue diet and exercise. Check cholesterol.   3. Diabetes with CKD stage 2 GFR 63 last visit -Continue diet and exercise. Check A1C  4. Vitamin D Def - check level and continue medications.   5. RLS Will increase to 2mg  at night, if she runs out will send in 1mg  TID  Continue diet and meds as discussed. Further disposition pending results of labs. Over 30 minutes of exam, counseling, chart review, and critical decision making was performed  Future Appointments Date Time Provider [place]  04/05/2016 9:30 AM Vicie Mutters, PA-C [facility] None  10/29/2016 3:00 PM Vicie Mutters, PA-C [facility] None     HPI 65 y.o. female  presents for 3 month follow up on hypertension, cholesterol, prediabetes, and vitamin D deficiency.   Her blood pressure has been controlled at home, today their BP is BP: 128/70 mmHg  She does not workout. She denies chest pain, shortness of breath, dizziness.  She is not on cholesterol medication and denies myalgias. Her cholesterol is not at goal. The cholesterol last visit was:   Lab Results  Component Value Date   CHOL 154 04/06/2015   HDL 35* 04/06/2015   LDLCALC 88 04/06/2015   TRIG 156* 04/06/2015   CHOLHDL 4.4 04/06/2015   She has been working on diet and exercise for Diabetes with diabetic chronic kidney disease, she is on bASA, she is on ACE/ARB, does not check sugars at home, and denies paresthesia of the feet, polydipsia, polyuria and visual disturbances. Last A1C was:  Lab Results  Component Value Date   HGBA1C 6.5* 04/06/2015  Patient is on Vitamin D supplement.   Lab Results  Component Value Date   VD25OH 66 04/06/2015  Seeing Dr. Lynann Bologna for right hip/back  pain and had an injection that helped for only 4 weeks, she is now just doing PT at home.    She is on requip for RLS, she is on 1.5mg  Due for colonoscopy this year, UPTD on MGM.   Current Medications:  Current Outpatient Prescriptions on File Prior to Visit  Medication Sig Dispense Refill  . CRANBERRY EXTRACT PO Take 1 tablet by mouth daily.      . fluconazole (DIFLUCAN) 150 MG tablet Take 1 tablet (150 mg total) by mouth daily. 1 tablet 3  . fluorouracil (EFUDEX) 5 % cream Apply topically daily. For four days in a row then repeat the next month for four days in a row. 40 g 1  . lisinopril (PRINIVIL,ZESTRIL) 10 MG tablet TAKE 1/2 TO 1 TABLET BY MOUTH IN THE MORNING FOR HYPERTENSION/KIDNEY PROTECTION FROM DIABETES 90 tablet 1  . Multiple Vitamins-Minerals (MULTIVITAMIN PO) Take by mouth daily.    . Phenazopyridine HCl (AZO TABS PO) Take 2 tablets by mouth daily.    Marland Kitchen rOPINIRole (REQUIP) 0.5 MG tablet TAKE 1 TABLET BY MOUTH 3 TIMES DAILY 270 tablet 1  . UNABLE TO FIND Lite Salt 2-3 times a day    . VITAMIN D, CHOLECALCIFEROL, PO Take 4,000 Units by mouth daily.      No current facility-administered medications on file prior to visit.   Medical History:  Past Medical History  Diagnosis Date  . Arthritis     hips  . Restless leg syndrome   .  EKG abnormalities     pt states she had a BBB on her last EKG  . Snoring   . Diet-controlled type 2 diabetes mellitus (HCC)     Diet controlled  . Diverticulosis   . Dribbling urine   . Hyperlipidemia   . Vitamin D deficiency   . Essential hypertension, benign 08/19/2013  . Cataract     bilateral   Allergies:  Allergies  Allergen Reactions  . Hctz [Hydrochlorothiazide]     Leg cramping  . Metformin And Related      Review of Systems:  Review of Systems  Constitutional: Negative.   HENT: Negative.   Eyes: Negative.   Respiratory: Negative.  Negative for shortness of breath and wheezing.   Cardiovascular: Negative for chest pain,  palpitations, orthopnea, claudication, leg swelling and PND.       No orthpnea, PND.   Gastrointestinal: Negative.   Genitourinary: Negative.   Musculoskeletal: Negative.   Skin: Negative.   Neurological: Negative.   Endo/Heme/Allergies: Negative.   Psychiatric/Behavioral: Negative.    Family history- Review and unchanged Social history- Review and unchanged Physical Exam: BP 128/70 mmHg  Pulse 74  Temp(Src) 97.7 F (36.5 C) (Temporal)  Resp 16  Ht 5\' 5"  (1.651 m)  Wt 168 lb 6.4 oz (76.386 kg)  BMI 28.02 kg/m2  SpO2 94% Wt Readings from Last 3 Encounters:  10/25/15 168 lb 6.4 oz (76.386 kg)  09/01/15 167 lb (75.751 kg)  05/26/15 167 lb 14.4 oz (76.159 kg)   General Appearance: Well nourished, in no apparent distress. Eyes: PERRLA, EOMs, conjunctiva no swelling or erythema Sinuses: No Frontal/maxillary tenderness ENT/Mouth: Ext aud canals clear, TMs without erythema, bulging. No erythema, swelling, or exudate on post pharynx.  Tonsils not swollen or erythematous. Hearing normal.  Neck: Supple, thyroid normal.  Respiratory: Respiratory effort normal, BS equal bilaterally without rales, rhonchi, wheezing or stridor.  Cardio: RRR with no MRGs. Brisk peripheral pulses without edema.  Abdomen: Soft, + BS,  Non tender, no guarding, rebound, hernias, masses. Lymphatics: Non tender without lymphadenopathy.  Musculoskeletal: Full ROM, 5/5 strength, Normal gait Skin: Warm, dry without rashes, lesions, ecchymosis.  Neuro: Cranial nerves intact. Normal muscle tone, no cerebellar symptoms. Psych: Awake and oriented X 3, normal affect, Insight and Judgment appropriate.    Vicie Mutters, PA-C 3:27 PM [facility]

## 2015-10-26 LAB — BASIC METABOLIC PANEL WITH GFR
BUN: 18 mg/dL (ref 7–25)
CALCIUM: 9.3 mg/dL (ref 8.6–10.4)
CO2: 29 mmol/L (ref 20–31)
Chloride: 105 mmol/L (ref 98–110)
Creat: 0.95 mg/dL (ref 0.50–0.99)
GFR, Est African American: 73 mL/min (ref >=60)
GFR, Est Non African American: 63 mL/min (ref >=60)
GLUCOSE: 94 mg/dL (ref 65–99)
POTASSIUM: 4.1 mmol/L (ref 3.5–5.3)
Sodium: 140 mmol/L (ref 135–146)

## 2015-10-26 LAB — HEMOGLOBIN A1C
Hgb A1c MFr Bld: 6.6 % — ABNORMAL HIGH (ref <5.7)
MEAN PLASMA GLUCOSE: 143 mg/dL — AB (ref <117)

## 2015-10-26 LAB — HEPATIC FUNCTION PANEL
ALK PHOS: 57 U/L (ref 33–130)
ALT: 16 U/L (ref 6–29)
AST: 18 U/L (ref 10–35)
Albumin: 4.4 g/dL (ref 3.6–5.1)
BILIRUBIN INDIRECT: 0.2 mg/dL (ref 0.2–1.2)
Bilirubin, Direct: 0.1 mg/dL (ref <=0.2)
TOTAL PROTEIN: 7.1 g/dL (ref 6.1–8.1)
Total Bilirubin: 0.3 mg/dL (ref 0.2–1.2)

## 2015-10-26 LAB — LIPID PANEL
Cholesterol: 155 mg/dL (ref 125–200)
HDL: 34 mg/dL — ABNORMAL LOW (ref >=46)
LDL Cholesterol: 94 mg/dL (ref <130)
Total CHOL/HDL Ratio: 4.6 Ratio (ref <=5.0)
Triglycerides: 136 mg/dL (ref <150)
VLDL: 27 mg/dL (ref <30)

## 2015-10-26 LAB — VITAMIN D 25 HYDROXY (VIT D DEFICIENCY, FRACTURES): VIT D 25 HYDROXY: 65 ng/mL (ref 30–100)

## 2015-10-26 LAB — INSULIN, FASTING: Insulin fasting, serum: 8 u[IU]/mL (ref 2.0–19.6)

## 2015-10-26 LAB — MAGNESIUM: Magnesium: 2 mg/dL (ref 1.5–2.5)

## 2015-10-26 LAB — TSH: TSH: 1.225 u[IU]/mL (ref 0.350–4.500)

## 2015-11-16 ENCOUNTER — Encounter: Payer: Self-pay | Admitting: Gastroenterology

## 2015-12-07 ENCOUNTER — Encounter: Payer: Self-pay | Admitting: Physician Assistant

## 2015-12-07 MED ORDER — ROPINIROLE HCL 1 MG PO TABS: 1.0000 mg | ORAL_TABLET | Freq: Three times a day (TID) | ORAL | Status: DC

## 2015-12-07 MED FILL — rOPINIRole HCL 1 MG TABS: 1 | 90 days supply | Qty: 270 | Fill #0

## 2016-02-08 ENCOUNTER — Other Ambulatory Visit: Payer: Self-pay | Admitting: Internal Medicine

## 2016-02-08 MED FILL — LISINOPRIL 10 MG TABLET: 10 | 90 days supply | Qty: 90 | Fill #0

## 2016-03-26 ENCOUNTER — Telehealth: Payer: Self-pay | Admitting: Physician Assistant

## 2016-03-26 MED ORDER — PREDNISONE 20 MG PO TABS: ORAL_TABLET | ORAL | Status: DC

## 2016-03-26 MED ORDER — AZITHROMYCIN 250 MG PO TABS: ORAL_TABLET | ORAL | Status: AC

## 2016-03-26 MED FILL — AZITHROMYCIN 250 MG TABLET: 250 | 5 days supply | Qty: 6 | Fill #0

## 2016-03-26 NOTE — Telephone Encounter (Signed)
LVM letting pt know of Rx to pick up & Mychart message. I stated that if the pt had any ?s or concerns to please give the office a call & if her sxs got any worse to please go to the ER per Vicie Mutters.

## 2016-03-26 NOTE — Telephone Encounter (Signed)
Patient called with cough, fever, chills, chest and sinus congestion x 7 days, OTC meds without relief.  Will send my chart message with information, fill zpak and prednisone, follow up in office if not better.

## 2016-04-05 ENCOUNTER — Ambulatory Visit (INDEPENDENT_AMBULATORY_CARE_PROVIDER_SITE_OTHER): Payer: 59 | Admitting: Physician Assistant

## 2016-04-05 ENCOUNTER — Encounter: Payer: Self-pay | Admitting: Physician Assistant

## 2016-04-05 VITALS — BP 148/82 | HR 70 | Temp 97.8°F | Resp 16 | Ht 65.5 in | Wt 161.0 lb

## 2016-04-05 DIAGNOSIS — N182 Chronic kidney disease, stage 2 (mild): Secondary | ICD-10-CM

## 2016-04-05 DIAGNOSIS — Z23 Encounter for immunization: Secondary | ICD-10-CM

## 2016-04-05 DIAGNOSIS — G2581 Restless legs syndrome: Secondary | ICD-10-CM | POA: Diagnosis not present

## 2016-04-05 DIAGNOSIS — Z79899 Other long term (current) drug therapy: Secondary | ICD-10-CM | POA: Diagnosis not present

## 2016-04-05 DIAGNOSIS — E1140 Type 2 diabetes mellitus with diabetic neuropathy, unspecified: Secondary | ICD-10-CM | POA: Insufficient documentation

## 2016-04-05 DIAGNOSIS — E559 Vitamin D deficiency, unspecified: Secondary | ICD-10-CM

## 2016-04-05 DIAGNOSIS — E1365 Other specified diabetes mellitus with hyperglycemia: Secondary | ICD-10-CM

## 2016-04-05 DIAGNOSIS — I10 Essential (primary) hypertension: Secondary | ICD-10-CM | POA: Diagnosis not present

## 2016-04-05 DIAGNOSIS — K2100 Gastro-esophageal reflux disease with esophagitis, without bleeding: Secondary | ICD-10-CM

## 2016-04-05 DIAGNOSIS — E1322 Other specified diabetes mellitus with diabetic chronic kidney disease: Secondary | ICD-10-CM | POA: Diagnosis not present

## 2016-04-05 DIAGNOSIS — K5730 Diverticulosis of large intestine without perforation or abscess without bleeding: Secondary | ICD-10-CM

## 2016-04-05 DIAGNOSIS — E785 Hyperlipidemia, unspecified: Secondary | ICD-10-CM | POA: Diagnosis not present

## 2016-04-05 DIAGNOSIS — R6889 Other general symptoms and signs: Secondary | ICD-10-CM

## 2016-04-05 DIAGNOSIS — Z0001 Encounter for general adult medical examination with abnormal findings: Secondary | ICD-10-CM

## 2016-04-05 DIAGNOSIS — E1142 Type 2 diabetes mellitus with diabetic polyneuropathy: Secondary | ICD-10-CM

## 2016-04-05 DIAGNOSIS — IMO0002 Reserved for concepts with insufficient information to code with codable children: Secondary | ICD-10-CM

## 2016-04-05 LAB — CBC WITH DIFFERENTIAL/PLATELET
BASOS ABS: 54 {cells}/uL (ref 0–200)
Basophils Relative: 1 %
EOS ABS: 108 {cells}/uL (ref 15–500)
EOS PCT: 2 %
HEMATOCRIT: 42.4 % (ref 35.0–45.0)
HEMOGLOBIN: 14 g/dL (ref 11.7–15.5)
LYMPHS ABS: 2268 {cells}/uL (ref 850–3900)
Lymphocytes Relative: 42 %
MCH: 29.4 pg (ref 27.0–33.0)
MCHC: 33 g/dL (ref 32.0–36.0)
MCV: 89.1 fL (ref 80.0–100.0)
MPV: 9 fL (ref 7.5–12.5)
Monocytes Absolute: 378 cells/uL (ref 200–950)
Monocytes Relative: 7 %
NEUTROS ABS: 2592 {cells}/uL (ref 1500–7800)
NEUTROS PCT: 48 %
Platelets: 210 10*3/uL (ref 140–400)
RBC: 4.76 MIL/uL (ref 3.80–5.10)
RDW: 13.7 % (ref 11.0–15.0)
WBC: 5.4 10*3/uL (ref 3.8–10.8)

## 2016-04-05 LAB — HEMOGLOBIN A1C
HEMOGLOBIN A1C: 6.5 % — AB (ref <5.7)
MEAN PLASMA GLUCOSE: 140 mg/dL

## 2016-04-05 MED ORDER — ROPINIROLE HCL 1 MG PO TABS: 1.0000 mg | ORAL_TABLET | Freq: Three times a day (TID) | ORAL | Status: DC

## 2016-04-05 MED ORDER — AMITRIPTYLINE HCL 10 MG PO TABS: ORAL_TABLET | ORAL | Status: DC

## 2016-04-05 MED ORDER — FLUCONAZOLE 150 MG PO TABS: 150.0000 mg | ORAL_TABLET | Freq: Every day | ORAL | Status: DC

## 2016-04-05 MED FILL — AMITRIPTYLINE HCL 10 MG TAB: 10 | 30 days supply | Qty: 60 | Fill #0

## 2016-04-05 MED FILL — rOPINIRole HCL 1 MG TABS: 1 | 90 days supply | Qty: 270 | Fill #0

## 2016-04-05 MED FILL — FLUCONAZOLE 150 MG TABLET: 150 | 1 days supply | Qty: 1 | Fill #0

## 2016-04-05 NOTE — Progress Notes (Signed)
Complete Physical  Assessment and Plan: 1. Essential hypertension, benign - continue medications, DASH diet, exercise and monitor at home. Call if greater than 130/80. - GET ON AND STAY ON LISINOPRIL  - CBC with Differential/Platelet - BASIC METABOLIC PANEL WITH GFR - Hepatic function panel - TSH - Urinalysis, Routine w reflex microscopic (not at [facility] [address]) - Microalbumin / creatinine urine ratio - EKG 12-Lead  2. Uncontrolled secondary diabetes mellitus with stage 2 CKD (GFR 60-89) Discussed general issues about diabetes pathophysiology and management., Educational material distributed., Suggested low cholesterol diet., Encouraged aerobic exercise., Discussed foot care., Reminded to get yearly retinal exam. - DUE EYE EXAM - Hemoglobin A1c - Insulin, fasting  3. Restless leg syndrome Continue requip  4. Hyperlipidemia -continue medications, check lipids, decrease fatty foods, increase activity.  - Lipid panel  5. Diverticulosis of large intestine without hemorrhage Remission, increase fiber  6. Vitamin D deficiency - Vit D  25 hydroxy (rtn osteoporosis monitoring)  7. Medication management - Magnesium  8. Gastroesophageal reflux disease with esophagitis Continue PPI/H2 blocker, diet discussed  9. Routine general medical examination at a health care facility Due colonoscopy next year  10. Diabetic autonomic neuropathy associated with type 2 diabetes mellitus Do daily feet checks - LIKELY CAUSE OF LEG PAIN, ADD AMITRIPTYLINE 10MG  AT NIGHT -ELEVATE LEGS AND COMPRESSION STOCKINGS FOR POSSIBLE VARICOSE VEINS.    Discussed med's effects and SE's. Screening labs and tests as requested with regular follow-up as recommended.  HPI 65 y.o. female  presents for a complete physical. Her blood pressure has been controlled at home but she has not been taking her lisinopril consistently due to recent leg pain that she thought could be from that, today their BP is BP: (!) 148/82  mmHg She does workout, walks the dog but having heel pain. She denies chest pain, shortness of breath, dizziness.  She is not on cholesterol medication and denies myalgias. Her cholesterol is at goal. The cholesterol last visit was:   Lab Results  Component Value Date   CHOL 155 10/25/2015   HDL 34* 10/25/2015   LDLCALC 94 10/25/2015   TRIG 136 10/25/2015   CHOLHDL 4.6 10/25/2015   She has been working on diet and exercise for diabetes with CKD stage II but could not tolerate the metformin, she is on bASA, she is on an ACE, and denies polydipsia, polyuria and visual disturbances. She does complain at night, some cramping/lightening pain that keeps her up. Last A1C in the office was:  Lab Results  Component Value Date   HGBA1C 6.6* 10/25/2015   Lab Results  Component Value Date   GFRNONAA 63 10/25/2015   Patient is on Vitamin D supplement.   Lab Results  Component Value Date   VD25OH 65 10/25/2015   She has GERD She is on requip for RLS which helps, on 2 at night and occ 1 during the day  BMI is Body mass index is 26.37 kg/(m^2)., she is working on diet and exercise. Wt Readings from Last 3 Encounters:  04/05/16 161 lb (73.029 kg)  10/25/15 168 lb 6.4 oz (76.386 kg)  09/01/15 167 lb (75.751 kg)     Current Medications:  Current Outpatient Prescriptions on File Prior to Visit  Medication Sig Dispense Refill  . CRANBERRY EXTRACT PO Take 1 tablet by mouth daily.      Marland Kitchen lisinopril (PRINIVIL,ZESTRIL) 10 MG tablet TAKE 1/2 TO 1 TABLET BY MOUTH IN THE MORNING FOR HYPERTENSION/KIDNEY PROTECTION FROM DIABETES 90 tablet 1  .  Multiple Vitamins-Minerals (MULTIVITAMIN PO) Take by mouth daily.    . Phenazopyridine HCl (AZO TABS PO) Take 2 tablets by mouth daily.    Marland Kitchen rOPINIRole (REQUIP) 1 MG tablet Take 1 tablet (1 mg total) by mouth 3 (three) times daily. 270 tablet 0  . UNABLE TO FIND Lite Salt 2-3 times a day    . VITAMIN D, CHOLECALCIFEROL, PO Take 4,000 Units by mouth daily.       No current facility-administered medications on file prior to visit.   Health Maintenance:   Immunization History  Administered Date(s) Administered  . Tdap 03/07/2011   Tetanus: 2012 Pneumovax: will get after prevnar Prevnar 13: 04/05/2016 Flu vaccine: 2015 at work Zostavax: declines  Pap: 2016 negative repeat pap MGM: 04/2015 DEXA: 2008 normal Colonoscopy: 06/2011 due 5 years due 2017, Dr. Deatra Ina EGD: N/A MRI lumbar spine 2013 CXR 2017 Eye exam: 09/2014, does not want to go back to Dr. Bing Plume due to allergy to eye drop.  Dentist: Dr. Harrington Challenger q 6 months  Dr. Lynann Bologna ortho  Allergies:  Allergies  Allergen Reactions  . Hctz [Hydrochlorothiazide]     Leg cramping  . Metformin And Related    Medical History:  Past Medical History  Diagnosis Date  . Arthritis     hips  . Restless leg syndrome   . EKG abnormalities     pt states she had a BBB on her last EKG  . Snoring   . Diet-controlled type 2 diabetes mellitus (HCC)     Diet controlled  . Diverticulosis   . Dribbling urine   . Hyperlipidemia   . Vitamin D deficiency   . Essential hypertension, benign 08/19/2013  . Cataract     bilateral   Surgical History:  She  has past surgical history that includes Vaginal hysterectomy (1992); Tubal ligation (1981); Ovarian cyst removal (1981); Breast biopsy (1969); Colonoscopy; and Lumbar laminectomy/decompression microdiscectomy (05/07/2012).  Family History:  Her family history includes Colon cancer (age of onset: 83) in her mother; Diabetes in her mother; Heart failure in her mother; Heart failure (age of onset: 62) in her father; Hypertension in her mother; Stroke in her mother; Stroke (age of onset: 51) in her sister. There is no history of Stomach cancer or Esophageal cancer.   Social History:  She  reports that she has been passively smoking.  She has never used smokeless tobacco. She reports that she does not drink alcohol or use illicit drugs.  Review of Systems   Constitutional: Negative.   HENT: Negative.   Eyes: Negative.  Negative for blurred vision.  Respiratory: Positive for cough (recent cold improving.). Negative for hemoptysis, sputum production, shortness of breath and wheezing.   Cardiovascular: Negative.  Negative for chest pain.  Gastrointestinal: Negative.   Genitourinary: Negative.        Stress incontinence  Musculoskeletal: Positive for myalgias (bilateral legs). Negative for back pain, joint pain, falls and neck pain.  Skin: Negative for itching and rash.  Neurological: Positive for tingling and sensory change (bilateral feet).  Psychiatric/Behavioral: Negative.      Physical Exam: Estimated body mass index is 26.37 kg/(m^2) as calculated from the following:   Height as of this encounter: 5' 5.5" (1.664 m).   Weight as of this encounter: 161 lb (73.029 kg). BP 148/82 mmHg  Pulse 70  Temp(Src) 97.8 F (36.6 C) (Temporal)  Resp 16  Ht 5' 5.5" (1.664 m)  Wt 161 lb (73.029 kg)  BMI 26.37 kg/m2 General Appearance: Well nourished,  in no apparent distress. Eyes: PERRLA, EOMs, conjunctiva no swelling or erythema, normal fundi and vessels. Sinuses: No Frontal/maxillary tenderness ENT/Mouth: Ext aud canals clear, normal light reflex with TMs without erythema, bulging.  Good dentition. No erythema, swelling, or exudate on post pharynx. Tonsils not swollen or erythematous. Hearing normal.  Neck: Supple, thyroid normal. No bruits Respiratory: Respiratory effort normal, BS equal bilaterally without rales, rhonchi, wheezing or stridor. Cardio: RRR without murmurs, rubs or gallops. Brisk peripheral pulses without edema.  Chest: symmetric, with normal excursions and percussion. Breasts: Symmetric, without lumps, nipple discharge, retractions. Abdomen: Soft, +BS, Non tender, no guarding, rebound, hernias, masses, or organomegaly. .  Lymphatics: Non tender without lymphadenopathy.  Genitourinary: defer Musculoskeletal: Full ROM all  peripheral extremities,5/5 strength, and normal gait.  Skin: + varicose veins bilateral legs. Warm, dry without rashes, lesions, ecchymosis.  Neuro: Cranial nerves intact, reflexes equal bilaterally. Normal muscle tone, no cerebellar symptoms. Sensation decreased bilateral feet to mid shin.  Psych: Awake and oriented X 3, normal affect, Insight and Judgment appropriate.   EKG: WNL no changes, IRBBB AORTA SCAN: defer   Vicie Mutters 9:45 AM

## 2016-04-05 NOTE — Patient Instructions (Signed)
Monitor your blood pressure at home. Go to the ER if any CP, SOB, nausea, dizziness, severe HA, changes vision/speech  Goal BP:  For patients younger than 60: Goal BP < 140/90. For patients 60 and older: Goal BP < 150/90. For patients with diabetes: Goal BP < 140/90. Your most recent BP: BP: (!) 148/82 mmHg   Take your medications faithfully as instructed. Maintain a healthy weight. Get at least 150 minutes of aerobic exercise per week. Minimize salt intake. Minimize alcohol intake  DASH Eating Plan DASH stands for "Dietary Approaches to Stop Hypertension." The DASH eating plan is a healthy eating plan that has been shown to reduce high blood pressure (hypertension). Additional health benefits may include reducing the risk of type 2 diabetes mellitus, heart disease, and stroke. The DASH eating plan may also help with weight loss. WHAT DO I NEED TO KNOW ABOUT THE DASH EATING PLAN? For the DASH eating plan, you will follow these general guidelines:  Choose foods with a percent daily value for sodium of less than 5% (as listed on the food label).  Use salt-free seasonings or herbs instead of table salt or sea salt.  Check with your health care provider or pharmacist before using salt substitutes.  Eat lower-sodium products, often labeled as "lower sodium" or "no salt added."  Eat fresh foods.  Eat more vegetables, fruits, and low-fat dairy products.  Choose whole grains. Look for the word "whole" as the first word in the ingredient list.  Choose fish and skinless chicken or [place] more often than red meat. Limit fish, poultry, and meat to 6 oz (170 g) each day.  Limit sweets, desserts, sugars, and sugary drinks.  Choose heart-healthy fats.  Limit cheese to 1 oz (28 g) per day.  Eat more home-cooked food and less restaurant, buffet, and fast food.  Limit fried foods.  Cook foods using methods other than frying.  Limit canned vegetables. If you do use them, rinse them well  to decrease the sodium.  When eating at a restaurant, ask that your food be prepared with less salt, or no salt if possible. WHAT FOODS CAN I EAT? Seek help from a dietitian for individual calorie needs. Grains Whole grain or whole wheat bread. Brown rice. Whole grain or whole wheat pasta. Quinoa, bulgur, and whole grain cereals. Low-sodium cereals. Corn or whole wheat flour tortillas. Whole grain cornbread. Whole grain crackers. Low-sodium crackers. Vegetables Fresh or frozen vegetables (raw, steamed, roasted, or grilled). Low-sodium or reduced-sodium tomato and vegetable juices. Low-sodium or reduced-sodium tomato sauce and paste. Low-sodium or reduced-sodium canned vegetables.  Fruits All fresh, canned (in natural juice), or frozen fruits. Meat and Other Protein Products Ground beef (85% or leaner), grass-fed beef, or beef trimmed of fat. Skinless chicken or [place]. Ground chicken or [place]. Pork trimmed of fat. All fish and seafood. Eggs. Dried beans, peas, or lentils. Unsalted nuts and seeds. Unsalted canned beans. Dairy Low-fat dairy products, such as skim or 1% milk, 2% or reduced-fat cheeses, low-fat ricotta or cottage cheese, or plain low-fat yogurt. Low-sodium or reduced-sodium cheeses. Fats and Oils Tub margarines without trans fats. Light or reduced-fat mayonnaise and salad dressings (reduced sodium). Avocado. Safflower, olive, or canola oils. Natural peanut or almond butter. Other Unsalted popcorn and pretzels. The items listed above may not be a complete list of recommended foods or beverages. Contact your dietitian for more options. WHAT FOODS ARE NOT RECOMMENDED? Grains White bread. White pasta. White rice. Refined cornbread. Bagels and croissants. Crackers that  contain trans fat. Vegetables Creamed or fried vegetables. Vegetables in a cheese sauce. Regular canned vegetables. Regular canned tomato sauce and paste. Regular tomato and vegetable juices. Fruits Dried fruits.  Canned fruit in light or heavy syrup. Fruit juice. Meat and Other Protein Products Fatty cuts of meat. Ribs, chicken wings, bacon, sausage, bologna, salami, chitterlings, fatback, hot dogs, bratwurst, and packaged luncheon meats. Salted nuts and seeds. Canned beans with salt. Dairy Whole or 2% milk, cream, half-and-half, and cream cheese. Whole-fat or sweetened yogurt. Full-fat cheeses or blue cheese. Nondairy creamers and whipped toppings. Processed cheese, cheese spreads, or cheese curds. Condiments Onion and garlic salt, seasoned salt, table salt, and sea salt. Canned and packaged gravies. Worcestershire sauce. Tartar sauce. Barbecue sauce. Teriyaki sauce. Soy sauce, including reduced sodium. Steak sauce. Fish sauce. Oyster sauce. Cocktail sauce. Horseradish. Ketchup and mustard. Meat flavorings and tenderizers. Bouillon cubes. Hot sauce. Tabasco sauce. Marinades. Taco seasonings. Relishes. Fats and Oils Butter, stick margarine, lard, shortening, ghee, and bacon fat. Coconut, palm kernel, or palm oils. Regular salad dressings. Other Pickles and olives. Salted popcorn and pretzels. The items listed above may not be a complete list of foods and beverages to avoid. Contact your dietitian for more information. WHERE CAN I FIND MORE INFORMATION? National Heart, Lung, and Blood Institute: travelstabloid.com Document Released: 09/19/2011 Document Revised: 02/14/2014 Document Reviewed: 08/04/2013 [facility] Patient Information 2015 [place], [place]. This information is not intended to replace advice given to you by your health care provider. Make sure you discuss any questions you have with your health care provider.  Peripheral Neuropathy Peripheral neuropathy is a type of nerve damage. It affects nerves that carry signals between the spinal cord and other parts of the body. These are called peripheral nerves. With peripheral neuropathy, one nerve or a group of nerves may  be damaged.  CAUSES  Many things can damage peripheral nerves. For some people with peripheral neuropathy, the cause is unknown. Some causes include:  Diabetes. This is the most common cause of peripheral neuropathy.  Injury to a nerve.  Pressure or stress on a nerve that lasts a long time.  Too little vitamin B. Alcoholism can lead to this.  Infections.  Autoimmune diseases, such as multiple sclerosis and systemic lupus erythematosus.  Inherited nerve diseases.  Some medicines, such as cancer drugs.  Toxic substances, such as lead and mercury.  Too little blood flowing to the legs.  Kidney disease.  Thyroid disease. SIGNS AND SYMPTOMS  Different people have different symptoms. The symptoms you have will depend on which of your nerves is damaged. Common symptoms include:  Loss of feeling (numbness) in the feet and hands.  Tingling in the feet and hands.  Pain that burns.  Very sensitive skin.  Weakness.  Not being able to move a part of the body (paralysis).  Muscle twitching.  Clumsiness or poor coordination.  Loss of balance.  Not being able to control your bladder.  Feeling dizzy.  Sexual problems. DIAGNOSIS  Peripheral neuropathy is a symptom, not a disease. Finding the cause of peripheral neuropathy can be hard. To figure that out, your health care provider will take a medical history and do a physical exam. A neurological exam will also be done. This involves checking things affected by your brain, spinal cord, and nerves (nervous system). For example, your health care provider will check your reflexes, how you move, and what you can feel.  Other types of tests may also be ordered, such as:  Blood tests.  A test of the fluid in your spinal cord.  Imaging tests, such as CT scans or an MRI.  Electromyography (EMG). This test checks the nerves that control muscles.  Nerve conduction velocity tests. These tests check how fast messages pass through  your nerves.  Nerve biopsy. A small piece of nerve is removed. It is then checked under a microscope. TREATMENT   Medicine is often used to treat peripheral neuropathy. Medicines may include:  Pain-relieving medicines. Prescription or over-the-counter medicine may be suggested.  Antiseizure medicine. This may be used for pain.  Antidepressants. These also may help ease pain from neuropathy.  Lidocaine. This is a numbing medicine. You might wear a patch or be given a shot.  Mexiletine. This medicine is typically used to help control irregular heart rhythms.  Surgery. Surgery may be needed to relieve pressure on a nerve or to destroy a nerve that is causing pain.  Physical therapy to help movement.  Assistive devices to help movement. HOME CARE INSTRUCTIONS   Only take over-the-counter or prescription medicines as directed by your health care provider. Follow the instructions carefully for any given medicines. Do not take any other medicines without first getting approval from your health care provider.  If you have diabetes, work closely with your health care provider to keep your blood sugar under control.  If you have numbness in your feet:  Check every day for signs of injury or infection. Watch for redness, warmth, and swelling.  Wear padded socks and comfortable shoes. These help protect your feet.  Do not do things that put pressure on your damaged nerve.  Do not smoke. Smoking keeps blood from getting to damaged nerves.  Avoid or limit alcohol. Too much alcohol can cause a lack of B vitamins. These vitamins are needed for healthy nerves.  Develop a good support system. Coping with peripheral neuropathy can be stressful. Talk to a mental health specialist or join a support group if you are struggling.  Follow up with your health care provider as directed. SEEK MEDICAL CARE IF:   You have new signs or symptoms of peripheral neuropathy.  You are struggling  emotionally from dealing with peripheral neuropathy.  You have a fever. SEEK IMMEDIATE MEDICAL CARE IF:   You have an injury or infection that is not healing.  You feel very dizzy or begin vomiting.  You have chest pain.  You have trouble breathing.   This information is not intended to replace advice given to you by your health care provider. Make sure you discuss any questions you have with your health care provider.   Document Released: 09/20/2002 Document Revised: 06/12/2011 Document Reviewed: 06/07/2013 Elsevier Interactive Patient Education 2016 [employer]   Varicose Veins Varicose veins are veins that have become enlarged and twisted. CAUSES This condition is the result of valves in the veins not working properly. Valves in the veins help return blood from the leg to the heart. When your calf muscles squeeze, the blood moves up your leg then the valves close and this continues until the blood gets back to your heart.  If these valves are damaged, blood flows backwards and backs up into the veins in the leg near the skin OR if your are sitting/standing for a long time without using your calf muscles the blood will back up into the veins in your legs. This causes the veins to become larger. People who are on their feet a lot, sit a lot without walking (like on a  plane, at a desk, or in a car), who are pregnant, or who are overweight are more likely to develop varicose veins. SYMPTOMS   Bulging, twisted-appearing, bluish veins, most commonly found on the legs.  Leg pain or a feeling of heaviness. These symptoms may be worse at the end of the day.  Leg swelling.  Skin color changes. DIAGNOSIS  Varicose veins can usually be diagnosed with an exam of your legs by your caregiver. He or she may recommend an ultrasound of your leg veins. TREATMENT  Most varicose veins can be treated at home.However, other treatments are available for people who have persistent symptoms or who  want to treat the cosmetic appearance of the varicose veins. But this is only cosmetic and they will return if not properly treated. These include:  Laser treatment of very small varicose veins.  Medicine that is shot (injected) into the vein. This medicine hardens the walls of the vein and closes off the vein. This treatment is called sclerotherapy. Afterwards, you may need to wear clothing or bandages that apply pressure.  Surgery. HOME CARE INSTRUCTIONS   Do not stand or sit in one position for long periods of time. Do not sit with your legs crossed. Rest with your legs raised during the day.  Your legs have to be higher than your heart so that gravity will force the valves to open, so please really elevate your legs.   Wear elastic stockings or support hose. Do not wear other tight, encircling garments around the legs, pelvis, or waist.  ELASTIC THERAPY  has a wide variety of well priced compression stockings. [place], [place] [address] [address] [place] ARE COPPER INFUSED COMPRESSION SOCKS AT [facility] OR [facility]  Walk as much as possible to increase blood flow.  Raise the foot of your bed at night with 2-inch blocks.  If you get a cut in the skin over the vein and the vein bleeds, lie down with your leg raised and press on it with a clean cloth until the bleeding stops. Then place a bandage (dressing) on the cut. See your caregiver if it continues to bleed or needs stitches. SEEK MEDICAL CARE IF:   The skin around your ankle starts to break down.  You have pain, redness, tenderness, or hard swelling developing in your leg over a vein.  You are uncomfortable due to leg pain. Document Released: 07/10/2005 Document Revised: 12/23/2011 Document Reviewed: 11/26/2010 [facility] Patient Information 2014 [place].

## 2016-04-06 LAB — BASIC METABOLIC PANEL WITH GFR
BUN: 20 mg/dL (ref 7–25)
CALCIUM: 9.5 mg/dL (ref 8.6–10.4)
CHLORIDE: 104 mmol/L (ref 98–110)
CO2: 27 mmol/L (ref 20–31)
CREATININE: 1.07 mg/dL — AB (ref 0.50–0.99)
GFR, Est African American: 63 mL/min (ref >=60)
GFR, Est Non African American: 55 mL/min — ABNORMAL LOW (ref >=60)
GLUCOSE: 95 mg/dL (ref 65–99)
Potassium: 4.5 mmol/L (ref 3.5–5.3)
Sodium: 140 mmol/L (ref 135–146)

## 2016-04-06 LAB — LIPID PANEL
CHOL/HDL RATIO: 4.1 ratio (ref <=5.0)
CHOLESTEROL: 150 mg/dL (ref 125–200)
HDL: 37 mg/dL — ABNORMAL LOW (ref >=46)
LDL Cholesterol: 83 mg/dL (ref <130)
Triglycerides: 151 mg/dL — ABNORMAL HIGH (ref <150)
VLDL: 30 mg/dL (ref <30)

## 2016-04-06 LAB — HEPATIC FUNCTION PANEL
ALT: 19 U/L (ref 6–29)
AST: 16 U/L (ref 10–35)
Albumin: 4.1 g/dL (ref 3.6–5.1)
Alkaline Phosphatase: 65 U/L (ref 33–130)
BILIRUBIN DIRECT: 0.1 mg/dL (ref <=0.2)
Indirect Bilirubin: 0.4 mg/dL (ref 0.2–1.2)
TOTAL PROTEIN: 7.3 g/dL (ref 6.1–8.1)
Total Bilirubin: 0.5 mg/dL (ref 0.2–1.2)

## 2016-04-06 LAB — URINALYSIS, ROUTINE W REFLEX MICROSCOPIC
BILIRUBIN URINE: NEGATIVE
Glucose, UA: NEGATIVE
Hgb urine dipstick: NEGATIVE
KETONES UR: NEGATIVE
Leukocytes, UA: NEGATIVE
Nitrite: NEGATIVE
PH: 6.5 (ref 5.0–8.0)
Protein, ur: NEGATIVE
SPECIFIC GRAVITY, URINE: 1.007 (ref 1.001–1.035)

## 2016-04-06 LAB — MAGNESIUM: MAGNESIUM: 2.1 mg/dL (ref 1.5–2.5)

## 2016-04-06 LAB — INSULIN, FASTING: INSULIN FASTING, SERUM: 5.9 u[IU]/mL (ref 2.0–19.6)

## 2016-04-06 LAB — VITAMIN D 25 HYDROXY (VIT D DEFICIENCY, FRACTURES): Vit D, 25-Hydroxy: 78 ng/mL (ref 30–100)

## 2016-04-06 LAB — MICROALBUMIN / CREATININE URINE RATIO
CREATININE, URINE: 25 mg/dL (ref 20–320)
Microalb, Ur: <0.2 mg/dL

## 2016-04-06 LAB — TSH: TSH: 1.35 m[IU]/L

## 2016-04-11 ENCOUNTER — Other Ambulatory Visit: Payer: Self-pay | Admitting: Physician Assistant

## 2016-04-11 DIAGNOSIS — Z1231 Encounter for screening mammogram for malignant neoplasm of breast: Secondary | ICD-10-CM

## 2016-05-01 ENCOUNTER — Encounter: Payer: Self-pay | Admitting: Internal Medicine

## 2016-05-06 ENCOUNTER — Encounter: Payer: Self-pay | Admitting: Gastroenterology

## 2016-05-08 ENCOUNTER — Ambulatory Visit
Admission: RE | Admit: 2016-05-08 | Discharge: 2016-05-08 | Disposition: A | Discharge status: Home / Self Care | Payer: 59 | Source: Ambulatory Visit | Attending: Physician Assistant | Admitting: Physician Assistant

## 2016-05-08 DIAGNOSIS — Z1231 Encounter for screening mammogram for malignant neoplasm of breast: Secondary | ICD-10-CM

## 2016-05-13 ENCOUNTER — Encounter: Payer: Self-pay | Admitting: Gastroenterology

## 2016-05-20 ENCOUNTER — Encounter (INDEPENDENT_AMBULATORY_CARE_PROVIDER_SITE_OTHER): Payer: 59 | Admitting: Ophthalmology

## 2016-05-20 DIAGNOSIS — H4423 Degenerative myopia, bilateral: Secondary | ICD-10-CM

## 2016-05-20 DIAGNOSIS — H43813 Vitreous degeneration, bilateral: Secondary | ICD-10-CM

## 2016-06-28 ENCOUNTER — Ambulatory Visit: Payer: 59 | Admitting: *Deleted

## 2016-06-28 VITALS — Ht 65.0 in | Wt 165.6 lb

## 2016-06-28 DIAGNOSIS — Z86010 Personal history of colonic polyps: Secondary | ICD-10-CM

## 2016-06-28 MED ORDER — SUPREP BOWEL PREP KIT 17.5-3.13-1.6 GM/177ML PO SOLN: 1.0000 | Freq: Once | ORAL | 0 refills | Status: AC

## 2016-06-28 MED FILL — SUPREP BOWEL PREP KIT: 17.5-3.13-1 | 1 days supply | Qty: 354 | Fill #0

## 2016-07-03 ENCOUNTER — Ambulatory Visit (INDEPENDENT_AMBULATORY_CARE_PROVIDER_SITE_OTHER): Payer: 59 | Admitting: Physician Assistant

## 2016-07-03 ENCOUNTER — Encounter: Payer: Self-pay | Admitting: Physician Assistant

## 2016-07-03 VITALS — BP 124/60 | HR 67 | Temp 97.5°F | Ht 65.0 in | Wt 167.2 lb

## 2016-07-03 DIAGNOSIS — N182 Chronic kidney disease, stage 2 (mild): Secondary | ICD-10-CM

## 2016-07-03 DIAGNOSIS — IMO0002 Reserved for concepts with insufficient information to code with codable children: Secondary | ICD-10-CM

## 2016-07-03 DIAGNOSIS — Z79899 Other long term (current) drug therapy: Secondary | ICD-10-CM

## 2016-07-03 DIAGNOSIS — E785 Hyperlipidemia, unspecified: Secondary | ICD-10-CM | POA: Diagnosis not present

## 2016-07-03 DIAGNOSIS — R6884 Jaw pain: Secondary | ICD-10-CM

## 2016-07-03 DIAGNOSIS — E1365 Other specified diabetes mellitus with hyperglycemia: Secondary | ICD-10-CM | POA: Diagnosis not present

## 2016-07-03 DIAGNOSIS — I10 Essential (primary) hypertension: Secondary | ICD-10-CM | POA: Diagnosis not present

## 2016-07-03 DIAGNOSIS — E1322 Other specified diabetes mellitus with diabetic chronic kidney disease: Secondary | ICD-10-CM

## 2016-07-03 LAB — BASIC METABOLIC PANEL WITH GFR
BUN: 16 mg/dL (ref 7–25)
CALCIUM: 9.2 mg/dL (ref 8.6–10.4)
CHLORIDE: 107 mmol/L (ref 98–110)
CO2: 27 mmol/L (ref 20–31)
Creat: 1.05 mg/dL — ABNORMAL HIGH (ref 0.50–0.99)
GFR, EST NON AFRICAN AMERICAN: 56 mL/min — AB (ref >=60)
GFR, Est African American: 64 mL/min (ref >=60)
GLUCOSE: 87 mg/dL (ref 65–99)
Potassium: 4.7 mmol/L (ref 3.5–5.3)
SODIUM: 143 mmol/L (ref 135–146)

## 2016-07-03 LAB — CBC WITH DIFFERENTIAL/PLATELET
BASOS ABS: 0 {cells}/uL (ref 0–200)
Basophils Relative: 0 %
EOS PCT: 3 %
Eosinophils Absolute: 174 cells/uL (ref 15–500)
HEMATOCRIT: 42.2 % (ref 35.0–45.0)
HEMOGLOBIN: 14 g/dL (ref 11.7–15.5)
LYMPHS ABS: 2494 {cells}/uL (ref 850–3900)
Lymphocytes Relative: 43 %
MCH: 29.2 pg (ref 27.0–33.0)
MCHC: 33.2 g/dL (ref 32.0–36.0)
MCV: 87.9 fL (ref 80.0–100.0)
MONO ABS: 348 {cells}/uL (ref 200–950)
MPV: 9.9 fL (ref 7.5–12.5)
Monocytes Relative: 6 %
NEUTROS ABS: 2784 {cells}/uL (ref 1500–7800)
NEUTROS PCT: 48 %
Platelets: 170 10*3/uL (ref 140–400)
RBC: 4.8 MIL/uL (ref 3.80–5.10)
RDW: 13.8 % (ref 11.0–15.0)
WBC: 5.8 10*3/uL (ref 3.8–10.8)

## 2016-07-03 LAB — LIPID PANEL
CHOL/HDL RATIO: 3.6 ratio (ref <=5.0)
Cholesterol: 151 mg/dL (ref 125–200)
HDL: 42 mg/dL — ABNORMAL LOW (ref >=46)
LDL CALC: 87 mg/dL (ref <130)
Triglycerides: 112 mg/dL (ref <150)
VLDL: 22 mg/dL (ref <30)

## 2016-07-03 LAB — HEPATIC FUNCTION PANEL
ALT: 15 U/L (ref 6–29)
AST: 18 U/L (ref 10–35)
Albumin: 4.2 g/dL (ref 3.6–5.1)
Alkaline Phosphatase: 65 U/L (ref 33–130)
BILIRUBIN DIRECT: 0.1 mg/dL (ref <=0.2)
BILIRUBIN INDIRECT: 0.3 mg/dL (ref 0.2–1.2)
TOTAL PROTEIN: 7.3 g/dL (ref 6.1–8.1)
Total Bilirubin: 0.4 mg/dL (ref 0.2–1.2)

## 2016-07-03 LAB — TSH: TSH: 2.38 mIU/L

## 2016-07-03 NOTE — Progress Notes (Signed)
Assessment and Plan:   Hypertension -Continue medication, monitor blood pressure at home. Continue DASH diet.  Reminder to go to the ER if any CP, SOB, nausea, dizziness, severe HA, changes vision/speech, left arm numbness and tingling and jaw pain.   Cholesterol -Continue diet and exercise. Check cholesterol.   Diabetes with CKD stage 2 GFR 63 last visit -Continue diet and exercise. Check A1C  Jaw Pain  No pain with exertion, no accompaniments, nontender temple/scalp, clearly mechanical.  No signs of infection Take flexeril at night, heat, massage, no gum If any changes, gets worse come into the office or go to ER   Continue diet and meds as discussed. Further disposition pending results of labs. Over 30 minutes of exam, counseling, chart review, and critical decision making was performed  Future Appointments Date Time Provider [place]  07/12/2016 8:00 AM Nelida Meuse III, MD [facility]  04/09/2017 9:00 AM Vicie Mutters, PA-C [id number] None     HPI 65 y.o. female  presents for 3 month follow up on hypertension, cholesterol, prediabetes, and vitamin D deficiency.   Her blood pressure has been controlled at home, today their BP is BP: 124/60  She does not workout. She denies chest pain, shortness of breath, dizziness.  She is not on cholesterol medication and denies myalgias. Her cholesterol is not at goal. The cholesterol last visit was:   Lab Results  Component Value Date   CHOL 150 04/05/2016   HDL 37 (L) 04/05/2016   LDLCALC 83 04/05/2016   TRIG 151 (H) 04/05/2016   CHOLHDL 4.1 04/05/2016   She has been working on diet and exercise for Diabetes with diabetic chronic kidney disease, she is on bASA, she is on ACE/ARB, does not check sugars at home, and denies paresthesia of the feet, polydipsia, polyuria and visual disturbances. Last A1C was:  Lab Results  Component Value Date   HGBA1C 6.5 (H) 04/05/2016  Patient is on Vitamin D supplement.   Lab  Results  Component Value Date   VD25OH 78 04/05/2016   She has been having left jaw pain, states she has hit her teeth with a fork and it did not hurt. Has had left jaw/neck pain x last Thursday, jaw popped and started to have pain, it is improving, worse with moving her tongue, this AM shooting up back of ear, was worse with chewing. No fever or chills.   Current Medications:  Current Outpatient Prescriptions on File Prior to Visit  Medication Sig Dispense Refill  . aspirin EC 81 MG tablet Take 81 mg by mouth daily.    Marland Kitchen CRANBERRY EXTRACT PO Take 1 tablet by mouth daily.      . cyclobenzaprine (FLEXERIL) 10 MG tablet Take 10 mg by mouth at bedtime.     Marland Kitchen ibuprofen (ADVIL,MOTRIN) 600 MG tablet Take 600 mg by mouth every morning.    Marland Kitchen lisinopril (PRINIVIL,ZESTRIL) 10 MG tablet TAKE 1/2 TO 1 TABLET BY MOUTH IN THE MORNING FOR HYPERTENSION/KIDNEY PROTECTION FROM DIABETES 90 tablet 1  . Multiple Vitamins-Minerals (MULTIVITAMIN PO) Take by mouth daily.    . Phenazopyridine HCl (AZO TABS PO) Take 2 tablets by mouth daily.    Marland Kitchen rOPINIRole (REQUIP) 1 MG tablet Take 1 tablet (1 mg total) by mouth 3 (three) times daily. (Patient taking differently: Take 1 mg by mouth at bedtime. ) 270 tablet 0  . UNABLE TO FIND Lite Salt 2-3 times a day    . VITAMIN D, CHOLECALCIFEROL, PO Take 4,000 Units by  mouth daily.      No current facility-administered medications on file prior to visit.    Medical History:  Past Medical History:  Diagnosis Date  . Allergy    otc med prn  . Arthritis    hips, lower back  . Cataract    bilateral  . Chronic kidney disease    stage 3 per patient   . Diet-controlled type 2 diabetes mellitus (HCC)    Diet controlled, no meds  . Diverticulosis   . Dribbling urine   . EKG abnormalities    pt states she had a BBB on her last EKG, no problems  . Essential hypertension, benign 08/19/2013  . HPV in female   . Hyperlipidemia    diet controlled, no med  . Restless leg  syndrome   . Snoring   . Vitamin D deficiency    Allergies:  Allergies  Allergen Reactions  . Hctz [Hydrochlorothiazide]     Leg cramping  . Metformin And Related Nausea Only     Review of Systems:  Review of Systems  Constitutional: Negative.   HENT: Positive for ear pain.   Eyes: Negative.   Respiratory: Negative.  Negative for shortness of breath and wheezing.   Cardiovascular: Negative for chest pain, palpitations, orthopnea, claudication, leg swelling and PND.       No orthpnea, PND.   Gastrointestinal: Negative.   Genitourinary: Negative.   Musculoskeletal: Negative.   Skin: Negative.   Neurological: Positive for headaches. Negative for dizziness and sensory change.  Endo/Heme/Allergies: Negative.   Psychiatric/Behavioral: Negative.    Family history- Review and unchanged Social history- Review and unchanged Physical Exam: BP 124/60   Pulse 67   Temp 97.5 F (36.4 C)   Ht 5\' 5"  (1.651 m)   Wt 167 lb 3.2 oz (75.8 kg)   SpO2 98%   BMI 27.82 kg/m  Wt Readings from Last 3 Encounters:  07/03/16 167 lb 3.2 oz (75.8 kg)  06/28/16 165 lb 9.6 oz (75.1 kg)  04/05/16 161 lb (73 kg)   General Appearance: Well nourished, in no apparent distress. Eyes: PERRLA, EOMs, conjunctiva no swelling or erythema Sinuses: No Frontal/maxillary tenderness ENT/Mouth: Ext aud canals clear, TMs without erythema, bulging. No erythema, swelling, or exudate on post pharynx.  Tonsils not swollen or erythematous. Hearing normal. + TMJ left worse than right, no abscess.  Neck: Supple, thyroid normal.  Respiratory: Respiratory effort normal, BS equal bilaterally without rales, rhonchi, wheezing or stridor.  Cardio: RRR with no MRGs. Brisk peripheral pulses without edema.  Abdomen: Soft, + BS,  Non tender, no guarding, rebound, hernias, masses. Lymphatics: Non tender without lymphadenopathy.  Musculoskeletal: Full ROM, 5/5 strength, Normal gait Skin: Warm, dry without rashes, lesions,  ecchymosis.  Neuro: Cranial nerves intact. Normal muscle tone, no cerebellar symptoms. Psych: Awake and oriented X 3, normal affect, Insight and Judgment appropriate.    Vicie Mutters, PA-C 11:10 AM [facility]

## 2016-07-03 NOTE — Patient Instructions (Signed)
Heat, massage, no gum, suck on lemon drops  What is the TMJ? The temporomandibular (tem-PUH-ro-man-DIB-yoo-ler) joint, or the TMJ, connects the upper and lower jawbones. This joint allows the jaw to open wide and move back and forth when you chew, talk, or yawn.There are also several muscles that help this joint move. There can be muscle tightness and pain in the muscle that can cause several symptoms.  What causes TMJ pain? There are many causes of TMJ pain. Repeated chewing (for example, chewing gum) and clenching your teeth can cause pain in the joint. Some TMJ pain has no obvious cause. What can I do to ease the pain? There are many things you can do to help your pain get better. When you have pain:  Eat soft foods and stay away from chewy foods (for example, taffy) Try to use both sides of your mouth to chew Don't chew gum Massage Don't open your mouth wide (for example, during yawning or singing) Don't bite your cheeks or fingernails Lower your amount of stress and worry Applying a warm, damp washcloth to the joint may help. Over-the-counter pain medicines such as ibuprofen (one brand: Advil) or acetaminophen (one brand: Tylenol) might also help. Do not use these medicines if you are allergic to them or if your doctor told you not to use them. How can I stop the pain from coming back? When your pain is better, you can do these exercises to make your muscles stronger and to keep the pain from coming back:  Resisted mouth opening: Place your thumb or two fingers under your chin and open your mouth slowly, pushing up lightly on your chin with your thumb. Hold for three to six seconds. Close your mouth slowly. Resisted mouth closing: Place your thumbs under your chin and your two index fingers on the ridge between your mouth and the bottom of your chin. Push down lightly on your chin as you close your mouth. Tongue up: Slowly open and close your mouth while keeping the tongue touching the  roof of the mouth. Side-to-side jaw movement: Place an object about one fourth of an inch thick (for example, two tongue depressors) between your front teeth. Slowly move your jaw from side to side. Increase the thickness of the object as the exercise becomes easier Forward jaw movement: Place an object about one fourth of an inch thick between your front teeth and move the bottom jaw forward so that the bottom teeth are in front of the top teeth. Increase the thickness of the object as the exercise becomes easier. These exercises should not be painful. If it hurts to do these exercises, stop doing them and talk to your family doctor.     Parotitis Parotitis is soreness and inflammation of one or both parotid glands. The parotid glands produce saliva. They are located on each side of the face, below and in front of the earlobes. The saliva produced comes out of tiny openings (ducts) inside the cheeks. In most cases, parotitis goes away over time or with treatment. If your parotitis is caused by certain long-term (chronic) diseases, it may come back again.  CAUSES  Parotitis can be caused by:  Viral infections. Mumps is one viral infection that can cause parotitis.  Bacterial infections.  Blockage of the salivary ducts due to a salivary stone.  Narrowing of the salivary ducts.  Swelling of the salivary ducts.  Dehydration.  Autoimmune conditions, such as sarcoidosis or Sjogren syndrome.  Air from activities such as  scuba diving, glass blowing, or playing an instrument (rare).  Human immunodeficiency virus (HIV) or acquired immunodeficiency syndrome (AIDS).  Tuberculosis. SIGNS AND SYMPTOMS   The ears may appear to be pushed up and out from their normal position.  Redness (erythema) of the skin over the parotid glands.  Pain and tenderness over the parotid glands.  Swelling in the parotid gland area.  Yellowish-white fluid (pus) coming from the ducts inside the cheeks.  Dry  mouth.  Bad taste in the mouth. DIAGNOSIS  Your health care provider may determine that you have parotitis based on your symptoms and a physical exam. A sample of fluid may also be taken from the parotid gland and tested to find the cause of your infection. X-rays or computed tomography (CT) scans may be taken if your health care provider thinks you might have a salivary stone blocking your salivary duct. TREATMENT  Treatment varies depending upon the cause of your parotitis. If your parotitis is caused by mumps, no treatment is needed. The condition will go away on its own after 7 to 10 days. In other cases, treatment may include:  Antibiotic medicine if your infection was caused by bacteria.  Pain medicines.  Gland massage.  Eating sour candy to increase your saliva production.  Removal of salivary stones. Your health care provider may flush stones out with fluids or remove them with tweezers.  Surgery to remove the parotid glands. HOME CARE INSTRUCTIONS   If you were prescribed an antibiotic medicine, finish it all even if you start to feel better.  Put warm compresses on the sore area.  Take medicines only as directed by your health care provider.  Drink enough fluids to keep your urine clear or pale yellow. SEEK IMMEDIATE MEDICAL CARE IF:   You have increasing pain or swelling that is not controlled with medicine.  You have a fever. MAKE SURE YOU:  Understand these instructions.  Will watch your condition.  Will get help right away if you are not doing well or get worse.   This information is not intended to replace advice given to you by your health care provider. Make sure you discuss any questions you have with your health care provider.   Document Released: 03/22/2002 Document Revised: 10/21/2014 Document Reviewed: 02/23/2015 Elsevier Interactive Patient Education [employer].

## 2016-07-04 LAB — HEMOGLOBIN A1C
HEMOGLOBIN A1C: 6.2 % — AB (ref <5.7)
MEAN PLASMA GLUCOSE: 131 mg/dL

## 2016-07-12 ENCOUNTER — Encounter: Payer: Self-pay | Admitting: Gastroenterology

## 2016-07-12 ENCOUNTER — Ambulatory Visit (AMBULATORY_SURGERY_CENTER): Payer: 59 | Admitting: Gastroenterology

## 2016-07-12 VITALS — BP 135/78 | HR 74 | Temp 96.9°F | Resp 15 | Ht 65.0 in | Wt 165.0 lb

## 2016-07-12 DIAGNOSIS — I10 Essential (primary) hypertension: Secondary | ICD-10-CM | POA: Diagnosis not present

## 2016-07-12 DIAGNOSIS — E119 Type 2 diabetes mellitus without complications: Secondary | ICD-10-CM | POA: Diagnosis not present

## 2016-07-12 DIAGNOSIS — Z86010 Personal history of colonic polyps: Secondary | ICD-10-CM | POA: Diagnosis present

## 2016-07-12 LAB — GLUCOSE, CAPILLARY
GLUCOSE-CAPILLARY: 128 mg/dL — AB (ref 65–99)
GLUCOSE-CAPILLARY: 134 mg/dL — AB (ref 65–99)

## 2016-07-12 MED ORDER — SODIUM CHLORIDE 0.9 % IV SOLN
500.0000 mL | INTRAVENOUS | Status: DC
Start: 2016-07-12 — End: 2017-08-01

## 2016-07-12 NOTE — Progress Notes (Signed)
To recovery, report to McCoy, RN, VSS 

## 2016-07-12 NOTE — Op Note (Signed)
[place] Patient Name: Tabbie Marc Procedure Date: 07/12/2016 8:01 AM MRN: [id number] Endoscopist: Mallie Mussel L. Loletha Carrow , MD Age: 65 Referring MD:  Date of Birth: 07-07-1951 Gender: Female Account #: [id number] Procedure:                Colonoscopy Indications:              Surveillance: Personal history of adenomatous                            polyps on last colonoscopy 5 years ago Medicines:                Monitored Anesthesia Care Procedure:                Pre-Anesthesia Assessment:                           - Prior to the procedure, a History and Physical                            was performed, and patient medications and                            allergies were reviewed. The patient's tolerance of                            previous anesthesia was also reviewed. The risks                            and benefits of the procedure and the sedation                            options and risks were discussed with the patient.                            All questions were answered, and informed consent                            was obtained. Prior Anticoagulants: The patient has                            taken no previous anticoagulant or antiplatelet                            agents. ASA Grade Assessment: II - A patient with                            mild systemic disease. After reviewing the risks                            and benefits, the patient was deemed in                            satisfactory condition to undergo the procedure.  After obtaining informed consent, the colonoscope                            was passed under direct vision. Throughout the                            procedure, the patient's blood pressure, pulse, and                            oxygen saturations were monitored continuously. The                            Model CF-HQ190L [id number]) scope was introduced                            through the anus and  advanced to the the cecum,                            identified by appendiceal orifice and ileocecal                            valve. The colonoscopy was performed without                            difficulty. The patient tolerated the procedure                            well. The quality of the bowel preparation was                            good. The ileocecal valve, appendiceal orifice, and                            rectum were photographed. The quality of the bowel                            preparation was evaluated using the BBPS [facility]                            Bowel Preparation Scale) with scores of: Right                            Colon = 2, Transverse Colon = 3 and Left Colon = 2.                            The total BBPS score equals 7. The bowel                            preparation used was SUPREP. Scope In: 8:13:22 AM Scope Out: 8:26:06 AM Scope Withdrawal Time: 0 hours 8 minutes 11 seconds  Total Procedure Duration: 0 hours 12 minutes 44 seconds  Findings:                 The perianal  and digital rectal examinations were                            normal.                           Multiple small and large-mouthed diverticula were                            found in the left colon.                           The exam was otherwise without abnormality on                            direct and retroflexion views. Complications:            No immediate complications. Estimated Blood Loss:     Estimated blood loss: none. Impression:               - Diverticulosis in the left colon.                           - The examination was otherwise normal on direct                            and retroflexion views.                           - No specimens collected. Recommendation:           - Patient has a contact number available for                            emergencies. The signs and symptoms of potential                            delayed complications were discussed with  the                            patient. Return to normal activities tomorrow.                            Written discharge instructions were provided to the                            patient.                           - Resume previous diet.                           - Continue present medications.                           - Repeat colonoscopy in 10 years for surveillance. Henry L. Loletha Carrow, MD 07/12/2016 8:34:38 AM This report has been signed electronically.

## 2016-07-12 NOTE — Patient Instructions (Signed)
Discharge instructions given. Handout on diverticulosis. Resume previous medications. YOU HAD AN ENDOSCOPIC PROCEDURE TODAY AT THE [facility]:   Refer to the procedure report that was given to you for any specific questions about what was found during the examination.  If the procedure report does not answer your questions, please call your gastroenterologist to clarify.  If you requested that your care partner not be given the details of your procedure findings, then the procedure report has been included in a sealed envelope for you to review at your convenience later.  YOU SHOULD EXPECT: Some feelings of bloating in the abdomen. Passage of more gas than usual.  Walking can help get rid of the air that was put into your GI tract during the procedure and reduce the bloating. If you had a lower endoscopy (such as a colonoscopy or flexible sigmoidoscopy) you may notice spotting of blood in your stool or on the toilet paper. If you underwent a bowel prep for your procedure, you may not have a normal bowel movement for a few days.  Please Note:  You might notice some irritation and congestion in your nose or some drainage.  This is from the oxygen used during your procedure.  There is no need for concern and it should clear up in a day or so.  SYMPTOMS TO REPORT IMMEDIATELY:   Following lower endoscopy (colonoscopy or flexible sigmoidoscopy):  Excessive amounts of blood in the stool  Significant tenderness or worsening of abdominal pains  Swelling of the abdomen that is new, acute  Fever of 100F or higher   For urgent or emergent issues, a gastroenterologist can be reached at any hour by calling [phone].   DIET:  We do recommend a small meal at first, but then you may proceed to your regular diet.  Drink plenty of fluids but you should avoid alcoholic beverages for 24 hours.  ACTIVITY:  You should plan to take it easy for the rest of today and you should NOT DRIVE or use  heavy machinery until tomorrow (because of the sedation medicines used during the test).    FOLLOW UP: Our staff will call the number listed on your records the next business day following your procedure to check on you and address any questions or concerns that you may have regarding the information given to you following your procedure. If we do not reach you, we will leave a message.  However, if you are feeling well and you are not experiencing any problems, there is no need to return our call.  We will assume that you have returned to your regular daily activities without incident.  If any biopsies were taken you will be contacted by phone or by letter within the next 1-3 weeks.  Please call us at [phone] if you have not heard about the biopsies in 3 weeks.    SIGNATURES/CONFIDENTIALITY: You and/or your care partner have signed paperwork which will be entered into your electronic medical record.  These signatures attest to the fact that that the information above on your After Visit Summary has been reviewed and is understood.  Full responsibility of the confidentiality of this discharge information lies with you and/or your care-partner. 

## 2016-07-15 ENCOUNTER — Telehealth: Payer: Self-pay | Admitting: *Deleted

## 2016-07-15 NOTE — Telephone Encounter (Signed)
  Follow up Call-  Call back number 07/12/2016  Post procedure Call Back phone  # [phone]  Permission to leave phone message Yes  Some recent data might be hidden     Patient questions:  Do you have a fever, pain , or abdominal swelling? No. Pain Score  0 *  Have you tolerated food without any problems? Yes.    Have you been able to return to your normal activities? Yes.    Do you have any questions about your discharge instructions: Diet   No. Medications  No. Follow up visit  No.  Do you have questions or concerns about your Care? No.  Actions: * If pain score is 4 or above: No action needed, pain <4.

## 2016-09-09 ENCOUNTER — Other Ambulatory Visit: Payer: Self-pay | Admitting: Physician Assistant

## 2016-09-09 DIAGNOSIS — G2581 Restless legs syndrome: Secondary | ICD-10-CM

## 2016-09-09 MED ORDER — ROPINIROLE HCL 1 MG PO TABS: 1.0000 mg | ORAL_TABLET | Freq: Three times a day (TID) | ORAL | 0 refills | Status: DC

## 2016-09-10 MED FILL — rOPINIRole HCL 1 MG TABS: 1 | 90 days supply | Qty: 270 | Fill #0

## 2016-10-10 ENCOUNTER — Telehealth: Payer: Self-pay | Admitting: Internal Medicine

## 2016-10-10 ENCOUNTER — Encounter: Payer: Self-pay | Admitting: Internal Medicine

## 2016-10-10 ENCOUNTER — Ambulatory Visit (INDEPENDENT_AMBULATORY_CARE_PROVIDER_SITE_OTHER): Payer: 59 | Admitting: Internal Medicine

## 2016-10-10 VITALS — BP 132/80 | HR 64 | Temp 97.9°F | Resp 16 | Ht 65.0 in | Wt 166.8 lb

## 2016-10-10 DIAGNOSIS — D508 Other iron deficiency anemias: Secondary | ICD-10-CM

## 2016-10-10 DIAGNOSIS — Z79899 Other long term (current) drug therapy: Secondary | ICD-10-CM

## 2016-10-10 DIAGNOSIS — I10 Essential (primary) hypertension: Secondary | ICD-10-CM

## 2016-10-10 DIAGNOSIS — N182 Chronic kidney disease, stage 2 (mild): Secondary | ICD-10-CM

## 2016-10-10 DIAGNOSIS — G5713 Meralgia paresthetica, bilateral lower limbs: Secondary | ICD-10-CM | POA: Diagnosis not present

## 2016-10-10 DIAGNOSIS — E1122 Type 2 diabetes mellitus with diabetic chronic kidney disease: Secondary | ICD-10-CM

## 2016-10-10 DIAGNOSIS — E782 Mixed hyperlipidemia: Secondary | ICD-10-CM | POA: Diagnosis not present

## 2016-10-10 DIAGNOSIS — E559 Vitamin D deficiency, unspecified: Secondary | ICD-10-CM

## 2016-10-10 LAB — CBC WITH DIFFERENTIAL/PLATELET
BASOS PCT: 0 %
Basophils Absolute: 0 cells/uL (ref 0–200)
EOS ABS: 236 {cells}/uL (ref 15–500)
EOS PCT: 4 %
HCT: 41.3 % (ref 35.0–45.0)
Hemoglobin: 13.6 g/dL (ref 11.7–15.5)
Lymphocytes Relative: 33 %
Lymphs Abs: 1947 cells/uL (ref 850–3900)
MCH: 28.8 pg (ref 27.0–33.0)
MCHC: 32.9 g/dL (ref 32.0–36.0)
MCV: 87.5 fL (ref 80.0–100.0)
MONOS PCT: 8 %
MPV: 9.3 fL (ref 7.5–12.5)
Monocytes Absolute: 472 cells/uL (ref 200–950)
NEUTROS ABS: 3245 {cells}/uL (ref 1500–7800)
Neutrophils Relative %: 55 %
PLATELETS: 180 10*3/uL (ref 140–400)
RBC: 4.72 MIL/uL (ref 3.80–5.10)
RDW: 13.8 % (ref 11.0–15.0)
WBC: 5.9 10*3/uL (ref 3.8–10.8)

## 2016-10-10 LAB — TSH: TSH: 1.62 m[IU]/L

## 2016-10-10 NOTE — Progress Notes (Signed)
[facility] Unk Pinto, M.D.        Uvaldo Bristle. Silverio Lay, P.A.-C       Starlyn Skeans, P.A.-C  [facility]                [address] [place], [place] [id number] Telephone [phone] Telefax [phone] ______________________________________________________________________     This very nice 65 y.o. WWF presents for 3 month follow up with Hypertension, Hyperlipidemia, Pre-Diabetes and Vitamin D Deficiency. Patient has been treated for RLS and intolerant to Gabapentin/Lyrica and is limited on Requip by sedation. Actually patient describes dysthesias in the distribution of the anterolateral femoral cutaneous n. which precipitate a need to move her legs.      Patient is treated for HTN & BP has been controlled at home. Today's BP is at goal - 132/80. Patient has had no complaints of any cardiac type chest pain, palpitations, dyspnea/orthopnea/PND, dizziness, claudication, or dependent edema.     Hyperlipidemia is controlled with diet & meds. Patient denies myalgias or other med SE's. Last Lipids were at goal: Lab Results  Component Value Date   CHOL 151 07/03/2016   HDL 42 (L) 07/03/2016   LDLCALC 87 07/03/2016   TRIG 112 07/03/2016   CHOLHDL 3.6 07/03/2016      Also, the patient has history of T2_NIDDM circa 2001 that she's been unsuccessfully attempting to control with diet/weight loss and has had no symptoms of reactive hypoglycemia, diabetic polys, paresthesias or visual blurring.     1d ago (10/10/16) 83mo ago (07/03/16) 58mo ago (04/05/16) 2mo ago (10/25/15) 70yr ago (04/06/15) 50yr ago (01/04/15) 32yr ago (07/06/14)   Hgb A1c  6.5  6.2CM  6.5CM  6.6CM   6.5CM   6.8CM   6.5CM     Last A1c was still not at goal:  Lab Results  Component Value Date   HGBA1C 6.2 (H) 07/03/2016      Further, the patient also has history of Vitamin D Deficiency and supplements vitamin D without any suspected  side-effects. Last vitamin D was at goal: Lab Results  Component Value Date   VD25OH 78 04/05/2016   Current Outpatient Prescriptions on File Prior to Visit  Medication Sig  . aspirin EC 81 MG tablet Take  daily.  Marland Kitchen CRANBERRY EXTRACT P Take 1 tab daily.    . cyclobenzaprine  10 MG  Take  at bedtime.   Marland Kitchen ibuprofen  600 MG tablet Take 600 mg by mouth every morning.  Marland Kitchen lisinopril ( 10 MG tablet TAKE 1/2 TO 1 TAB IN THE MORNING  . Multiple Vitamins-Minerals  Take  daily.  . Phenazopyridine  (AZO TABS) Take 2 tablets  daily.  Marland Kitchen rOPINIRole (REQUIP) 1 MG  Take 1 tab 3  times daily.  Marland Kitchen VITAMIN D Take 4,000 Units by mouth daily.    Allergies  Allergen Reactions  . Hctz [Hydrochlorothiazide]     Leg cramping  . Metformin And Related Nausea Only   PMHx:   Past Medical History:  Diagnosis Date  . Allergy    otc med prn  . Arthritis    hips, lower back  . Cataract    bilateral  . Chronic kidney disease    stage 3 per patient   . Diet-controlled type 2 diabetes mellitus (HCC)    Diet controlled, no meds  . Diverticulosis   . Dribbling urine   .  EKG abnormalities    pt states she had a BBB on her last EKG, no problems  . Essential hypertension, benign 08/19/2013  . HPV in female   . Hyperlipidemia    diet controlled, no med  . Restless leg syndrome   . Snoring   . Vitamin D deficiency    Immunization History  Administered Date(s) Administered  . Pneumococcal Conjugate-13 04/05/2016  . Tdap 03/07/2011   Past Surgical History:  Procedure Laterality Date  . BREAST BIOPSY  1969   left lumpectomy - benign  . COLONOSCOPY  2012   kaplan-polyps, tics  . LUMBAR LAMINECTOMY/DECOMPRESSION MICRODISCECTOMY  05/07/2012   Procedure: LUMBAR LAMINECTOMY/DECOMPRESSION MICRODISCECTOMY;  Surgeon: Sinclair Ship, MD;  Location: [facility];  Service: Orthopedics;  Laterality: Bilateral;  Lumbar 4-5 decompression  . OVARIAN CYST REMOVAL  1981   right  . TUBAL LIGATION  1981  . VAGINAL  HYSTERECTOMY  1992   FHx:    Reviewed / unchanged  SHx:    Reviewed / unchanged  Systems Review:  Constitutional: Denies fever, chills, wt changes, headaches, insomnia, fatigue, night sweats, change in appetite. Eyes: Denies redness, blurred vision, diplopia, discharge, itchy, watery eyes.  ENT: Denies discharge, congestion, post nasal drip, epistaxis, sore throat, earache, hearing loss, dental pain, tinnitus, vertigo, sinus pain, snoring.  CV: Denies chest pain, palpitations, irregular heartbeat, syncope, dyspnea, diaphoresis, orthopnea, PND, claudication or edema. Respiratory: denies cough, dyspnea, DOE, pleurisy, hoarseness, laryngitis, wheezing.  Gastrointestinal: Denies dysphagia, odynophagia, heartburn, reflux, water brash, abdominal pain or cramps, nausea, vomiting, bloating, diarrhea, constipation, hematemesis, melena, hematochezia  or hemorrhoids. Genitourinary: Denies dysuria, frequency, urgency, nocturia, hesitancy, discharge, hematuria or flank pain. Musculoskeletal: Denies arthralgias, myalgias, stiffness, jt. swelling, pain, limping or strain/sprain.  Skin: Denies pruritus, rash, hives, warts, acne, eczema or change in skin lesion(s). Neuro: No weakness, tremor, incoordination, spasms, paresthesia or pain. Psychiatric: Denies confusion, memory loss or sensory loss. Endo: Denies change in weight, skin or hair change.  Heme/Lymph: No excessive bleeding, bruising or enlarged lymph nodes.  Physical Exam  BP 132/80   Pulse 64   Temp 97.9 F (36.6 C)   Resp 16   Ht 5\' 5"  (1.651 m)   Wt 166 lb 12.8 oz (75.7 kg)   BMI 27.76 kg/m   Appears well nourished and in no distress.  Eyes: PERRLA, EOMs, conjunctiva no swelling or erythema. Sinuses: No frontal/maxillary tenderness ENT/Mouth: EAC's clear, TM's nl w/o erythema, bulging. Nares clear w/o erythema, swelling, exudates. Oropharynx clear without erythema or exudates. Oral hygiene is good. Tongue normal, non obstructing.  Hearing intact.  Neck: Supple. Thyroid nl. Car 2+/2+ without bruits, nodes or JVD. Chest: Respirations nl with BS clear & equal w/o rales, rhonchi, wheezing or stridor.  Cor: Heart sounds normal w/ regular rate and rhythm without sig. murmurs, gallops, clicks, or rubs. Peripheral pulses normal and equal  without edema.  Abdomen: Soft & bowel sounds normal. Non-tender w/o guarding, rebound, hernias, masses, or organomegaly.  Lymphatics: Unremarkable.  Musculoskeletal: Full ROM all peripheral extremities, joint stability, 5/5 strength, and normal gait.  Skin: Warm, dry without exposed rashes, lesions or ecchymosis apparent.  Neuro: Cranial nerves intact, reflexes equal bilaterally. Sensory-motor testing grossly intact. Tendon reflexes grossly intact.  Pysch: Alert & oriented x 3.  Insight and judgement nl & appropriate. No ideations.  Assessment and Plan:  1. Essential hypertension, benign  - Continue medication, monitor blood pressure at home.  - Continue DASH diet. Reminder to go to the ER if any CP,  SOB, nausea, dizziness, severe HA, changes vision/speech,  left arm numbness and tingling and jaw pain.  - CBC with Differential/Platelet - BASIC METABOLIC PANEL WITH GFR - TSH  2. Mixed hyperlipidemia  - Continue diet/meds, exercise,& lifestyle modifications.  - Continue monitor periodic cholesterol/liver & renal functions   - Hepatic function panel - Lipid panel - TSH  3. T2_NIDDM w/CKD2  ([facility])  - Continue diet, exercise, lifestyle modifications.  - Monitor appropriate labs. - Hemoglobin A1c - Insulin, random  4. Vitamin D deficiency  - Continue supplementation. - VITAMIN D 25 Hydroxy   5. Meralgia paresthetica of both lower extremities  - Ambulatory referral to Neurology  6. Other iron deficiency anemia  - Iron and TIBC - Ferritin  7. Medication management  - CBC with Differential/Platelet - BASIC METABOLIC PANEL WITH GFR - Hepatic function panel -  Magnesium - Lipid panel       Recommended regular exercise, BP monitoring, weight control, and discussed med and SE's. Recommended labs to assess and monitor clinical status. Further disposition pending results of labs. Over 30 minutes of exam, counseling, chart review was performed

## 2016-10-10 NOTE — Patient Instructions (Signed)

## 2016-10-11 ENCOUNTER — Encounter: Payer: Self-pay | Admitting: Internal Medicine

## 2016-10-11 ENCOUNTER — Other Ambulatory Visit: Payer: Self-pay | Admitting: Internal Medicine

## 2016-10-11 LAB — MAGNESIUM: MAGNESIUM: 2 mg/dL (ref 1.5–2.5)

## 2016-10-11 LAB — HEPATIC FUNCTION PANEL
ALK PHOS: 61 U/L (ref 33–130)
ALT: 15 U/L (ref 6–29)
AST: 17 U/L (ref 10–35)
Albumin: 4.1 g/dL (ref 3.6–5.1)
BILIRUBIN DIRECT: 0.1 mg/dL (ref <=0.2)
BILIRUBIN TOTAL: 0.3 mg/dL (ref 0.2–1.2)
Indirect Bilirubin: 0.2 mg/dL (ref 0.2–1.2)
Total Protein: 7 g/dL (ref 6.1–8.1)

## 2016-10-11 LAB — LIPID PANEL
CHOL/HDL RATIO: 4.8 ratio (ref <5.0)
CHOLESTEROL: 168 mg/dL (ref <200)
HDL: 35 mg/dL — AB (ref >50)
LDL Cholesterol: 99 mg/dL (ref <100)
TRIGLYCERIDES: 171 mg/dL — AB (ref <150)
VLDL: 34 mg/dL — ABNORMAL HIGH (ref <30)

## 2016-10-11 LAB — BASIC METABOLIC PANEL WITH GFR
BUN: 20 mg/dL (ref 7–25)
CHLORIDE: 104 mmol/L (ref 98–110)
CO2: 22 mmol/L (ref 20–31)
Calcium: 9.1 mg/dL (ref 8.6–10.4)
Creat: 0.97 mg/dL (ref 0.50–0.99)
GFR, EST NON AFRICAN AMERICAN: 61 mL/min (ref >=60)
GFR, Est African American: 71 mL/min (ref >=60)
Glucose, Bld: 138 mg/dL — ABNORMAL HIGH (ref 65–99)
POTASSIUM: 4 mmol/L (ref 3.5–5.3)
SODIUM: 140 mmol/L (ref 135–146)

## 2016-10-11 LAB — HEMOGLOBIN A1C
Hgb A1c MFr Bld: 6.5 % — ABNORMAL HIGH (ref <5.7)
Mean Plasma Glucose: 140 mg/dL

## 2016-10-11 LAB — IRON AND TIBC
%SAT: 20 % (ref 11–50)
IRON: 59 ug/dL (ref 45–160)
TIBC: 302 ug/dL (ref 250–450)
UIBC: 243 ug/dL (ref 125–400)

## 2016-10-11 LAB — FERRITIN: Ferritin: 100 ng/mL (ref 20–288)

## 2016-10-11 LAB — INSULIN, RANDOM: INSULIN: 21.9 u[IU]/mL — AB (ref 2.0–19.6)

## 2016-10-11 LAB — VITAMIN D 25 HYDROXY (VIT D DEFICIENCY, FRACTURES): VIT D 25 HYDROXY: 72 ng/mL (ref 30–100)

## 2016-10-15 NOTE — Telephone Encounter (Signed)
ERROR

## 2016-10-29 ENCOUNTER — Encounter: Payer: Self-pay | Admitting: Physician Assistant

## 2016-12-16 ENCOUNTER — Other Ambulatory Visit: Payer: Self-pay | Admitting: Physician Assistant

## 2016-12-16 DIAGNOSIS — G2581 Restless legs syndrome: Secondary | ICD-10-CM

## 2016-12-16 MED ORDER — ROPINIROLE HCL 1 MG PO TABS: 1.0000 mg | ORAL_TABLET | Freq: Three times a day (TID) | ORAL | 0 refills | Status: DC

## 2016-12-16 MED FILL — rOPINIRole HCL 1 MG TABS: 1 | 90 days supply | Qty: 270 | Fill #0

## 2016-12-18 ENCOUNTER — Encounter: Payer: Self-pay | Admitting: Neurology

## 2016-12-18 ENCOUNTER — Ambulatory Visit (INDEPENDENT_AMBULATORY_CARE_PROVIDER_SITE_OTHER): Payer: 59 | Admitting: Neurology

## 2016-12-18 VITALS — BP 120/70 | HR 80 | Ht 65.0 in | Wt 170.2 lb

## 2016-12-18 DIAGNOSIS — G2581 Restless legs syndrome: Secondary | ICD-10-CM | POA: Diagnosis not present

## 2016-12-18 DIAGNOSIS — G5761 Lesion of plantar nerve, right lower limb: Secondary | ICD-10-CM

## 2016-12-18 MED ORDER — ROPINIROLE HCL 0.25 MG PO TABS: ORAL_TABLET | ORAL | 5 refills | Status: DC

## 2016-12-18 MED FILL — rOPINIRole HCL 0.25 MG TABS: 0.25 | 23 days supply | Qty: 45 | Fill #0

## 2016-12-18 NOTE — Progress Notes (Signed)
[facility] Clinic Note - Initial Visit   Date: 12/18/16  [name] MRN: [id number] DOB: July 20, 1951   Dear Dr. Melford Aase:  Thank you for your kind referral of [name] for consultation of bilateral leg pain. Although her history is well known to you, please allow [name] to reiterate it for the purpose of our medical record. The patient was accompanied to the clinic by self.    History of Present Illness: Rachel Daniel is a 66 y.o. right-handed Caucasian female with diabetes mellitus (HbA1c 6.5), hypertension, RLS presenting for evaluation of bilateral leg pain.    Starting around her 39s, she recalls having achy pain of the right thigh, which would prohibit her from falling asleep.  Her husband often complained that she would kick him all night long.  She has squeezing, stabbing, knife-like pain of bilateral thighs. She does not have numbness or tingling.  Pain is mostly over the anterior and lateral portion of the thigh, but can involve the left medial thigh.  The lower legs and feet are spared.  She only has relief with walking and stretching.  Pain was severe at night time, but over the past several years started having daytime symptoms and she cannot get relief with anything.  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 Her pain is better on the weekends when she is less sedentary.  She was diagnosed with RLS in her 47s and started on ropinorole and takes 3mg  at bedtime which provides relief until lunch time.  She is unable to take ropinorole during the day because of sedation.  She is intolerant to Lyrica.  She denies any weakness.    She also complains of numbness of the right great toe since her 91s.  This has been constant and there is no tingling or pain.  She denies weakness.  Symptoms have not progressed and do not bother her.  She does not have similar symptoms on the left foot.  Out-side paper records, electronic medical  record, and images have been reviewed where available and summarized as:  Lab Results  Component Value Date   TSH 1.62 10/10/2016   Lab Results  Component Value Date   HGBA1C 6.5 (H) 10/10/2016   Iron/TIBC/Ferritin/ %Sat    Component Value Date/Time   IRON 59 10/10/2016 1525   TIBC 302 10/10/2016 1525   FERRITIN 100 10/10/2016 1525   IRONPCTSAT 20 10/10/2016 1525     Past Medical History:  Diagnosis Date  . Allergy    otc med prn  . Arthritis    hips, lower back  . Cataract    bilateral  . Chronic kidney disease    stage 3 per patient   . Diet-controlled type 2 diabetes mellitus (HCC)    Diet controlled, no meds  . Diverticulosis   . Dribbling urine   . EKG abnormalities    pt states she had a BBB on her last EKG, no problems  . Essential hypertension, benign 08/19/2013  . HPV in female   . Hyperlipidemia    diet controlled, no med  . Restless leg syndrome   . Snoring   . Vitamin D deficiency     Past Surgical History:  Procedure Laterality Date  . BREAST BIOPSY  1969   left lumpectomy - benign  . COLONOSCOPY  2012   kaplan-polyps, tics  . LUMBAR LAMINECTOMY/DECOMPRESSION MICRODISCECTOMY  05/07/2012   Procedure: LUMBAR LAMINECTOMY/DECOMPRESSION MICRODISCECTOMY;  Surgeon: Elta Guadeloupe  Suzan Slick, MD;  Location: [facility];  Service: Orthopedics;  Laterality: Bilateral;  Lumbar 4-5 decompression  . OVARIAN CYST REMOVAL  1981   right  . TUBAL LIGATION  1981  . VAGINAL HYSTERECTOMY  1992     Medications:  Outpatient Encounter Prescriptions as of 12/18/2016  Medication Sig Note  . aspirin EC 81 MG tablet Take 81 mg by mouth daily.   Marland Kitchen CRANBERRY EXTRACT PO Take 1 tablet by mouth daily.     . cyclobenzaprine (FLEXERIL) 10 MG tablet Take 10 mg by mouth at bedtime.    Marland Kitchen ibuprofen (ADVIL,MOTRIN) 600 MG tablet Take 600 mg by mouth every morning.   Marland Kitchen lisinopril (PRINIVIL,ZESTRIL) 10 MG tablet TAKE 1/2 TO 1 TABLET BY MOUTH IN THE MORNING FOR HYPERTENSION/KIDNEY PROTECTION  FROM DIABETES 10/10/2016: Takes 1/2 tablet daily.  . Multiple Vitamins-Minerals (MULTIVITAMIN PO) Take by mouth daily.   . Phenazopyridine HCl (AZO TABS PO) Take 2 tablets by mouth daily.   Marland Kitchen rOPINIRole (REQUIP) 1 MG tablet Take 1 tablet (1 mg total) by mouth 3 (three) times daily.   Marland Kitchen VITAMIN D, CHOLECALCIFEROL, PO Take 4,000 Units by mouth daily.     Facility-Administered Encounter Medications as of 12/18/2016  Medication  . 0.9 %  sodium chloride infusion     Allergies:  Allergies  Allergen Reactions  . Hctz [Hydrochlorothiazide]     Leg cramping  . Metformin And Related Nausea Only    Family History: Family History  Problem Relation Age of Onset  . Hypertension Mother   . Diabetes Mother   . Stroke Mother   . Heart failure Mother   . Colon cancer Mother 42  . Heart failure Father 70  . Stroke Sister 29  . Colon polyps Sister   . Colon polyps Brother   . Stomach cancer Neg Hx   . Esophageal cancer Neg Hx   . Rectal cancer Neg Hx     Social History: Social History  Substance Use Topics  . Smoking status: Passive Smoke Exposure - Never Smoker  . Smokeless tobacco: Never Used  . Alcohol use No   Social History   Social History Narrative  . No narrative on file    Review of Systems:  CONSTITUTIONAL: No fevers, chills, night sweats, or weight loss.   EYES: No visual changes or eye pain ENT: No hearing changes.  No history of nose bleeds.   RESPIRATORY: No cough, wheezing and shortness of breath.   CARDIOVASCULAR: Negative for chest pain, and palpitations.   GI: Negative for abdominal discomfort, blood in stools or black stools.  No recent change in bowel habits.   GU:  No history of incontinence.   MUSCLOSKELETAL: No history of joint pain or swelling.  No myalgias.   SKIN: Negative for lesions, rash, and itching.   HEMATOLOGY/ONCOLOGY: Negative for prolonged bleeding, bruising easily, and swollen nodes.  No history of cancer.   ENDOCRINE: Negative for cold or  heat intolerance, polydipsia or goiter.   PSYCH:  No depression or anxiety symptoms.   NEURO: As Above.   Vital Signs:  BP 120/70   Pulse 80   Ht 5\' 5"  (1.651 m)   Wt 170 lb 4 oz (77.2 kg)   SpO2 98%   BMI 28.33 kg/m    General Medical Exam:   General:  Well appearing, comfortable.   Eyes/ENT: see cranial nerve examination.   Neck: No masses appreciated.  Full range of motion without tenderness.  No carotid bruits. Respiratory:  Clear to auscultation, good air entry bilaterally.   Cardiac:  Regular rate and rhythm, no murmur.   Extremities:  No deformities, edema, or skin discoloration.  Skin:  No rashes or lesions.  Neurological Exam: MENTAL STATUS including orientation to time, place, person, recent and remote memory, attention span and concentration, language, and fund of knowledge is normal.  Speech is not dysarthric.  CRANIAL NERVES: II:  No visual field defects.  Unremarkable fundi.   III-IV-VI: Pupils equal round and reactive to light.  Normal conjugate, extra-ocular eye movements in all directions of gaze. Left esotropia No nystagmus.  No ptosis.   V:  Normal facial sensation.    VII:  Normal facial symmetry and movements.   VIII:  Normal hearing and vestibular function.   IX-X:  Normal palatal movement.   XI:  Normal shoulder shrug and head rotation.   XII:  Normal tongue strength and range of motion, no deviation or fasciculation.  MOTOR:  No atrophy, fasciculations or abnormal movements.  No pronator drift.  Tone is normal.    Right Upper Extremity:    Left Upper Extremity:    Deltoid  5/5   Deltoid  5/5   Biceps  5/5   Biceps  5/5   Triceps  5/5   Triceps  5/5   Wrist extensors  5/5   Wrist extensors  5/5   Wrist flexors  5/5   Wrist flexors  5/5   Finger extensors  5/5   Finger extensors  5/5   Finger flexors  5/5   Finger flexors  5/5   Dorsal interossei  5/5   Dorsal interossei  5/5   Abductor pollicis  5/5   Abductor pollicis  5/5   Tone (Ashworth  scale)  0  Tone (Ashworth scale)  0   Right Lower Extremity:    Left Lower Extremity:    Hip flexors  5/5   Hip flexors  5/5   Hip extensors  5/5   Hip extensors  5/5   Knee flexors  5/5   Knee flexors  5/5   Knee extensors  5/5   Knee extensors  5/5   Dorsiflexors  5/5   Dorsiflexors  5/5   Plantarflexors  5/5   Plantarflexors  5/5   Toe extensors  5/5   Toe extensors  5/5   Toe flexors  5/5   Toe flexors  5/5   Tone (Ashworth scale)  0  Tone (Ashworth scale)  0   MSRs:  Right                                                                 Left brachioradialis 2+  brachioradialis 2+  biceps 2+  biceps 2+  triceps 2+  triceps 2+  patellar 2+  patellar 2+  ankle jerk 2+  ankle jerk 2+  Hoffman no  Hoffman no  plantar response down  plantar response down   SENSORY:  Reduced vibration and temperature over the right great toe, otherwise normal and symmetric perception of light touch, pinprick, vibration, and proprioception.  Romberg's sign absent.  Sensation over the LFCN bilaterally is intact.   COORDINATION/GAIT: Normal finger-to- nose-finger and heel-to-shin.  Intact rapid alternating movements bilaterally.  Able to rise from a chair without  using arms.  Gait narrow based and stable. She is unsteady with stressed and tandem gait.   IMPRESSION: 1.  Restless leg syndrome affecting bilateral thigh and responsive to dopamine agonist.  Her exam does not have disclose sensory changes over the lateral femoral cutaneous nerve distribution and she has pain involving her medial thigh which makes meralgia paresthetica unlikely. Further, this would not respond to a dopamine agonist.  Overall, she is doing well on ropinirole 3mg  which controls her nighttime symptoms, but over the past few years, daytime symptoms have become very intense.  This can be due to worsening RLS or effects of augmentation with dopamine agonist since she has been on this for many years.  Because she continues to have relief  with ropinirole, I will start her on a very low dose of this during the day to see if we can provide daytime relief.  She has not been able to tolerate 0.5mg  in the past due to day time sedation.  Start ropinirole 0.25mg  at 11am for one week, then titrate to 0.5mg , if she tolerates.  She will be continued on her nighttime dose of ropinirole 3mg .  Alternative options include gabapentin, sinemet, and pramipexole.  2.  Right great toe numbness, likely due to Morton's neuroma.  She does not have features suggestive of diabetic neuropathy.  I offered to perform NCS/EMG of the legs, if her symptoms were bothersome, but she does not wish to proceed as her symptoms have been longstanding.    Call with update in 1 month  Return to clinic in 3 months  The duration of this appointment visit was 40 minutes of face-to-face time with the patient.  Greater than 50% of this time was spent in counseling, explanation of diagnosis, planning of further management, and coordination of care.   Thank you for allowing me to participate in patient's care.  If I can answer any additional questions, I would be pleased to do so.    Sincerely,    Donika K. Posey Pronto, DO

## 2016-12-18 NOTE — Patient Instructions (Addendum)
1.  Take ropinirole 0.25mg  at 11am for one week, if you are not too sleepy, then increase to 2 tablets at 11am.    2.  Continue ropinirole 3mg  at bedtime  3.  Call with update in 1 month  Return to clinic 3 months

## 2017-01-08 ENCOUNTER — Ambulatory Visit (INDEPENDENT_AMBULATORY_CARE_PROVIDER_SITE_OTHER): Payer: 59 | Admitting: Physician Assistant

## 2017-01-08 ENCOUNTER — Encounter: Payer: Self-pay | Admitting: Physician Assistant

## 2017-01-08 VITALS — BP 120/82 | HR 76 | Temp 97.7°F | Resp 14 | Ht 65.0 in | Wt 174.0 lb

## 2017-01-08 DIAGNOSIS — G2581 Restless legs syndrome: Secondary | ICD-10-CM

## 2017-01-08 DIAGNOSIS — N182 Chronic kidney disease, stage 2 (mild): Secondary | ICD-10-CM

## 2017-01-08 DIAGNOSIS — E1365 Other specified diabetes mellitus with hyperglycemia: Secondary | ICD-10-CM

## 2017-01-08 DIAGNOSIS — I10 Essential (primary) hypertension: Secondary | ICD-10-CM | POA: Diagnosis not present

## 2017-01-08 DIAGNOSIS — E1322 Other specified diabetes mellitus with diabetic chronic kidney disease: Secondary | ICD-10-CM

## 2017-01-08 DIAGNOSIS — E1142 Type 2 diabetes mellitus with diabetic polyneuropathy: Secondary | ICD-10-CM | POA: Diagnosis not present

## 2017-01-08 DIAGNOSIS — E782 Mixed hyperlipidemia: Secondary | ICD-10-CM | POA: Diagnosis not present

## 2017-01-08 DIAGNOSIS — IMO0002 Reserved for concepts with insufficient information to code with codable children: Secondary | ICD-10-CM

## 2017-01-08 LAB — BASIC METABOLIC PANEL WITH GFR
BUN: 25 mg/dL (ref 7–25)
CHLORIDE: 104 mmol/L (ref 98–110)
CO2: 24 mmol/L (ref 20–31)
CREATININE: 1.13 mg/dL — AB (ref 0.50–0.99)
Calcium: 9.6 mg/dL (ref 8.6–10.4)
GFR, EST NON AFRICAN AMERICAN: 51 mL/min — AB (ref >=60)
GFR, Est African American: 59 mL/min — ABNORMAL LOW (ref >=60)
Glucose, Bld: 124 mg/dL — ABNORMAL HIGH (ref 65–99)
Potassium: 4.2 mmol/L (ref 3.5–5.3)
SODIUM: 139 mmol/L (ref 135–146)

## 2017-01-08 LAB — HEPATIC FUNCTION PANEL
ALT: 18 U/L (ref 6–29)
AST: 17 U/L (ref 10–35)
Albumin: 4.2 g/dL (ref 3.6–5.1)
Alkaline Phosphatase: 66 U/L (ref 33–130)
BILIRUBIN DIRECT: 0.1 mg/dL (ref <=0.2)
BILIRUBIN INDIRECT: 0.2 mg/dL (ref 0.2–1.2)
BILIRUBIN TOTAL: 0.3 mg/dL (ref 0.2–1.2)
Total Protein: 7.6 g/dL (ref 6.1–8.1)

## 2017-01-08 LAB — CBC WITH DIFFERENTIAL/PLATELET
BASOS PCT: 0 %
Basophils Absolute: 0 cells/uL (ref 0–200)
EOS PCT: 3 %
Eosinophils Absolute: 186 cells/uL (ref 15–500)
HCT: 42.2 % (ref 35.0–45.0)
HEMOGLOBIN: 14.2 g/dL (ref 11.7–15.5)
LYMPHS ABS: 2542 {cells}/uL (ref 850–3900)
Lymphocytes Relative: 41 %
MCH: 29.6 pg (ref 27.0–33.0)
MCHC: 33.6 g/dL (ref 32.0–36.0)
MCV: 87.9 fL (ref 80.0–100.0)
MPV: 9 fL (ref 7.5–12.5)
Monocytes Absolute: 496 cells/uL (ref 200–950)
Monocytes Relative: 8 %
NEUTROS ABS: 2976 {cells}/uL (ref 1500–7800)
Neutrophils Relative %: 48 %
PLATELETS: 183 10*3/uL (ref 140–400)
RBC: 4.8 MIL/uL (ref 3.80–5.10)
RDW: 13.7 % (ref 11.0–15.0)
WBC: 6.2 10*3/uL (ref 3.8–10.8)

## 2017-01-08 LAB — LIPID PANEL
CHOL/HDL RATIO: 5 ratio — AB (ref <5.0)
CHOLESTEROL: 174 mg/dL (ref <200)
HDL: 35 mg/dL — AB (ref >50)
LDL Cholesterol: 107 mg/dL — ABNORMAL HIGH (ref <100)
Triglycerides: 160 mg/dL — ABNORMAL HIGH (ref <150)
VLDL: 32 mg/dL — AB (ref <30)

## 2017-01-08 NOTE — Progress Notes (Signed)
Subjective:    Patient ID: Rachel Daniel, female    DOB: 03-05-1951, 66 y.o.   MRN: [id number]  HPI 66 y.o. presents for 3 month follow up.  Will be retiring in a month.   Her blood pressure has been controlled at home, today their BP is BP: 120/82  She does not workout. She denies chest pain, shortness of breath, dizziness.  She is not on cholesterol medication and denies myalgias. Her cholesterol is at goal. The cholesterol last visit was:   Lab Results  Component Value Date   CHOL 168 10/10/2016   HDL 35 (L) 10/10/2016   LDLCALC 99 10/10/2016   TRIG 171 (H) 10/10/2016   CHOLHDL 4.8 10/10/2016   She has been working on diet and exercise for Diabetes with diabetic chronic kidney disease, she is on bASA, she is on ACE/ARB, and denies paresthesia of the feet, polydipsia, polyuria and visual disturbances. Last A1C was:  Lab Results  Component Value Date   HGBA1C 6.5 (H) 10/10/2016   Lab Results  Component Value Date   GFRNONAA 61 10/10/2016   Patient is on Vitamin D supplement.   Lab Results  Component Value Date   VD25OH 72 10/10/2016     BMI is Body mass index is 28.96 kg/m., she is working on diet and exercise. Wt Readings from Last 3 Encounters:  01/08/17 174 lb (78.9 kg)  12/18/16 170 lb 4 oz (77.2 kg)  10/10/16 166 lb 12.8 oz (75.7 kg)   Medications Current Outpatient Prescriptions on File Prior to Visit  Medication Sig  . aspirin EC 81 MG tablet Take 81 mg by mouth daily.  Marland Kitchen CRANBERRY EXTRACT PO Take 1 tablet by mouth daily.    . cyclobenzaprine (FLEXERIL) 10 MG tablet Take 10 mg by mouth at bedtime.   Marland Kitchen ibuprofen (ADVIL,MOTRIN) 600 MG tablet Take 600 mg by mouth every morning.  Marland Kitchen lisinopril (PRINIVIL,ZESTRIL) 10 MG tablet TAKE 1/2 TO 1 TABLET BY MOUTH IN THE MORNING FOR HYPERTENSION/KIDNEY PROTECTION FROM DIABETES  . Multiple Vitamins-Minerals (MULTIVITAMIN PO) Take by mouth daily.  . Phenazopyridine HCl (AZO TABS PO) Take 2 tablets by mouth daily.  Marland Kitchen  rOPINIRole (REQUIP) 0.25 MG tablet Take 1 tablet at 11am x 1 week, then increase to 2 tablets.  Marland Kitchen rOPINIRole (REQUIP) 1 MG tablet Take 1 tablet (1 mg total) by mouth 3 (three) times daily.  Marland Kitchen VITAMIN D, CHOLECALCIFEROL, PO Take 4,000 Units by mouth daily.    Current Facility-Administered Medications on File Prior to Visit  Medication  . 0.9 %  sodium chloride infusion    Problem list She has Diverticulosis of large intestine; Restless leg syndrome; Uncontrolled secondary diabetes mellitus with stage 2 CKD (GFR 60-89) ([place]); Hyperlipidemia; Vitamin D deficiency; Essential hypertension, benign; GERD (gastroesophageal reflux disease); Diabetic neuropathy ([place]); and Meralgia paresthetica of both lower extremities on her problem list.  Review of Systems  Constitutional: Negative.   HENT: Negative.   Respiratory: Negative.   Cardiovascular: Negative.   Gastrointestinal: Negative.   Genitourinary: Negative.   Musculoskeletal: Negative.   Skin: Negative.   Neurological: Negative.   Hematological: Negative.   Psychiatric/Behavioral: Negative.        Objective:   Physical Exam  Constitutional: She is oriented to person, place, and time. She appears well-developed and well-nourished.  HENT:  Head: Normocephalic and atraumatic.  Right Ear: External ear normal.  Left Ear: External ear normal.  Mouth/Throat: Oropharynx is clear and moist.  Eyes: Conjunctivae and EOM are normal.  Pupils are equal, round, and reactive to light.  Neck: Normal range of motion. Neck supple. No thyromegaly present.  Cardiovascular: Normal rate, regular rhythm and normal heart sounds.  Exam reveals no gallop and no friction rub.   No murmur heard. Pulmonary/Chest: Effort normal and breath sounds normal. No respiratory distress. She has no wheezes.  Abdominal: Soft. Bowel sounds are normal. She exhibits no distension and no mass. There is no tenderness. There is no rebound and no guarding.  Musculoskeletal: Normal  range of motion.  Lymphadenopathy:    She has no cervical adenopathy.  Neurological: She is alert and oriented to person, place, and time. She displays normal reflexes. No cranial nerve deficit. Coordination normal.  Skin: Skin is warm and dry.  Psychiatric: She has a normal mood and affect.       Assessment & Plan:   Essential hypertension, benign - continue medications, DASH diet, exercise and monitor at home. Call if greater than 130/80.  -     CBC with Differential/Platelet -     BASIC METABOLIC PANEL WITH GFR -     Hepatic function panel -     TSH  Uncontrolled secondary diabetes mellitus with stage 2 CKD (GFR 60-89) (HCC) Discussed general issues about diabetes pathophysiology and management., Educational material distributed., Suggested low cholesterol diet., Encouraged aerobic exercise., Discussed foot care., Reminded to get yearly retinal exam. -     Hemoglobin A1c  Diabetic polyneuropathy associated with type 2 diabetes mellitus ([place]) Discussed general issues about diabetes pathophysiology and management., Educational material distributed., Suggested low cholesterol diet., Encouraged aerobic exercise., Discussed foot care., Reminded to get yearly retinal exam. -     Hemoglobin A1c  Mixed hyperlipidemia -continue medications, check lipids, decrease fatty foods, increase activity.  -     Lipid panel  Restless leg syndrome requip PRN  Will retire in 1 month  Future Appointments Date Time Provider [place]  03/21/2017 8:00 AM Alda Berthold, DO [facility] None  04/09/2017 9:00 AM Vicie Mutters, PA-C [id number] None

## 2017-01-08 NOTE — Patient Instructions (Signed)
10 Tips on Belching, Bloating, and Flatulence 1. Belching is caused by swallowed air from:  Eating or drinking too fast  Poorly fitting dentures; not chewing food completely  Carbonated beverages  Chewing gum or sucking on hard candies  Excessive swallowing due to nervous tension or postnasal drip  Forced belching to relieve abdominal discomfort 2. To prevent excessive belching, avoid:  Carbonated beverages  Chewing gum  Hard candies  Simethicone/GasX may be helpful  3. Abdominal bloating and discomfort may be due to intestinal sensitivity or symptoms of irritable bowel syndrome. To relieve symptoms, avoid:  Broccoli  Baked beans  Cabbage  Carbonated drinks  Cauliflower  Chewing gum  Hard candy 4. Abdominal distention resulting from weak abdominal muscles:  Is better in the morning  Gets worse as the day progresses  Is relieved by lying down 5. To prevent Abdominal distention:  Tighten abdominal muscles by pulling in your stomach several times during the day  Do sit-up exercises if possible  Wear an abdominal support garment if exercise is too difficult 6. Flatulence is gas created through bacterial action in the bowel and passed rectally. Keep in mind that:  10-18 passages per day are normal  Primary gases are harmless and odorless  Noticeable smells are trace gases related to food intake 7. Foods that are likely to form gas include:  Milk, dairy products, and medications that contain lactose--If your body doesn't produce the enzyme (lactase) to break it down.  Certain vegetables--baked beans, cauliflower, broccoli, cabbage  Certain starches--wheat, oats, corn, potatoes. Rice is a good substitute. 8. Identify offending foods. Reduce or eliminate these gas-forming foods from your diet.   Restless Legs Syndrome Restless legs syndrome is a condition that causes uncomfortable feelings or sensations in the legs, especially while sitting or lying down. The sensations usually  cause an overwhelming urge to move the legs. The arms can also sometimes be affected. The condition can range from mild to severe. The symptoms often interfere with a person's ability to sleep. What are the causes? The cause of this condition is not known. What increases the risk? This condition is more likely to develop in:  People who are older than age 39.  Pregnant women. In general, restless legs syndrome is more common in women than in men.  People who have a family history of the condition.  People who have certain medical conditions, such as iron deficiency, kidney disease, Parkinson disease, or nerve damage.  People who take certain medicines, such as medicines for high blood pressure, nausea, colds, allergies, depression, and some heart conditions. What are the signs or symptoms? The main symptom of this condition is uncomfortable sensations in the legs. These sensations may be:  Described as pulling, tingling, prickling, throbbing, crawling, or burning.  Worse while you are sitting or lying down.  Worse during periods of rest or inactivity.  Worse at night, often interfering with your sleep.  Accompanied by a very strong urge to move your legs.  Temporarily relieved by movement of your legs. The sensations usually affect both sides of the body. The arms can also be affected, but this is rare. People who have this condition often have tiredness during the day because of their lack of sleep at night. How is this diagnosed? This condition may be diagnosed based on your description of the symptoms. You may also have tests, including blood tests, to check for other conditions that may lead to your symptoms. In some cases, you may be asked to  spend some time in a sleep lab so your sleeping can be monitored. How is this treated? Treatment for this condition is focused on managing the symptoms. Treatment may include:  Self-help and lifestyle changes.  Medicines. Follow these  instructions at home:  Take medicines only as directed by your health care provider.  Try these methods to get temporary relief from the uncomfortable sensations:  Massage your legs.  Walk or stretch.  Take a cold or hot bath.  Practice good sleep habits. For example, go to bed and get up at the same time every day.  Exercise regularly.  Practice ways of relaxing, such as yoga or meditation.  Avoid caffeine and alcohol.  Do not use any tobacco products, including cigarettes, chewing tobacco, or electronic cigarettes. If you need help quitting, ask your health care provider.  Keep all follow-up visits as directed by your health care provider. This is important. Contact a health care provider if: Your symptoms do not improve with treatment, or they get worse. This information is not intended to replace advice given to you by your health care provider. Make sure you discuss any questions you have with your health care provider. Document Released: 09/20/2002 Document Revised: 03/07/2016 Document Reviewed: 09/26/2014 Elsevier Interactive Patient Education  2017 [employer].

## 2017-01-09 LAB — HEMOGLOBIN A1C
Hgb A1c MFr Bld: 6.4 % — ABNORMAL HIGH (ref <5.7)
Mean Plasma Glucose: 137 mg/dL

## 2017-01-09 LAB — TSH: TSH: 1.46 m[IU]/L

## 2017-02-03 MED FILL — rOPINIRole HCL 0.25 MG TABS: 0.25 | 23 days supply | Qty: 45 | Fill #1

## 2017-02-04 MED FILL — LISINOPRIL 10 MG TABLET: 10 | 90 days supply | Qty: 90 | Fill #1

## 2017-03-19 ENCOUNTER — Other Ambulatory Visit: Payer: Self-pay

## 2017-03-19 DIAGNOSIS — G2581 Restless legs syndrome: Secondary | ICD-10-CM

## 2017-03-19 MED ORDER — ROPINIROLE HCL 1 MG PO TABS
1.0000 mg | ORAL_TABLET | Freq: Three times a day (TID) | ORAL | 0 refills | Status: DC
Start: 2017-03-19 — End: 2017-06-17

## 2017-03-20 NOTE — Progress Notes (Signed)
Follow-up Visit   Date: 03/21/17    Rachel Daniel MRN: [id number] DOB: [birth year]   Interim History: Rachel Daniel is a 66 y.o. right-handed Caucasian female with diabetes mellitus (HbA1c 6.5), hypertension, RLS returning to the clinic for follow-up of restless leg syndrome.  The patient was accompanied to the clinic by  who also provides collateral information.   History of present illness: Starting around her 22s, she recalls having achy pain of the right thigh, which would prohibit her from falling asleep.  Her husband often complained that she would kick him all night long.  She has squeezing, stabbing, knife-like pain of bilateral thighs. She does not have numbness or tingling.  Pain is mostly over the anterior and lateral portion of the thigh, but can involve the left medial thigh.  The lower legs and feet are spared.  She only has relief with walking and stretching.  Pain was severe at night time, but over the past several years started having daytime symptoms and she cannot get relief with anything.  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 Her pain is better on the weekends when she is less sedentary.  She was diagnosed with RLS in her 38s and started on ropinorole and takes 3mg  at bedtime which provides relief until lunch time.  She is unable to take ropinorole during the day because of sedation.  She is intolerant to Lyrica.  She denies any weakness.    She also complains of numbness of the right great toe since her 52s.  This has been constant and there is no tingling or pain.  She denies weakness.  Symptoms have not progressed and do not bother her.  She does not have similar symptoms on the left foot.  UPDATE 03/21/2017:  She is here for 3 month follow-up visit and reports having significant improvement of her daytime RLS symptoms with the addition of ropinirole 0.25mg .  She still has some breakthrough discomfort, but does not wish to increase the  medication.  She sleeps well throughout the night on ropinirole 3mg  at bedtime. Right toe numbness is unchanged and long standing, she does not have similar symptoms on the left. No new complaints.   Medications:  Current Outpatient Prescriptions on File Prior to Visit  Medication Sig Dispense Refill  . aspirin EC 81 MG tablet Take 81 mg by mouth daily.    Marland Kitchen CRANBERRY EXTRACT PO Take 1 tablet by mouth daily.      . cyclobenzaprine (FLEXERIL) 10 MG tablet Take 10 mg by mouth at bedtime.     Marland Kitchen ibuprofen (ADVIL,MOTRIN) 600 MG tablet Take 600 mg by mouth every morning.    Marland Kitchen lisinopril (PRINIVIL,ZESTRIL) 10 MG tablet TAKE 1/2 TO 1 TABLET BY MOUTH IN THE MORNING FOR HYPERTENSION/KIDNEY PROTECTION FROM DIABETES 90 tablet 1  . Multiple Vitamins-Minerals (MULTIVITAMIN PO) Take by mouth daily.    . Phenazopyridine HCl (AZO TABS PO) Take 2 tablets by mouth daily.    Marland Kitchen rOPINIRole (REQUIP) 1 MG tablet Take 1 tablet (1 mg total) by mouth 3 (three) times daily. 270 tablet 0  . VITAMIN D, CHOLECALCIFEROL, PO Take 4,000 Units by mouth daily.      Current Facility-Administered Medications on File Prior to Visit  Medication Dose Route Frequency Provider Last Rate Last Dose  . 0.9 %  sodium chloride infusion  500 mL Intravenous Continuous Doran Stabler, MD        Allergies:  Allergies  Allergen Reactions  . Hctz [Hydrochlorothiazide]     Leg cramping  . Metformin And Related Nausea Only    Review of Systems:  CONSTITUTIONAL: No fevers, chills, night sweats, or weight loss.  EYES: No visual changes or eye pain ENT: No hearing changes.  No history of nose bleeds.   RESPIRATORY: No cough, wheezing and shortness of breath.   CARDIOVASCULAR: Negative for chest pain, and palpitations.   GI: Negative for abdominal discomfort, blood in stools or black stools.  No recent change in bowel habits.   GU:  No history of incontinence.   MUSCLOSKELETAL: No history of joint pain or swelling.  No myalgias.     SKIN: Negative for lesions, rash, and itching.   ENDOCRINE: Negative for cold or heat intolerance, polydipsia or goiter.   PSYCH:  No depression or anxiety symptoms.   NEURO: As Above.   Vital Signs:  BP 140/80   Pulse 86   Ht 5\' 5"  (1.651 m)   Wt 171 lb 7 oz (77.8 kg)   SpO2 97%   BMI 28.53 kg/m   Neurological Exam: MENTAL STATUS including orientation to time, place, person, and recent and remote memory is normal.  Speech is not dysarthric.  CRANIAL NERVES:  Face is symmetric.   MOTOR:  Motor strength is 5/5 in all extremities.   MSRs:  Reflexes are 2+/4 throughout.  SENSORY:   Reduced vibration and temperature over the right great toe, otherwise normal and symmetric perception of light touch, pinprick, vibration, and proprioception.   COORDINATION/GAIT:  Normal finger-to- nose-finger and heel-to-shin.  Intact rapid alternating movements bilaterally.  Gait narrow based and stable.   Data: Lab Results  Component Value Date   FERRITIN 100 10/10/2016   Lab Results  Component Value Date   TSH 1.46 01/08/2017   Lab Results  Component Value Date   YHTMBPJP21 624 04/06/2015    IMPRESSION/PLAN:  1.  Restless leg syndrome affecting bilateral thigh and responsive to dopamine agonist.   - She is doing well on ropinirole 3mg  which controls her nighttime symptoms, but over the past few years, daytime symptoms have become very intense.    - At her last visit, I added ropinirole 0.25mg  to the afternoon which has helped significantly arguing against augmentation  - She will be continued ropinirole 0.25mg  at 3pm and continue 3mg  at 8pm.  2.  Right great toe numbness, likely due to Morton's neuroma.  She does not have features suggestive of diabetic neuropathy. If her numbness progresses, proceed with NCS/EMG.  Return to clinic in 6 months  The duration of this appointment visit was 20 minutes of face-to-face time with the patient.  Greater than 50% of this time was spent in  counseling, explanation of diagnosis, planning of further management, and coordination of care.   Thank you for allowing me to participate in patient's care.  If I can answer any additional questions, I would be pleased to do so.    Sincerely,    Joycelin Radloff K. Posey Pronto, DO

## 2017-03-21 ENCOUNTER — Ambulatory Visit (INDEPENDENT_AMBULATORY_CARE_PROVIDER_SITE_OTHER): Payer: 59 | Admitting: Neurology

## 2017-03-21 ENCOUNTER — Encounter: Payer: Self-pay | Admitting: Neurology

## 2017-03-21 VITALS — BP 140/80 | HR 86 | Ht 65.0 in | Wt 171.4 lb

## 2017-03-21 DIAGNOSIS — G2581 Restless legs syndrome: Secondary | ICD-10-CM | POA: Diagnosis not present

## 2017-03-21 DIAGNOSIS — G5761 Lesion of plantar nerve, right lower limb: Secondary | ICD-10-CM

## 2017-03-21 MED ORDER — ROPINIROLE HCL 0.25 MG PO TABS: ORAL_TABLET | ORAL | 3 refills | Status: DC

## 2017-03-21 NOTE — Patient Instructions (Signed)
Return to clinic in a 1 year

## 2017-04-03 ENCOUNTER — Other Ambulatory Visit: Payer: Self-pay | Admitting: Physician Assistant

## 2017-04-03 DIAGNOSIS — Z1231 Encounter for screening mammogram for malignant neoplasm of breast: Secondary | ICD-10-CM

## 2017-04-09 ENCOUNTER — Encounter: Payer: Self-pay | Admitting: Physician Assistant

## 2017-04-14 NOTE — Progress Notes (Signed)
CPE and Medicare wellness visit  Assessment and Plan: Essential hypertension, benign - continue medications, DASH diet, exercise and monitor at home. Call if greater than 130/80. - GET ON AND STAY ON LISINOPRIL  - CBC with Differential/Platelet - BASIC METABOLIC PANEL WITH GFR - Hepatic function panel - TSH - Urinalysis, Routine w reflex microscopic (not at [facility]) - Microalbumin / creatinine urine ratio - EKG 12-Lead   Uncontrolled secondary diabetes mellitus with stage 2 CKD (GFR 60-89) Discussed general issues about diabetes pathophysiology and management., Educational material distributed., Suggested low cholesterol diet., Encouraged aerobic exercise., Discussed foot care., Reminded to get yearly retinal exam. - Hemoglobin A1c - Insulin, fasting   Restless leg syndrome Continue requip   Hyperlipidemia -continue medications, check lipids, decrease fatty foods, increase activity.  - Lipid panel   Diverticulosis of large intestine without hemorrhage Remission, increase fiber  Vitamin D deficiency - Vit D  25 hydroxy (rtn osteoporosis monitoring)   Medication management - Magnesium  Gastroesophageal reflux disease with esophagitis Continue PPI/H2 blocker, diet discussed  Medicare encounter  Diabetic autonomic neuropathy associated with type 2 diabetes mellitus Do daily feet checks   Discussed med's effects and SE's. Screening labs and tests as requested with regular follow-up as recommended.  During the course of the visit the patient was educated and counseled about appropriate screening and preventive services including:    Pneumococcal vaccine   Influenza vaccine  Td vaccine  Prevnar 13  Screening electrocardiogram  Screening mammography  Bone densitometry screening  Colorectal cancer screening  Diabetes screening  Glaucoma screening  Nutrition counseling   Advanced directives: given info/requested copies   HPI 66 y.o. female  presents for  a complete physical and medicare wellness. She retired May 2nd, states she is doing well.  Her blood pressure has been controlled at home, today their BP is BP: 138/80 She does workout, walks the dog. She denies chest pain, shortness of breath, dizziness.  She is not on cholesterol medication and denies myalgias. Her cholesterol is at goal. The cholesterol last visit was:   Lab Results  Component Value Date   CHOL 174 01/08/2017   HDL 35 (L) 01/08/2017   LDLCALC 107 (H) 01/08/2017   TRIG 160 (H) 01/08/2017   CHOLHDL 5.0 (H) 01/08/2017   She has been working on diet and exercise for diabetes with CKD stage II but could not tolerate the metformin, she is on bASA, she is on an ACE, and denies polydipsia, polyuria and visual disturbances.  Last A1C in the office was:  Lab Results  Component Value Date   HGBA1C 6.4 (H) 01/08/2017   Lab Results  Component Value Date   GFRNONAA 51 (L) 01/08/2017   Patient is on Vitamin D supplement.   Lab Results  Component Value Date   VD25OH 72 10/10/2016   She has GERD She is on requip for RLS which helps, she follows with Dr. Posey Pronto, on 2 at night and occ 0.25mg  during the day  BMI is Body mass index is 28.46 kg/m., she is working on diet and exercise. Wt Readings from Last 3 Encounters:  04/15/17 171 lb (77.6 kg)  03/21/17 171 lb 7 oz (77.8 kg)  01/08/17 174 lb (78.9 kg)     Current Medications:  Current Outpatient Prescriptions on File Prior to Visit  Medication Sig  . aspirin EC 81 MG tablet Take 81 mg by mouth daily.  Marland Kitchen CRANBERRY EXTRACT PO Take 1 tablet by mouth daily.    . cyclobenzaprine (FLEXERIL)  10 MG tablet Take 10 mg by mouth at bedtime.   Marland Kitchen ibuprofen (ADVIL,MOTRIN) 600 MG tablet Take 600 mg by mouth every morning.  Marland Kitchen lisinopril (PRINIVIL,ZESTRIL) 10 MG tablet TAKE 1/2 TO 1 TABLET BY MOUTH IN THE MORNING FOR HYPERTENSION/KIDNEY PROTECTION FROM DIABETES  . Multiple Vitamins-Minerals (MULTIVITAMIN PO) Take by mouth daily.  .  Phenazopyridine HCl (AZO TABS PO) Take 2 tablets by mouth daily.  Marland Kitchen rOPINIRole (REQUIP) 0.25 MG tablet Take 1 tablet at 11am x 1 week, then increase to 2 tablets.  Marland Kitchen rOPINIRole (REQUIP) 1 MG tablet Take 1 tablet (1 mg total) by mouth 3 (three) times daily.  Marland Kitchen VITAMIN D, CHOLECALCIFEROL, PO Take 4,000 Units by mouth daily.    Current Facility-Administered Medications on File Prior to Visit  Medication  . 0.9 %  sodium chloride infusion   Health Maintenance:   Immunization History  Administered Date(s) Administered  . Pneumococcal Conjugate-13 04/05/2016  . Tdap 03/07/2011   Tetanus: 2012 Pneumovax: DUE Prevnar 13: 2017 Flu vaccine: 2017 at work Zostavax: declines  Pap: 2016 negative MGM: 04/2016 scheduled for July 28th DEXA: 2008 normal Colonoscopy: 06/2016, Dr. Deatra Ina EGD: N/A MRI lumbar spine 2013 CXR 2017 Eye exam: Dr. Rodman Key 06/2016 Dentist: Dr. Harrington Challenger q 6 months 11/2016 Dr. Lynann Bologna ortho  Allergies:  Allergies  Allergen Reactions  . Hctz [Hydrochlorothiazide]     Leg cramping  . Metformin And Related Nausea Only   Medical History:  Past Medical History:  Diagnosis Date  . Allergy    otc med prn  . Arthritis    hips, lower back  . Cataract    bilateral  . Chronic kidney disease    stage 3 per patient   . Diet-controlled type 2 diabetes mellitus (HCC)    Diet controlled, no meds  . Diverticulosis   . Dribbling urine   . EKG abnormalities    pt states she had a BBB on her last EKG, no problems  . Essential hypertension, benign 08/19/2013  . HPV in female   . Hyperlipidemia    diet controlled, no med  . Restless leg syndrome   . Snoring   . Vitamin D deficiency    Surgical History:  She  has a past surgical history that includes Vaginal hysterectomy (1992); Tubal ligation (1981); Ovarian cyst removal (1981); Breast biopsy (1969); Lumbar laminectomy/decompression microdiscectomy (05/07/2012); and Colonoscopy (2012).  Family History:  Her family history  includes Colon cancer (age of onset: 34) in her mother; Colon polyps in her brother and sister; Diabetes in her mother; Heart failure in her mother; Heart failure (age of onset: 27) in her father; Hypertension in her mother; Stroke in her mother; Stroke (age of onset: 61) in her sister.   Social History:  She  reports that she is a non-smoker but has been exposed to tobacco smoke. She has never used smokeless tobacco. She reports that she does not drink alcohol or use drugs.  MEDICARE WELLNESS OBJECTIVES: Physical activity: Current Exercise Habits: Home exercise routine, Type of exercise: walking, Time (Minutes): 40, Frequency (Times/Week): 5, Weekly Exercise (Minutes/Week): 200, Intensity: Moderate Cardiac risk factors: Cardiac Risk Factors include: advanced age (>49men, >85 women);diabetes mellitus;dyslipidemia;hypertension;family history of premature cardiovascular disease Depression/mood screen:   Depression screen [facility] 2/9 04/15/2017  Decreased Interest 0  Down, Depressed, Hopeless 0  PHQ - 2 Score 0    ADLs:  In your present state of health, do you have any difficulty performing the following activities: 04/15/2017 10/11/2016  Hearing? N N  Vision? N N  Difficulty concentrating or making decisions? - N  Walking or climbing stairs? N N  Dressing or bathing? N N  Doing errands, shopping? N N  Some recent data might be hidden    Cognitive Testing  Alert? Yes  Normal Appearance?Yes  Oriented to person? Yes  Place? Yes   Time? Yes  Recall of three objects?  Yes  Can perform simple calculations? Yes  Displays appropriate judgment?Yes  Can read the correct time from a watch face?Yes  EOL planning: Does Patient Have a Medical Advance Directive?: No Would patient like information on creating a medical advance directive?: Yes (MAU/Ambulatory/Procedural Areas - Information given)  Review of Systems  Constitutional: Negative.   HENT: Negative.   Eyes: Negative.  Negative for blurred  vision.  Respiratory: Negative for cough, hemoptysis, sputum production, shortness of breath and wheezing.   Cardiovascular: Negative.  Negative for chest pain.  Gastrointestinal: Negative.   Genitourinary: Negative.        Stress incontinence  Musculoskeletal: Positive for myalgias (bilateral legs). Negative for back pain, falls, joint pain and neck pain.  Skin: Negative for itching and rash.  Neurological: Positive for tingling and sensory change (bilateral feet).  Psychiatric/Behavioral: Negative.    Physical Exam: Estimated body mass index is 28.46 kg/m as calculated from the following:   Height as of this encounter: 5\' 5"  (1.651 m).   Weight as of this encounter: 171 lb (77.6 kg). BP 138/80   Pulse 62   Temp (!) 97.5 F (36.4 C) (Temporal)   Resp 14   Ht 5\' 5"  (1.651 m)   Wt 171 lb (77.6 kg)   BMI 28.46 kg/m  General Appearance: Well nourished, in no apparent distress. Eyes: PERRLA, EOMs, conjunctiva no swelling or erythema, normal fundi and vessels. Sinuses: No Frontal/maxillary tenderness ENT/Mouth: Ext aud canals clear, normal light reflex with TMs without erythema, bulging.  Good dentition. No erythema, swelling, or exudate on post pharynx. Tonsils not swollen or erythematous. Hearing normal.  Neck: Supple, thyroid normal. No bruits Respiratory: Respiratory effort normal, BS equal bilaterally without rales, rhonchi, wheezing or stridor. Cardio: RRR without murmurs, rubs or gallops. Brisk peripheral pulses without edema.  Chest: symmetric, with normal excursions and percussion. Breasts: Symmetric, without lumps, nipple discharge, retractions. Abdomen: Soft, +BS, Non tender, no guarding, rebound, hernias, masses, or organomegaly. .  Lymphatics: Non tender without lymphadenopathy.  Genitourinary: defer Musculoskeletal: Full ROM all peripheral extremities,5/5 strength, and normal gait.  Skin: + varicose veins bilateral legs. Warm, dry without rashes, lesions, ecchymosis.   Neuro: Cranial nerves intact, reflexes equal bilaterally. Normal muscle tone, no cerebellar symptoms. Sensation decreased bilateral feet to mid shin.  Psych: Awake and oriented X 3, normal affect, Insight and Judgment appropriate.   EKG: WNL no changes, IRBBB AORTA SCAN: defer  Medicare Attestation I have personally reviewed: The patient's medical and social history Their use of alcohol, tobacco or illicit drugs Their current medications and supplements The patient's functional ability including ADLs,fall risks, home safety risks, cognitive, and hearing and visual impairment Diet and physical activities Evidence for depression or mood disorders  The patient's weight, height, BMI, and visual acuity have been recorded in the chart.  I have made referrals, counseling, and provided education to the patient based on review of the above and I have provided the patient with a written personalized care plan for preventive services.     Vicie Mutters 3:26 PM

## 2017-04-15 ENCOUNTER — Ambulatory Visit (INDEPENDENT_AMBULATORY_CARE_PROVIDER_SITE_OTHER): Payer: Medicare Other | Admitting: Physician Assistant

## 2017-04-15 ENCOUNTER — Encounter: Payer: Self-pay | Admitting: Physician Assistant

## 2017-04-15 VITALS — BP 138/80 | HR 62 | Temp 97.5°F | Resp 14 | Ht 65.0 in | Wt 171.0 lb

## 2017-04-15 DIAGNOSIS — E559 Vitamin D deficiency, unspecified: Secondary | ICD-10-CM

## 2017-04-15 DIAGNOSIS — E1142 Type 2 diabetes mellitus with diabetic polyneuropathy: Secondary | ICD-10-CM

## 2017-04-15 DIAGNOSIS — K5730 Diverticulosis of large intestine without perforation or abscess without bleeding: Secondary | ICD-10-CM

## 2017-04-15 DIAGNOSIS — Z23 Encounter for immunization: Secondary | ICD-10-CM | POA: Diagnosis not present

## 2017-04-15 DIAGNOSIS — E782 Mixed hyperlipidemia: Secondary | ICD-10-CM | POA: Diagnosis not present

## 2017-04-15 DIAGNOSIS — I10 Essential (primary) hypertension: Secondary | ICD-10-CM | POA: Diagnosis not present

## 2017-04-15 DIAGNOSIS — Z79899 Other long term (current) drug therapy: Secondary | ICD-10-CM

## 2017-04-15 DIAGNOSIS — K2100 Gastro-esophageal reflux disease with esophagitis, without bleeding: Secondary | ICD-10-CM

## 2017-04-15 DIAGNOSIS — E1322 Other specified diabetes mellitus with diabetic chronic kidney disease: Secondary | ICD-10-CM

## 2017-04-15 DIAGNOSIS — G5713 Meralgia paresthetica, bilateral lower limbs: Secondary | ICD-10-CM | POA: Diagnosis not present

## 2017-04-15 DIAGNOSIS — G2581 Restless legs syndrome: Secondary | ICD-10-CM

## 2017-04-15 DIAGNOSIS — N182 Chronic kidney disease, stage 2 (mild): Secondary | ICD-10-CM | POA: Diagnosis not present

## 2017-04-15 DIAGNOSIS — E1365 Other specified diabetes mellitus with hyperglycemia: Secondary | ICD-10-CM

## 2017-04-15 DIAGNOSIS — D508 Other iron deficiency anemias: Secondary | ICD-10-CM

## 2017-04-15 DIAGNOSIS — R6889 Other general symptoms and signs: Secondary | ICD-10-CM

## 2017-04-15 DIAGNOSIS — Z0000 Encounter for general adult medical examination without abnormal findings: Secondary | ICD-10-CM

## 2017-04-15 DIAGNOSIS — Z0001 Encounter for general adult medical examination with abnormal findings: Secondary | ICD-10-CM

## 2017-04-15 DIAGNOSIS — IMO0002 Reserved for concepts with insufficient information to code with codable children: Secondary | ICD-10-CM

## 2017-04-15 LAB — CBC WITH DIFFERENTIAL/PLATELET
BASOS PCT: 0 %
Basophils Absolute: 0 cells/uL (ref 0–200)
Eosinophils Absolute: 228 cells/uL (ref 15–500)
Eosinophils Relative: 4 %
HEMATOCRIT: 41.2 % (ref 35.0–45.0)
Hemoglobin: 13.7 g/dL (ref 11.7–15.5)
LYMPHS PCT: 32 %
Lymphs Abs: 1824 cells/uL (ref 850–3900)
MCH: 29.5 pg (ref 27.0–33.0)
MCHC: 33.3 g/dL (ref 32.0–36.0)
MCV: 88.6 fL (ref 80.0–100.0)
MONO ABS: 399 {cells}/uL (ref 200–950)
MONOS PCT: 7 %
MPV: 9.4 fL (ref 7.5–12.5)
NEUTROS ABS: 3249 {cells}/uL (ref 1500–7800)
Neutrophils Relative %: 57 %
PLATELETS: 169 10*3/uL (ref 140–400)
RBC: 4.65 MIL/uL (ref 3.80–5.10)
RDW: 13.9 % (ref 11.0–15.0)
WBC: 5.7 10*3/uL (ref 3.8–10.8)

## 2017-04-15 NOTE — Patient Instructions (Signed)
Pneumococcal Conjugate Vaccine suspension for injection What is this medicine? PNEUMOCOCCAL VACCINE (NEU mo KOK al vak SEEN) is a vaccine used to prevent pneumococcus bacterial infections. These bacteria can cause serious infections like pneumonia, meningitis, and blood infections. This vaccine will lower your chance of getting pneumonia. If you do get pneumonia, it can make your symptoms milder and your illness shorter. This vaccine will not treat an infection and will not cause infection. This vaccine is recommended for infants and young children, adults with certain medical conditions, and adults 65 years or older. This medicine may be used for other purposes; ask your health care provider or pharmacist if you have questions. COMMON BRAND NAME(S): Prevnar, Prevnar 13 What should I tell my health care provider before I take this medicine? They need to know if you have any of these conditions: -bleeding problems -fever -immune system problems -an unusual or allergic reaction to pneumococcal vaccine, diphtheria toxoid, other vaccines, latex, other medicines, foods, dyes, or preservatives -pregnant or trying to get pregnant -breast-feeding How should I use this medicine? This vaccine is for injection into a muscle. It is given by a health care professional. A copy of Vaccine Information Statements will be given before each vaccination. Read this sheet carefully each time. The sheet may change frequently. Talk to your pediatrician regarding the use of this medicine in children. While this drug may be prescribed for children as young as 6 weeks old for selected conditions, precautions do apply. Overdosage: If you think you have taken too much of this medicine contact a poison control center or emergency room at once. NOTE: This medicine is only for you. Do not share this medicine with others. What if I miss a dose? It is important not to miss your dose. Call your doctor or health care professional  if you are unable to keep an appointment. What may interact with this medicine? -medicines for cancer chemotherapy -medicines that suppress your immune function -steroid medicines like prednisone or cortisone This list may not describe all possible interactions. Give your health care provider a list of all the medicines, herbs, non-prescription drugs, or dietary supplements you use. Also tell them if you smoke, drink alcohol, or use illegal drugs. Some items may interact with your medicine. What should I watch for while using this medicine? Mild fever and pain should go away in 3 days or less. Report any unusual symptoms to your doctor or health care professional. What side effects may I notice from receiving this medicine? Side effects that you should report to your doctor or health care professional as soon as possible: -allergic reactions like skin rash, itching or hives, swelling of the face, lips, or tongue -breathing problems -confused -fast or irregular heartbeat -fever over 102 degrees F -seizures -unusual bleeding or bruising -unusual muscle weakness Side effects that usually do not require medical attention (report to your doctor or health care professional if they continue or are bothersome): -aches and pains -diarrhea -fever of 102 degrees F or less -headache -irritable -loss of appetite -pain, tender at site where injected -trouble sleeping This list may not describe all possible side effects. Call your doctor for medical advice about side effects. You may report side effects to FDA at 1-800-FDA-1088. Where should I keep my medicine? This does not apply. This vaccine is given in a clinic, pharmacy, doctor's office, or other health care setting and will not be stored at home. NOTE: This sheet is a summary. It may not cover all possible information.   If you have questions about this medicine, talk to your doctor, pharmacist, or health care provider.  2018 Elsevier/Gold  Standard (2014-07-07 10:27:27)  

## 2017-04-16 LAB — URINALYSIS, ROUTINE W REFLEX MICROSCOPIC
BILIRUBIN URINE: NEGATIVE
GLUCOSE, UA: NEGATIVE
Hgb urine dipstick: NEGATIVE
Ketones, ur: NEGATIVE
Leukocytes, UA: NEGATIVE
Nitrite: NEGATIVE
Protein, ur: NEGATIVE
SPECIFIC GRAVITY, URINE: 1.006 (ref 1.001–1.035)
pH: 6 (ref 5.0–8.0)

## 2017-04-16 LAB — MAGNESIUM: Magnesium: 2 mg/dL (ref 1.5–2.5)

## 2017-04-16 LAB — BASIC METABOLIC PANEL WITH GFR
BUN: 21 mg/dL (ref 7–25)
CO2: 25 mmol/L (ref 20–31)
Calcium: 9.3 mg/dL (ref 8.6–10.4)
Chloride: 103 mmol/L (ref 98–110)
Creat: 1.22 mg/dL — ABNORMAL HIGH (ref 0.50–0.99)
GFR, EST AFRICAN AMERICAN: 53 mL/min — AB (ref >=60)
GFR, EST NON AFRICAN AMERICAN: 46 mL/min — AB (ref >=60)
Glucose, Bld: 118 mg/dL — ABNORMAL HIGH (ref 65–99)
POTASSIUM: 4.3 mmol/L (ref 3.5–5.3)
Sodium: 141 mmol/L (ref 135–146)

## 2017-04-16 LAB — HEPATIC FUNCTION PANEL
ALK PHOS: 70 U/L (ref 33–130)
ALT: 15 U/L (ref 6–29)
AST: 18 U/L (ref 10–35)
Albumin: 4.2 g/dL (ref 3.6–5.1)
BILIRUBIN INDIRECT: 0.3 mg/dL (ref 0.2–1.2)
BILIRUBIN TOTAL: 0.4 mg/dL (ref 0.2–1.2)
Bilirubin, Direct: 0.1 mg/dL (ref <=0.2)
Total Protein: 7.3 g/dL (ref 6.1–8.1)

## 2017-04-16 LAB — TSH: TSH: 1.48 mIU/L

## 2017-04-16 LAB — LIPID PANEL
Cholesterol: 168 mg/dL (ref <200)
HDL: 36 mg/dL — AB (ref >50)
LDL CALC: 103 mg/dL — AB (ref <100)
TRIGLYCERIDES: 145 mg/dL (ref <150)
Total CHOL/HDL Ratio: 4.7 Ratio (ref <5.0)
VLDL: 29 mg/dL (ref <30)

## 2017-04-16 LAB — MICROALBUMIN / CREATININE URINE RATIO
CREATININE, URINE: 36 mg/dL (ref 20–320)
Microalb Creat Ratio: 6 mcg/mg creat (ref <30)
Microalb, Ur: 0.2 mg/dL

## 2017-04-16 LAB — HEMOGLOBIN A1C
Hgb A1c MFr Bld: 6.7 % — ABNORMAL HIGH (ref <5.7)
Mean Plasma Glucose: 146 mg/dL

## 2017-04-17 NOTE — Progress Notes (Signed)
Pt aware of lab results & voiced understanding of those results.

## 2017-05-09 ENCOUNTER — Ambulatory Visit
Admission: RE | Admit: 2017-05-09 | Discharge: 2017-05-09 | Disposition: A | Discharge status: Home / Self Care | Payer: Medicare Other | Source: Ambulatory Visit | Attending: Physician Assistant | Admitting: Physician Assistant

## 2017-05-09 DIAGNOSIS — Z1231 Encounter for screening mammogram for malignant neoplasm of breast: Secondary | ICD-10-CM

## 2017-06-17 ENCOUNTER — Other Ambulatory Visit: Payer: Self-pay | Admitting: Physician Assistant

## 2017-06-17 DIAGNOSIS — G2581 Restless legs syndrome: Secondary | ICD-10-CM

## 2017-08-01 ENCOUNTER — Ambulatory Visit (INDEPENDENT_AMBULATORY_CARE_PROVIDER_SITE_OTHER): Payer: Medicare Other | Admitting: Internal Medicine

## 2017-08-01 VITALS — BP 144/82 | HR 64 | Temp 97.2°F | Resp 18 | Ht 65.0 in | Wt 174.0 lb

## 2017-08-01 DIAGNOSIS — I10 Essential (primary) hypertension: Secondary | ICD-10-CM

## 2017-08-01 DIAGNOSIS — E559 Vitamin D deficiency, unspecified: Secondary | ICD-10-CM

## 2017-08-01 DIAGNOSIS — Z23 Encounter for immunization: Secondary | ICD-10-CM

## 2017-08-01 DIAGNOSIS — E782 Mixed hyperlipidemia: Secondary | ICD-10-CM

## 2017-08-01 DIAGNOSIS — Z79899 Other long term (current) drug therapy: Secondary | ICD-10-CM

## 2017-08-01 DIAGNOSIS — E119 Type 2 diabetes mellitus without complications: Secondary | ICD-10-CM | POA: Insufficient documentation

## 2017-08-01 DIAGNOSIS — E1122 Type 2 diabetes mellitus with diabetic chronic kidney disease: Secondary | ICD-10-CM

## 2017-08-01 DIAGNOSIS — N182 Chronic kidney disease, stage 2 (mild): Secondary | ICD-10-CM

## 2017-08-01 NOTE — Progress Notes (Signed)
This very nice 66 y.o. WWFpresents for  follow up with Hypertension, Hyperlipidemia, Pre-Diabetes and Vitamin D Deficiency.      Patient is treated for HTN & BP has been controlled at home. Today's BP is not at goal  144/82 by Nurse and 180/100 by my recheck.  Patient has had no complaints of any cardiac type chest pain, palpitations, dyspnea / orthopnea / PND, dizziness, claudication, or dependent edema.     Hyperlipidemia is controlled with diet & meds. Patient denies myalgias or other med SE's. Last Lipids were  Lab Results  Component Value Date   CHOL 168 04/15/2017   HDL 36 (L) 04/15/2017   LDLCALC 103 (H) 04/15/2017   TRIG 145 04/15/2017   CHOLHDL 4.7 04/15/2017      Also, the patient has history of T2_NIDDM  And is attempting to control with diet. She denies symptoms of reactive hypoglycemia, diabetic polys, paresthesias or visual blurring.  Last A1c was not at goal: Lab Results  Component Value Date   HGBA1C 6.7 (H) 04/15/2017      Further, the patient also has history of Vitamin D Deficiency and supplements vitamin D without any suspected side-effects. Last vitamin D was at goal:  Lab Results  Component Value Date   VD25OH 72 10/10/2016   Current Outpatient Prescriptions on File Prior to Visit  Medication Sig  . aspirin EC 81 MG tablet Take 81 mg by mouth daily.  Marland Kitchen CRANBERRY EXTRACT PO Take 1 tablet by mouth daily.    . cyclobenzaprine (FLEXERIL) 10 MG tablet Take 10 mg by mouth at bedtime.   Marland Kitchen ibuprofen (ADVIL,MOTRIN) 600 MG tablet Take 600 mg by mouth every morning.  Marland Kitchen lisinopril (PRINIVIL,ZESTRIL) 10 MG tablet TAKE 1/2 TO 1 TABLET BY MOUTH IN THE MORNING FOR HYPERTENSION/KIDNEY PROTECTION FROM DIABETES  . Multiple Vitamins-Minerals (MULTIVITAMIN PO) Take by mouth daily.  . Phenazopyridine HCl (AZO TABS PO) Take 2 tablets by mouth daily.  Marland Kitchen rOPINIRole (REQUIP) 0.25 MG tablet Take 1 tablet at 11am x 1 week, then increase to 2 tablets.  Marland Kitchen rOPINIRole (REQUIP) 1 MG  tablet TAKE 1 TABLET(1 MG) BY MOUTH THREE TIMES DAILY  . VITAMIN D, CHOLECALCIFEROL, PO Take 4,000 Units by mouth daily.    Current Facility-Administered Medications on File Prior to Visit  Medication  . 0.9 %  sodium chloride infusion   Allergies  Allergen Reactions  . Hctz [Hydrochlorothiazide]     Leg cramping  . Metformin And Related Nausea Only   PMHx:   Past Medical History:  Diagnosis Date  . Allergy    otc med prn  . Arthritis    hips, lower back  . Cataract    bilateral  . Chronic kidney disease    stage 3 per patient   . Diet-controlled type 2 diabetes mellitus (HCC)    Diet controlled, no meds  . Diverticulosis   . Dribbling urine   . EKG abnormalities    pt states she had a BBB on her last EKG, no problems  . Essential hypertension, benign 08/19/2013  . HPV in female   . Hyperlipidemia    diet controlled, no med  . Restless leg syndrome   . Snoring   . Vitamin D deficiency    Immunization History  Administered Date(s) Administered  . Influenza, High Dose Seasonal PF 08/01/2017  . Pneumococcal Conjugate-13 04/05/2016  . Pneumococcal Polysaccharide-23 04/15/2017  . Tdap 03/07/2011   Past Surgical History:  Procedure Laterality Date  .  BREAST BIOPSY Left 1969   left lumpectomy - benign  . COLONOSCOPY  2012   kaplan-polyps, tics  . LUMBAR LAMINECTOMY/DECOMPRESSION MICRODISCECTOMY  05/07/2012   Procedure: LUMBAR LAMINECTOMY/DECOMPRESSION MICRODISCECTOMY;  Surgeon: Sinclair Ship, MD;  Location: [place];  Service: Orthopedics;  Laterality: Bilateral;  Lumbar 4-5 decompression  . OVARIAN CYST REMOVAL  1981   right  . TUBAL LIGATION  1981  . VAGINAL HYSTERECTOMY  1992   FHx:    Reviewed / unchanged  SHx:    Reviewed / unchanged  Systems Review:  Constitutional: Denies fever, chills, wt changes, headaches, insomnia, fatigue, night sweats, change in appetite. Eyes: Denies redness, blurred vision, diplopia, discharge, itchy, watery eyes.  ENT:  Denies discharge, congestion, post nasal drip, epistaxis, sore throat, earache, hearing loss, dental pain, tinnitus, vertigo, sinus pain, snoring.  CV: Denies chest pain, palpitations, irregular heartbeat, syncope, dyspnea, diaphoresis, orthopnea, PND, claudication or edema. Respiratory: denies cough, dyspnea, DOE, pleurisy, hoarseness, laryngitis, wheezing.  Gastrointestinal: Denies dysphagia, odynophagia, heartburn, reflux, water brash, abdominal pain or cramps, nausea, vomiting, bloating, diarrhea, constipation, hematemesis, melena, hematochezia  or hemorrhoids. Genitourinary: Denies dysuria, frequency, urgency, nocturia, hesitancy, discharge, hematuria or flank pain. Musculoskeletal: Denies arthralgias, myalgias, stiffness, jt. swelling, pain, limping or strain/sprain.  Skin: Denies pruritus, rash, hives, warts, acne, eczema or change in skin lesion(s). Neuro: No weakness, tremor, incoordination, spasms, paresthesia or pain. Psychiatric: Denies confusion, memory loss or sensory loss. Endo: Denies change in weight, skin or hair change.  Heme/Lymph: No excessive bleeding, bruising or enlarged lymph nodes.  Physical Exam  BP (!) 144/82   Pulse 64   Temp (!) 97.2 F (36.2 C)   Resp 18   Ht 5\' 5"  (1.651 m)   Wt 174 lb (78.9 kg)   BMI 28.96 kg/m    BP's rechecked 180/100 RA and 180/86 LA  Appears well nourished, well groomed  and in no distress.  Eyes: PERRLA, EOMs, conjunctiva no swelling or erythema. Sinuses: No frontal/maxillary tenderness ENT/Mouth: EAC's clear, TM's nl w/o erythema, bulging. Nares clear w/o erythema, swelling, exudates. Oropharynx clear without erythema or exudates. Oral hygiene is good. Tongue normal, non obstructing. Hearing intact.  Neck: Supple. Thyroid nl. Car 2+/2+ without bruits, nodes or JVD. Chest: Respirations nl with BS clear & equal w/o rales, rhonchi, wheezing or stridor.  Cor: Heart sounds normal w/ regular rate and rhythm without sig. murmurs,  gallops, clicks or rubs. Peripheral pulses normal and equal  without edema.  Abdomen: Soft & bowel sounds normal. Non-tender w/o guarding, rebound, hernias, masses or organomegaly.  Lymphatics: Unremarkable.  Musculoskeletal: Full ROM all peripheral extremities, joint stability, 5/5 strength and normal gait.  Skin: Warm, dry without exposed rashes, lesions or ecchymosis apparent.  Neuro: Cranial nerves intact, reflexes equal bilaterally. Sensory-motor testing grossly intact. Tendon reflexes grossly intact.  Pysch: Alert & oriented x 3.  Insight and judgement nl & appropriate. No ideations.  Assessment and Plan:  1. Essential hypertension, benign  - Encouraged bid  BP monitoring and call results to office in 1 week.  - Continue medication, monitor blood pressure at home.  - Continue DASH diet. Reminder to go to the ER if any CP,  SOB, nausea, dizziness, severe HA, changes vision/speech.  - CBC with Differential/Platelet - BASIC METABOLIC PANEL WITH GFR - Magnesium - TSH  2. Hyperlipidemia, mixed  - Continue diet/meds, exercise,& lifestyle modifications.  - Continue monitor periodic cholesterol/liver & renal functions   - Hepatic function panel - Lipid panel - TSH  3. Controlled  type 2 diabetes mellitus with stage 2 chronic kidney disease, without long-term current use of insulin ([place])  - Strongly encouraged stricter diet, exercise, lifestyle modifications.  - Encourage home glucose monitoring.  - Monitor appropriate labs.  - Hemoglobin A1c - Insulin, random  4. Vitamin D deficiency  - Continue supplementation.  - VITAMIN D 25 Hydroxy  5. Flu vaccine need  - Flu vaccine HIGH DOSE PF  6. Medication management  - CBC with Differential/Platelet - BASIC METABOLIC PANEL WITH GFR - Hepatic function panel - Magnesium - Lipid panel - TSH - Hemoglobin A1c - Insulin, random - VITAMIN D 25 Hydroxy       Discussed  regular exercise, BP monitoring, weight control to  achieve/maintain BMI less than 25 and discussed med and SE's. Recommended labs to assess and monitor clinical status with further disposition pending results of labs. Over 30 minutes of exam, counseling, chart review was performed.

## 2017-08-01 NOTE — Patient Instructions (Signed)

## 2017-08-02 ENCOUNTER — Encounter: Payer: Self-pay | Admitting: Internal Medicine

## 2017-08-04 LAB — BASIC METABOLIC PANEL WITH GFR
BUN: 16 mg/dL (ref 7–25)
CALCIUM: 9.7 mg/dL (ref 8.6–10.4)
CO2: 27 mmol/L (ref 20–32)
CREATININE: 0.94 mg/dL (ref 0.50–0.99)
Chloride: 102 mmol/L (ref 98–110)
GFR, EST AFRICAN AMERICAN: 73 mL/min/{1.73_m2} (ref >=60)
GFR, EST NON AFRICAN AMERICAN: 63 mL/min/{1.73_m2} (ref >=60)
Glucose, Bld: 103 mg/dL — ABNORMAL HIGH (ref 65–99)
Potassium: 4.4 mmol/L (ref 3.5–5.3)
Sodium: 138 mmol/L (ref 135–146)

## 2017-08-04 LAB — CBC WITH DIFFERENTIAL/PLATELET
BASOS PCT: 0.3 %
Basophils Absolute: 19 cells/uL (ref 0–200)
EOS ABS: 233 {cells}/uL (ref 15–500)
Eosinophils Relative: 3.7 %
HEMATOCRIT: 41.9 % (ref 35.0–45.0)
Hemoglobin: 14.1 g/dL (ref 11.7–15.5)
LYMPHS ABS: 1789 {cells}/uL (ref 850–3900)
MCH: 28.9 pg (ref 27.0–33.0)
MCHC: 33.7 g/dL (ref 32.0–36.0)
MCV: 85.9 fL (ref 80.0–100.0)
MPV: 9.6 fL (ref 7.5–12.5)
Monocytes Relative: 6.9 %
Neutro Abs: 3824 cells/uL (ref 1500–7800)
Neutrophils Relative %: 60.7 %
Platelets: 177 10*3/uL (ref 140–400)
RBC: 4.88 10*6/uL (ref 3.80–5.10)
RDW: 12.7 % (ref 11.0–15.0)
Total Lymphocyte: 28.4 %
WBC: 6.3 10*3/uL (ref 3.8–10.8)
WBCMIX: 435 {cells}/uL (ref 200–950)

## 2017-08-04 LAB — TSH: TSH: 1.34 m[IU]/L (ref 0.40–4.50)

## 2017-08-04 LAB — LIPID PANEL
CHOL/HDL RATIO: 4.2 (calc) (ref <5.0)
Cholesterol: 160 mg/dL (ref <200)
HDL: 38 mg/dL — ABNORMAL LOW (ref >50)
LDL CHOLESTEROL (CALC): 96 mg/dL
NON-HDL CHOLESTEROL (CALC): 122 mg/dL (ref <130)
TRIGLYCERIDES: 154 mg/dL — AB (ref <150)

## 2017-08-04 LAB — HEMOGLOBIN A1C
EAG (MMOL/L): 8.2 (calc)
Hgb A1c MFr Bld: 6.8 % of total Hgb — ABNORMAL HIGH (ref <5.7)
Mean Plasma Glucose: 148 (calc)

## 2017-08-04 LAB — HEPATIC FUNCTION PANEL
AG RATIO: 1.4 (calc) (ref 1.0–2.5)
ALBUMIN MSPROF: 4.4 g/dL (ref 3.6–5.1)
ALT: 21 U/L (ref 6–29)
AST: 20 U/L (ref 10–35)
Alkaline phosphatase (APISO): 79 U/L (ref 33–130)
BILIRUBIN DIRECT: 0.1 mg/dL (ref 0.0–0.2)
BILIRUBIN TOTAL: 0.5 mg/dL (ref 0.2–1.2)
GLOBULIN: 3.2 g/dL (ref 1.9–3.7)
Indirect Bilirubin: 0.4 mg/dL (calc) (ref 0.2–1.2)
Total Protein: 7.6 g/dL (ref 6.1–8.1)

## 2017-08-04 LAB — INSULIN, RANDOM: INSULIN: 5.3 u[IU]/mL (ref 2.0–19.6)

## 2017-08-04 LAB — VITAMIN D 25 HYDROXY (VIT D DEFICIENCY, FRACTURES): Vit D, 25-Hydroxy: 58 ng/mL (ref 30–100)

## 2017-08-04 LAB — MAGNESIUM: MAGNESIUM: 2.1 mg/dL (ref 1.5–2.5)

## 2017-09-15 ENCOUNTER — Other Ambulatory Visit: Payer: Self-pay | Admitting: Physician Assistant

## 2017-09-15 DIAGNOSIS — G2581 Restless legs syndrome: Secondary | ICD-10-CM

## 2017-10-15 ENCOUNTER — Other Ambulatory Visit: Payer: Self-pay | Admitting: Internal Medicine

## 2017-10-15 MED ORDER — LISINOPRIL 10 MG PO TABS: ORAL_TABLET | ORAL | 1 refills | Status: DC

## 2017-11-16 DIAGNOSIS — E1122 Type 2 diabetes mellitus with diabetic chronic kidney disease: Secondary | ICD-10-CM | POA: Insufficient documentation

## 2017-11-16 DIAGNOSIS — E669 Obesity, unspecified: Secondary | ICD-10-CM | POA: Insufficient documentation

## 2017-11-16 DIAGNOSIS — E663 Overweight: Secondary | ICD-10-CM | POA: Insufficient documentation

## 2017-11-16 DIAGNOSIS — N182 Chronic kidney disease, stage 2 (mild): Secondary | ICD-10-CM

## 2017-11-16 DIAGNOSIS — Z79899 Other long term (current) drug therapy: Secondary | ICD-10-CM | POA: Insufficient documentation

## 2017-11-16 DIAGNOSIS — N1831 Type 2 diabetes mellitus with diabetic chronic kidney disease: Secondary | ICD-10-CM | POA: Insufficient documentation

## 2017-11-16 NOTE — Progress Notes (Signed)
FOLLOW UP  Assessment and Plan:   Hypertension Well controlled with current medications  Monitor blood pressure at home; patient to call if consistently greater than 130/80 Continue DASH diet.   Reminder to go to the ER if any CP, SOB, nausea, dizziness, severe HA, changes vision/speech, left arm numbness and tingling and jaw pain.  Cholesterol Currently near goal but not on statin despite T2DM; discussed initiating low dose per guidelines today - she states she will refuse all statins (husband had severe myalgias) - will work on diet Information for dietary recommendations provided on AVS and reviewed with patient Continue low cholesterol diet and exercise.  Check lipid panel.   Diabetes with diabetic chronic kidney disease Currently treated by lifestyle modification with fair control; initiate metformin for A1C of 7 Continue diet and exercise.  Reminded she needs annual diabetic eye exam and requested report - needs new ophthalmologist - referral placed today Perform daily foot/skin check, notify office of any concerning changes.  Check A1C  Overweight Long discussion about weight loss, diet, and exercise - commended 8 lb weight loss today Recommended diet heavy in fruits and veggies and low in animal meats, cheeses, and dairy products, appropriate calorie intake Discussed ideal weight for height  Patient will work on continuing with dietary changes Will follow up in 3 months  Vitamin D Def At goal at last visit; continue supplementation to maintain goal of 70-100 Defer Vit D level  Continue diet and meds as discussed. Further disposition pending results of labs. Discussed med's effects and SE's.   Over 30 minutes of exam, counseling, chart review, and critical decision making was performed.   Future Appointments  Date Time Provider [place]  03/23/2018  9:30 AM Narda Amber K, DO [facility] None  04/20/2018  3:00 PM Vicie Mutters, PA-C [id number] None     ----------------------------------------------------------------------------------------------------------------------  HPI 67 y.o. female  presents for 3 month follow up on hypertension, cholesterol, T2 diabetes with stage 2 CKD, weight and vitamin D deficiency. She reports some mild abdominal cramping with 2 episodes of watery diarrhea since yesterday; she is of the opinion that this was related to something she ate. Denies fever/chills, blood or mucus in stool. Denies nausea/vomiting.   BMI is Body mass index is 27.62 kg/m., she has been working on diet but not exercise. Wt Readings from Last 3 Encounters:  11/17/17 166 lb (75.3 kg)  08/01/17 174 lb (78.9 kg)  04/15/17 171 lb (77.6 kg)   She does not check her blood pressure. Today their BP is BP: 136/72 She reports she did not take her BP medication today due to diarrhea and concerns about becoming hypotensive.   She does not workout. She denies chest pain, shortness of breath, dizziness.   She is not on cholesterol medication and denies myalgias. She refuses all statin therapy.  Her cholesterol is not at goal. The cholesterol last visit was:   Lab Results  Component Value Date   CHOL 160 08/01/2017   HDL 38 (L) 08/01/2017   LDLCALC 103 (H) 04/15/2017   TRIG 154 (H) 08/01/2017   CHOLHDL 4.2 08/01/2017    She has been working on diet for T2 diabetes currently treated by lifestyle only, and denies foot ulcerations, increased appetite, nausea, polydipsia, polyuria, visual disturbances, vomiting and weight loss. She does not check blood glucose. Last A1C in the office was:  Lab Results  Component Value Date   HGBA1C 6.8 (H) 08/01/2017   Patient is on Vitamin D supplement and approaching  goal of 70 at last check:    Lab Results  Component Value Date   VD25OH 58 08/01/2017       Current Medications:  Current Outpatient Medications on File Prior to Visit  Medication Sig  . aspirin EC 81 MG tablet Take 81 mg by mouth daily.   Marland Kitchen CRANBERRY EXTRACT PO Take 1 tablet by mouth daily.    Marland Kitchen ibuprofen (ADVIL,MOTRIN) 600 MG tablet Take 600 mg by mouth every morning.  Marland Kitchen lisinopril (PRINIVIL,ZESTRIL) 10 MG tablet Take 1 tablet daily for BP  . Multiple Vitamins-Minerals (MULTIVITAMIN PO) Take by mouth daily.  . Phenazopyridine HCl (AZO TABS PO) Take 2 tablets by mouth daily.  Marland Kitchen rOPINIRole (REQUIP) 0.25 MG tablet Take 1 tablet at 11am x 1 week, then increase to 2 tablets.  Marland Kitchen VITAMIN D, CHOLECALCIFEROL, PO Take 4,000 Units by mouth daily.   . cyclobenzaprine (FLEXERIL) 10 MG tablet Take 10 mg by mouth at bedtime.   Marland Kitchen rOPINIRole (REQUIP) 1 MG tablet TAKE 1 TABLET(1 MG) BY MOUTH THREE TIMES DAILY (Patient not taking: Reported on 11/17/2017)   No current facility-administered medications on file prior to visit.      Allergies:  Allergies  Allergen Reactions  . Hctz [Hydrochlorothiazide]     Leg cramping  . Metformin And Related Nausea Only     Medical History:  Past Medical History:  Diagnosis Date  . Allergy    otc med prn  . Arthritis    hips, lower back  . Cataract    bilateral  . Chronic kidney disease    stage 3 per patient   . Diet-controlled type 2 diabetes mellitus (HCC)    Diet controlled, no meds  . Diverticulosis   . Dribbling urine   . EKG abnormalities    pt states she had a BBB on her last EKG, no problems  . Essential hypertension, benign 08/19/2013  . HPV in female   . Hyperlipidemia    diet controlled, no med  . Restless leg syndrome   . Snoring   . Vitamin D deficiency    Family history- Reviewed and unchanged Social history- Reviewed and unchanged   Review of Systems:  Review of Systems  Constitutional: Negative for malaise/fatigue and weight loss.  HENT: Negative for hearing loss and tinnitus.   Eyes: Negative for blurred vision and double vision.  Respiratory: Negative for cough, shortness of breath and wheezing.   Cardiovascular: Negative for chest pain, palpitations, orthopnea,  claudication and leg swelling.  Gastrointestinal: Positive for diarrhea (2 episodes last night). Negative for abdominal pain, blood in stool, constipation, heartburn, melena, nausea and vomiting.  Genitourinary: Negative.   Musculoskeletal: Positive for back pain (Chronic). Negative for joint pain and myalgias.  Skin: Negative for rash.  Neurological: Negative for dizziness, tingling, sensory change, weakness and headaches.  Endo/Heme/Allergies: Negative for polydipsia.  Psychiatric/Behavioral: Negative.  Negative for depression and memory loss. The patient is not nervous/anxious and does not have insomnia.   All other systems reviewed and are negative.    Physical Exam: BP 136/72   Pulse 69   Temp 97.7 F (36.5 C)   Ht 5\' 5"  (1.651 m)   Wt 166 lb (75.3 kg)   SpO2 97%   BMI 27.62 kg/m  Wt Readings from Last 3 Encounters:  11/17/17 166 lb (75.3 kg)  08/01/17 174 lb (78.9 kg)  04/15/17 171 lb (77.6 kg)   General Appearance: Well nourished, in no apparent distress. Eyes: PERRLA, EOMs, conjunctiva no swelling  or erythema Sinuses: No Frontal/maxillary tenderness ENT/Mouth: Ext aud canals clear, TMs without erythema, bulging. No erythema, swelling, or exudate on post pharynx.  Tonsils not swollen or erythematous. Hearing normal.  Neck: Supple, thyroid normal.  Respiratory: Respiratory effort normal, BS equal bilaterally without rales, rhonchi, wheezing or stridor.  Cardio: RRR with no MRGs. Brisk peripheral pulses without edema.  Abdomen: Soft, + BS.  Non tender, no guarding, rebound, hernias, masses. Lymphatics: Non tender without lymphadenopathy.  Musculoskeletal: Full ROM, 5/5 strength, Normal gait Skin: Warm, dry without rashes, lesions, ecchymosis.  Neuro: Cranial nerves intact. No cerebellar symptoms.  Psych: Awake and oriented X 3, normal affect, Insight and Judgment appropriate.    Izora Ribas, NP 9:45 AM [facility]

## 2017-11-17 ENCOUNTER — Ambulatory Visit (INDEPENDENT_AMBULATORY_CARE_PROVIDER_SITE_OTHER): Payer: Medicare Other | Admitting: Adult Health

## 2017-11-17 ENCOUNTER — Encounter: Payer: Self-pay | Admitting: Adult Health

## 2017-11-17 VITALS — BP 136/72 | HR 69 | Temp 97.7°F | Ht 65.0 in | Wt 166.0 lb

## 2017-11-17 DIAGNOSIS — E1122 Type 2 diabetes mellitus with diabetic chronic kidney disease: Secondary | ICD-10-CM | POA: Diagnosis not present

## 2017-11-17 DIAGNOSIS — Z79899 Other long term (current) drug therapy: Secondary | ICD-10-CM

## 2017-11-17 DIAGNOSIS — E663 Overweight: Secondary | ICD-10-CM

## 2017-11-17 DIAGNOSIS — I10 Essential (primary) hypertension: Secondary | ICD-10-CM | POA: Diagnosis not present

## 2017-11-17 DIAGNOSIS — E782 Mixed hyperlipidemia: Secondary | ICD-10-CM

## 2017-11-17 DIAGNOSIS — E559 Vitamin D deficiency, unspecified: Secondary | ICD-10-CM | POA: Diagnosis not present

## 2017-11-17 DIAGNOSIS — N182 Chronic kidney disease, stage 2 (mild): Secondary | ICD-10-CM

## 2017-11-17 MED ORDER — CYCLOBENZAPRINE HCL 10 MG PO TABS: 10.0000 mg | ORAL_TABLET | Freq: Three times a day (TID) | ORAL | 1 refills | Status: DC | PRN

## 2017-11-17 NOTE — Patient Instructions (Signed)
Fat and Cholesterol Restricted Diet Getting too much fat and cholesterol in your diet may cause health problems. Following this diet helps keep your fat and cholesterol at normal levels. This can keep you from getting sick. What types of fat should I choose?  Choose monosaturated and polyunsaturated fats. These are found in foods such as olive oil, canola oil, flaxseeds, walnuts, almonds, and seeds.  Eat more omega-3 fats. Good choices include salmon, mackerel, sardines, tuna, flaxseed oil, and ground flaxseeds.  Limit saturated fats. These are in animal products such as meats, butter, and cream. They can also be in plant products such as palm oil, palm kernel oil, and coconut oil.  Avoid foods with partially hydrogenated oils in them. These contain trans fats. Examples of foods that have trans fats are stick margarine, some tub margarines, cookies, crackers, and other baked goods. What general guidelines do I need to follow?  Check food labels. Look for the words "trans fat" and "saturated fat."  When preparing a meal: ? Fill half of your plate with vegetables and green salads. ? Fill one fourth of your plate with whole grains. Look for the word "whole" as the first word in the ingredient list. ? Fill one fourth of your plate with lean protein foods.  Eat more foods that have fiber, like apples, carrots, beans, peas, and barley.  Eat more home-cooked foods. Eat less at restaurants and buffets.  Limit or avoid alcohol.  Limit foods high in starch and sugar.  Limit fried foods.  Cook foods without frying them. Baking, boiling, grilling, and broiling are all great options.  Lose weight if you are overweight. Losing even a small amount of weight can help your overall health. It can also help prevent diseases such as diabetes and heart disease. What foods can I eat? Grains Whole grains, such as whole wheat or whole grain breads, crackers, cereals, and pasta. Unsweetened oatmeal,  bulgur, barley, quinoa, or brown rice. Corn or whole wheat flour tortillas. Vegetables Fresh or frozen vegetables (raw, steamed, roasted, or grilled). Green salads. Fruits All fresh, canned (in natural juice), or frozen fruits. Meat and Other Protein Products Ground beef (85% or leaner), grass-fed beef, or beef trimmed of fat. Skinless chicken or turkey. Ground chicken or turkey. Pork trimmed of fat. All fish and seafood. Eggs. Dried beans, peas, or lentils. Unsalted nuts or seeds. Unsalted canned or dry beans. Dairy Low-fat dairy products, such as skim or 1% milk, 2% or reduced-fat cheeses, low-fat ricotta or cottage cheese, or plain low-fat yogurt. Fats and Oils Tub margarines without trans fats. Light or reduced-fat mayonnaise and salad dressings. Avocado. Olive, canola, sesame, or safflower oils. Natural peanut or almond butter (choose ones without added sugar and oil). The items listed above may not be a complete list of recommended foods or beverages. Contact your dietitian for more options. What foods are not recommended? Grains White bread. White pasta. White rice. Cornbread. Bagels, pastries, and croissants. Crackers that contain trans fat. Vegetables White potatoes. Corn. Creamed or fried vegetables. Vegetables in a cheese sauce. Fruits Dried fruits. Canned fruit in light or heavy syrup. Fruit juice. Meat and Other Protein Products Fatty cuts of meat. Ribs, chicken wings, bacon, sausage, bologna, salami, chitterlings, fatback, hot dogs, bratwurst, and packaged luncheon meats. Liver and organ meats. Dairy Whole or 2% milk, cream, half-and-half, and cream cheese. Whole milk cheeses. Whole-fat or sweetened yogurt. Full-fat cheeses. Nondairy creamers and whipped toppings. Processed cheese, cheese spreads, or cheese curds. Sweets and Desserts Corn   syrup, sugars, honey, and molasses. Candy. Jam and jelly. Syrup. Sweetened cereals. Cookies, pies, cakes, donuts, muffins, and ice  cream. Fats and Oils Butter, stick margarine, lard, shortening, ghee, or bacon fat. Coconut, palm kernel, or palm oils. Beverages Alcohol. Sweetened drinks (such as sodas, lemonade, and fruit drinks or punches). The items listed above may not be a complete list of foods and beverages to avoid. Contact your dietitian for more information. This information is not intended to replace advice given to you by your health care provider. Make sure you discuss any questions you have with your health care provider. Document Released: 03/31/2012 Document Revised: 06/06/2016 Document Reviewed: 12/30/2013 [employer] Interactive Patient Education  2018 [employer]  

## 2017-11-18 LAB — LIPID PANEL
CHOL/HDL RATIO: 4.4 (calc) (ref <5.0)
CHOLESTEROL: 163 mg/dL (ref <200)
HDL: 37 mg/dL — ABNORMAL LOW (ref >50)
LDL Cholesterol (Calc): 101 mg/dL (calc) — ABNORMAL HIGH
Non-HDL Cholesterol (Calc): 126 mg/dL (calc) (ref <130)
Triglycerides: 150 mg/dL — ABNORMAL HIGH (ref <150)

## 2017-11-18 LAB — BASIC METABOLIC PANEL WITH GFR
BUN / CREAT RATIO: 17 (calc) (ref 6–22)
BUN: 20 mg/dL (ref 7–25)
CO2: 28 mmol/L (ref 20–32)
Calcium: 9.6 mg/dL (ref 8.6–10.4)
Chloride: 106 mmol/L (ref 98–110)
Creat: 1.18 mg/dL — ABNORMAL HIGH (ref 0.50–0.99)
GFR, EST AFRICAN AMERICAN: 55 mL/min/{1.73_m2} — AB (ref >=60)
GFR, EST NON AFRICAN AMERICAN: 48 mL/min/{1.73_m2} — AB (ref >=60)
Glucose, Bld: 91 mg/dL (ref 65–99)
POTASSIUM: 4.4 mmol/L (ref 3.5–5.3)
SODIUM: 140 mmol/L (ref 135–146)

## 2017-11-18 LAB — CBC WITH DIFFERENTIAL/PLATELET
BASOS ABS: 31 {cells}/uL (ref 0–200)
Basophils Relative: 0.5 %
EOS ABS: 329 {cells}/uL (ref 15–500)
Eosinophils Relative: 5.3 %
HCT: 41.5 % (ref 35.0–45.0)
Hemoglobin: 13.8 g/dL (ref 11.7–15.5)
Lymphs Abs: 1984 cells/uL (ref 850–3900)
MCH: 28.8 pg (ref 27.0–33.0)
MCHC: 33.3 g/dL (ref 32.0–36.0)
MCV: 86.6 fL (ref 80.0–100.0)
MPV: 10 fL (ref 7.5–12.5)
Monocytes Relative: 6.6 %
NEUTROS PCT: 55.6 %
Neutro Abs: 3447 cells/uL (ref 1500–7800)
PLATELETS: 192 10*3/uL (ref 140–400)
RBC: 4.79 10*6/uL (ref 3.80–5.10)
RDW: 12.9 % (ref 11.0–15.0)
TOTAL LYMPHOCYTE: 32 %
WBC: 6.2 10*3/uL (ref 3.8–10.8)
WBCMIX: 409 {cells}/uL (ref 200–950)

## 2017-11-18 LAB — HEPATIC FUNCTION PANEL
AG Ratio: 1.3 (calc) (ref 1.0–2.5)
ALT: 17 U/L (ref 6–29)
AST: 20 U/L (ref 10–35)
Albumin: 4 g/dL (ref 3.6–5.1)
Alkaline phosphatase (APISO): 65 U/L (ref 33–130)
BILIRUBIN DIRECT: 0.1 mg/dL (ref 0.0–0.2)
BILIRUBIN INDIRECT: 0.3 mg/dL (ref 0.2–1.2)
BILIRUBIN TOTAL: 0.4 mg/dL (ref 0.2–1.2)
Globulin: 3 g/dL (calc) (ref 1.9–3.7)
Total Protein: 7 g/dL (ref 6.1–8.1)

## 2017-11-18 LAB — HEMOGLOBIN A1C
HEMOGLOBIN A1C: 6.9 %{Hb} — AB (ref <5.7)
MEAN PLASMA GLUCOSE: 151 (calc)
eAG (mmol/L): 8.4 (calc)

## 2017-11-18 LAB — TSH: TSH: 1.15 m[IU]/L (ref 0.40–4.50)

## 2017-11-21 ENCOUNTER — Other Ambulatory Visit: Payer: Self-pay | Admitting: Neurology

## 2017-11-26 NOTE — Progress Notes (Signed)
[facility] Note  11/27/2017     CHIEF COMPLAINT Patient presents for Diabetic Eye Exam   HISTORY OF PRESENT ILLNESS: Rachel Daniel is a 67 y.o. female who presents to the clinic today for:   HPI    Diabetic Eye Exam    Associated Symptoms Negative for Flashes, Blind Spot, Photophobia, Scalp Tenderness, Fever, Floaters, Pain, Glare, Jaw Claudication, Weight Loss, Distortion, Redness, Trauma (pt ), Shoulder/Hip pain and Fatigue.  Diabetes characteristics include Type 2 and controlled with diet.  This started 20 years ago.  Blood sugar level fluctuates.  Last Blood Glucose: Pt does not check at home.  Last A1C 6.7.  I, the attending physician,  performed the HPI with the patient and updated documentation appropriately.          Comments    Pt present today on the referral of Dr. Liane Comber for a DM exam, pt A1C was 6.7 6 months ago, pt saw her Dr 2-3 weeks ago, but they did not give her A1C results to her, but said it's trending upwards, pt does not check BS at home, she is a Type 2 diabetic, dx 20-something years ago, pt denies flashes, pain or wavy vision, but does have 1 floater OS, pt uses OTC AT PRN, pt takes a MV daily       Last edited by Bernarda Caffey, MD on 11/27/2017 10:40 AM. (History)    Pt states she does not have a "regular eye doctor"; Pt states she was told to come be checked due to having diabetes; Pt states she does have a single floater OS;   Referring physician: Liane Comber, NP [address] [place], [place] [address]  HISTORICAL INFORMATION:   Selected notes from the MEDICAL RECORD NUMBER Referred by A. Corbett, NP (internal med) for DM exam;   LEE- unknown - saw Dr. Zigmund Daniel on 08.07.17, BCVA at that time OD: 20/20 OS: 20/20 Ocular Hx- cataract OU PMH- DM, arthritis, CKD, HTN    CURRENT MEDICATIONS: Current Outpatient Medications (Ophthalmic Drugs)  Medication Sig  . Hydroxyamphetamine-Tropicamide  (PAREMYD OP) Apply to eye.   No current facility-administered medications for this visit.  (Ophthalmic Drugs)   Current Outpatient Medications (Other)  Medication Sig  . aspirin EC 81 MG tablet Take 81 mg by mouth daily.  Marland Kitchen CRANBERRY EXTRACT PO Take 1 tablet by mouth daily.    . cyclobenzaprine (FLEXERIL) 10 MG tablet Take 1 tablet (10 mg total) by mouth 3 (three) times daily as needed for muscle spasms. Take 1/2-1 tab  . ibuprofen (ADVIL,MOTRIN) 600 MG tablet Take 600 mg by mouth every morning.  Marland Kitchen lisinopril (PRINIVIL,ZESTRIL) 10 MG tablet Take 1 tablet daily for BP  . Multiple Vitamins-Minerals (MULTIVITAMIN PO) Take by mouth daily.  . Phenazopyridine HCl (AZO TABS PO) Take 2 tablets by mouth daily.  Marland Kitchen rOPINIRole (REQUIP) 0.25 MG tablet TAKE 1 TABLET BY MOUTH AT 11 AM FOR 1 WEEK. INCREASE TO 2 TABLETS  . VITAMIN D, CHOLECALCIFEROL, PO Take 4,000 Units by mouth daily.   Marland Kitchen rOPINIRole (REQUIP) 1 MG tablet TAKE 1 TABLET(1 MG) BY MOUTH THREE TIMES DAILY (Patient not taking: Reported on 11/17/2017)   No current facility-administered medications for this visit.  (Other)      REVIEW OF SYSTEMS: ROS    Positive for: Endocrine, Cardiovascular, Eyes   Negative for: Constitutional, Gastrointestinal, Neurological, Skin, Genitourinary, Musculoskeletal, HENT, Respiratory, Psychiatric, Allergic/Imm, Heme/Lymph   Last edited by Debbrah Alar,  COT on 11/27/2017  9:11 AM. (History)       ALLERGIES Allergies  Allergen Reactions  . Hctz [Hydrochlorothiazide]     Leg cramping  . Metformin And Related Nausea Only  . Phenylbutazones Swelling  . Tropicamide Swelling    Red /itching/watery eyes    PAST MEDICAL HISTORY Past Medical History:  Diagnosis Date  . Allergy    otc med prn  . Arthritis    hips, lower back  . Cataract    bilateral  . Chronic kidney disease    stage 3 per patient   . Diet-controlled type 2 diabetes mellitus (HCC)    Diet controlled, no meds  . Diverticulosis   .  Dribbling urine   . EKG abnormalities    pt states she had a BBB on her last EKG, no problems  . Essential hypertension, benign 08/19/2013  . HPV in female   . Hyperlipidemia    diet controlled, no med  . Restless leg syndrome   . Snoring   . Vitamin D deficiency    Past Surgical History:  Procedure Laterality Date  . BREAST BIOPSY Left 1969   left lumpectomy - benign  . COLONOSCOPY  2012   kaplan-polyps, tics  . LUMBAR LAMINECTOMY/DECOMPRESSION MICRODISCECTOMY  05/07/2012   Procedure: LUMBAR LAMINECTOMY/DECOMPRESSION MICRODISCECTOMY;  Surgeon: Sinclair Ship, MD;  Location: [facility];  Service: Orthopedics;  Laterality: Bilateral;  Lumbar 4-5 decompression  . OVARIAN CYST REMOVAL  1981   right  . TUBAL LIGATION  1981  . VAGINAL HYSTERECTOMY  1992    FAMILY HISTORY Family History  Problem Relation Age of Onset  . Hypertension Mother   . Diabetes Mother   . Stroke Mother   . Heart failure Mother   . Colon cancer Mother 43  . Cataracts Mother   . Heart failure Father 37  . Stroke Sister 52  . Colon polyps Sister   . Colon polyps Brother   . Stomach cancer Neg Hx   . Esophageal cancer Neg Hx   . Rectal cancer Neg Hx   . Amblyopia Neg Hx   . Blindness Neg Hx   . Glaucoma Neg Hx   . Macular degeneration Neg Hx   . Retinal detachment Neg Hx   . Strabismus Neg Hx   . Retinitis pigmentosa Neg Hx     SOCIAL HISTORY Social History   Tobacco Use  . Smoking status: Passive Smoke Exposure - Never Smoker  . Smokeless tobacco: Never Used  Substance Use Topics  . Alcohol use: No    Alcohol/week: 0.0 oz  . Drug use: No         OPHTHALMIC EXAM:  Base Eye Exam    Visual Acuity (Snellen - Linear)      Right Left   Dist cc 20/20 20/25   Dist ph cc  20/25 +2   Correction:  Glasses       Tonometry (Tonopen, 9:33 AM)      Right Left   Pressure 12 13       Pupils      Dark Light Shape React APD   Right 3 2 Round Brisk None   Left 3 2 Round Brisk None        Visual Fields (Counting fingers)      Left Right    Full Full       Extraocular Movement      Right Left    Full, Ortho Full, Ortho  Neuro/Psych    Oriented x3:  Yes   Mood/Affect:  Normal       Dilation    Both eyes:  Paremyd @ 9:33 AM        Slit Lamp and Fundus Exam    Slit Lamp Exam      Right Left   Lids/Lashes Dermatochalasis - upper lid, mild Telangiectasia UL Dermatochalasis - upper lid, mild Telangiectasia UL   Conjunctiva/Sclera Inferior Cysts White and quiet   Cornea Arcus, patch of 2+ Punctate epithelial erosions at 0900 Arcus, other wise clear   Anterior Chamber Deep, Temporal Narrow angle Deep, Temporal Narrow angle   Iris Round and dilated, No NVI Round and dilated, No NVI   Lens 2+ Nuclear sclerosis, 2+ Cortical cataract 2+ Nuclear sclerosis, 3+ Cortical cataract   Vitreous Vitreous syneresis Vitreous syneresis, Posterior vitreous detachment       Fundus Exam      Right Left   Disc Mild Peripapillary pigmentation and atrophy, No NVD 360 Peripapillary atrophy with pigmnetation   C/D Ratio 0.3 0.2   Macula Retinal pigment epithelial mottling, No heme or edema Flat, No heme or edema   Vessels Vascular attenuation Vascular attenuation   Periphery Attached Attached        Refraction    Wearing Rx      Sphere Cylinder Axis   Right -2.25 +0.50 018   Left -2.00 +0.50 067       Manifest Refraction      Sphere Cylinder Axis Dist VA   Right -2.25 +0.50 018 20/20   Left -2.25 +0.50 067 20/20          IMAGING AND PROCEDURES  Imaging and Procedures for 11/27/17  OCT, Retina - OU - Both Eyes     Right Eye Quality was good. Central Foveal Thickness: 224. Progression has been stable. Findings include normal foveal contour, no IRF, no SRF.   Left Eye Quality was good. Central Foveal Thickness: 234. Progression has been stable. Findings include normal foveal contour, no IRF, no SRF.   Notes *Images captured and stored on drive  Diagnosis /  Impression:  No DME OU  Clinical management:  See below  Abbreviations: NFP - Normal foveal profile. CME - cystoid macular edema. PED - pigment epithelial detachment. IRF - intraretinal fluid. SRF - subretinal fluid. EZ - ellipsoid zone. ERM - epiretinal membrane. ORA - outer retinal atrophy. ORT - outer retinal tubulation. SRHM - subretinal hyper-reflective material                  ASSESSMENT/PLAN:    ICD-10-CM   1. Diabetes mellitus type 2 without retinopathy ([place]) E11.9   2. Posterior vitreous detachment of left eye H43.812   3. Retinal edema H35.81 OCT, Retina - OU - Both Eyes  4. Combined forms of age-related cataract of both eyes H25.813     1. Diabetes mellitus, type 2 without retinopathy - The incidence, risk factors for progression, natural history and treatment options for diabetic retinopathy  were discussed with patient.   - The need for close monitoring of blood glucose, blood pressure, and serum lipids, avoiding cigarette or any type of tobacco, and the need for long term follow up was also discussed with patient. - f/u in 1-2 years, sooner prn  2. PVD OS  Long-standing, floater OS -- unchanged  Discussed findings and prognosis  No RT or RD on 360 peripheral exam  Reviewed s/s of RT/RD  Strict return precautions for any such  RT/RD signs/symptoms  3. No retinal edema on exam or OCT  4. Combined form age-related cataract OU-  - The symptoms of cataract, surgical options, and treatments and risks were discussed with patient. - discussed diagnosis and progression - not yet visually significant - monitor for now   Ophthalmic Meds Ordered this visit:  No orders of the defined types were placed in this encounter.      Return in about 1 year (around 11/27/2018) for DM exam, Dilated exam, OCT.  There are no Patient Instructions on file for this visit.   Explained the diagnoses, plan, and follow up with the patient and they expressed understanding.   Patient expressed understanding of the importance of proper follow up care.   This document serves as a record of services personally performed by Gardiner Sleeper, MD, PhD. It was created on their behalf by Catha Brow, [place], a certified ophthalmic assistant. The creation of this record is the provider's dictation and/or activities during the visit.  Electronically signed by: Catha Brow, [place]  11/27/17 11:46 PM   Gardiner Sleeper, M.D., Ph.D. Diseases & Surgery of the Retina and [place] 11/27/17   I have reviewed the above documentation for accuracy and completeness, and I agree with the above. Gardiner Sleeper, M.D., Ph.D. 11/27/17 11:46 PM     Abbreviations: M myopia (nearsighted); A astigmatism; H hyperopia (farsighted); P presbyopia; Mrx spectacle prescription;  CTL contact lenses; OD right eye; OS left eye; OU both eyes  XT exotropia; ET esotropia; PEK punctate epithelial keratitis; PEE punctate epithelial erosions; DES dry eye syndrome; MGD meibomian gland dysfunction; ATs artificial tears; PFAT's preservative free artificial tears; [place] nuclear sclerotic cataract; PSC posterior subcapsular cataract; ERM epi-retinal membrane; PVD posterior vitreous detachment; RD retinal detachment; DM diabetes mellitus; DR diabetic retinopathy; NPDR non-proliferative diabetic retinopathy; PDR proliferative diabetic retinopathy; CSME clinically significant macular edema; DME diabetic macular edema; dbh dot blot hemorrhages; CWS cotton wool spot; POAG primary open angle glaucoma; C/D cup-to-disc ratio; HVF humphrey visual field; GVF goldmann visual field; OCT optical coherence tomography; IOP intraocular pressure; BRVO Branch retinal vein occlusion; CRVO central retinal vein occlusion; CRAO central retinal artery occlusion; BRAO branch retinal artery occlusion; RT retinal tear; SB scleral buckle; PPV pars plana vitrectomy; VH Vitreous hemorrhage; PRP panretinal laser  photocoagulation; IVK intravitreal kenalog; VMT vitreomacular traction; MH Macular hole;  NVD neovascularization of the disc; NVE neovascularization elsewhere; AREDS age related eye disease study; ARMD age related macular degeneration; POAG primary open angle glaucoma; EBMD epithelial/anterior basement membrane dystrophy; ACIOL anterior chamber intraocular lens; IOL intraocular lens; PCIOL posterior chamber intraocular lens; Phaco/IOL phacoemulsification with intraocular lens placement; [place] photorefractive keratectomy; LASIK laser assisted in situ keratomileusis; HTN hypertension; DM diabetes mellitus; COPD chronic obstructive pulmonary disease

## 2017-11-27 ENCOUNTER — Encounter (INDEPENDENT_AMBULATORY_CARE_PROVIDER_SITE_OTHER): Payer: Self-pay | Admitting: Ophthalmology

## 2017-11-27 ENCOUNTER — Ambulatory Visit (INDEPENDENT_AMBULATORY_CARE_PROVIDER_SITE_OTHER): Payer: Medicare Other | Admitting: Ophthalmology

## 2017-11-27 DIAGNOSIS — H43812 Vitreous degeneration, left eye: Secondary | ICD-10-CM

## 2017-11-27 DIAGNOSIS — H3581 Retinal edema: Secondary | ICD-10-CM | POA: Diagnosis not present

## 2017-11-27 DIAGNOSIS — H25813 Combined forms of age-related cataract, bilateral: Secondary | ICD-10-CM | POA: Diagnosis not present

## 2017-11-27 DIAGNOSIS — E119 Type 2 diabetes mellitus without complications: Secondary | ICD-10-CM

## 2017-11-27 LAB — HM DIABETES EYE EXAM

## 2017-11-28 ENCOUNTER — Encounter: Payer: Self-pay | Admitting: *Deleted

## 2017-12-11 ENCOUNTER — Other Ambulatory Visit: Payer: Self-pay | Admitting: Adult Health

## 2017-12-11 DIAGNOSIS — G2581 Restless legs syndrome: Secondary | ICD-10-CM

## 2017-12-25 DIAGNOSIS — L719 Rosacea, unspecified: Secondary | ICD-10-CM | POA: Diagnosis not present

## 2017-12-25 DIAGNOSIS — E1369 Other specified diabetes mellitus with other specified complication: Secondary | ICD-10-CM | POA: Diagnosis not present

## 2017-12-25 DIAGNOSIS — R21 Rash and other nonspecific skin eruption: Secondary | ICD-10-CM | POA: Diagnosis not present

## 2018-02-16 ENCOUNTER — Ambulatory Visit (INDEPENDENT_AMBULATORY_CARE_PROVIDER_SITE_OTHER): Payer: Medicare Other | Admitting: Physician Assistant

## 2018-02-16 ENCOUNTER — Encounter: Payer: Self-pay | Admitting: Physician Assistant

## 2018-02-16 VITALS — BP 126/74 | HR 65 | Temp 97.5°F | Resp 14 | Ht 65.0 in | Wt 168.0 lb

## 2018-02-16 DIAGNOSIS — I10 Essential (primary) hypertension: Secondary | ICD-10-CM | POA: Diagnosis not present

## 2018-02-16 DIAGNOSIS — N182 Chronic kidney disease, stage 2 (mild): Secondary | ICD-10-CM | POA: Diagnosis not present

## 2018-02-16 DIAGNOSIS — E1122 Type 2 diabetes mellitus with diabetic chronic kidney disease: Secondary | ICD-10-CM | POA: Diagnosis not present

## 2018-02-16 DIAGNOSIS — E782 Mixed hyperlipidemia: Secondary | ICD-10-CM

## 2018-02-16 DIAGNOSIS — E1142 Type 2 diabetes mellitus with diabetic polyneuropathy: Secondary | ICD-10-CM | POA: Diagnosis not present

## 2018-02-16 DIAGNOSIS — Z79899 Other long term (current) drug therapy: Secondary | ICD-10-CM

## 2018-02-16 NOTE — Patient Instructions (Addendum)
Magnesium low add 500 mg.  Magnesium may help with muscle cramps, constipation, vitamin D and potassium absorption.   Check out  Mini habits for weight loss book  2 apps for tracking food is myfitness pal  loseit OR can take picture of your food  Being dehydrated can hurt your kidneys, cause fatigue, headaches, muscle aches, joint pain, and dry skin/nails so please increase your fluids.   Drink 80-100 oz a day of water, measure it out!   About Constipation  Constipation Overview Constipation is the most common gastrointestinal complaint - about 4 million Americans experience constipation and make 2.5 million physician visits a year to get help for the problem.  Constipation can occur when the colon absorbs too much water, the colon's muscle contraction is slow or sluggish, and/or there is delayed transit time through the colon.  The result is stool that is hard and dry.  Indicators of constipation include straining during bowel movements greater than 25% of the time, having fewer than three bowel movements per week, and/or the feeling of incomplete evacuation.  There are established guidelines ([place] II ) for defining constipation. A person needs to have two or more of the following symptoms for at least 12 weeks (not necessarily consecutive) in the preceding 12 months: . Straining in  greater than 25% of bowel movements . Lumpy or hard stools in greater than 25% of bowel movements . Sensation of incomplete emptying in greater than 25% of bowel movements . Sensation of anorectal obstruction/blockade in greater than 25% of bowel movements . Manual maneuvers to help empty greater than 25% of bowel movements (e.g., digital evacuation, support of the pelvic floor)  . Less than  3 bowel movements/week . Loose stools are not present, and criteria for irritable bowel syndrome are insufficient  Common Causes of Constipation . Lack of fiber in your diet . Lack of physical activity . Medications,  including iron and calcium supplements  . Dairy intake . Dehydration . Abuse of laxatives  Travel  Irritable Bowel Syndrome  Pregnancy  Luteal phase of menstruation (after ovulation and before menses)  Colorectal problems  Intestinal Dysfunction  Treating Constipation  There are several ways of treating constipation, including changes to diet and exercise, use of laxatives, adjustments to the pelvic floor, and scheduled toileting.  These treatments include: . increasing fiber and fluids in the diet  . increasing physical activity . learning muscle coordination   learning proper toileting techniques and toileting modifications   designing and sticking  to a toileting schedule     2007, [employer] Doc.22

## 2018-02-16 NOTE — Progress Notes (Signed)
FOLLOW UP  Assessment and Plan:   Hypertension Well controlled with current medications  Monitor blood pressure at home; patient to call if consistently greater than 130/80 Continue DASH diet.   Reminder to go to the ER if any CP, SOB, nausea, dizziness, severe HA, changes vision/speech, left arm numbness and tingling and jaw pain.  Cholesterol Currently near goal but not on statin despite T2DM; discussed initiating low dose per guidelines today - she states she will refuse all statins (husband had severe myalgias) - will work on diet Information for dietary recommendations provided on AVS and reviewed with patient Continue low cholesterol diet and exercise.  Check lipid panel.   Diabetes with diabetic chronic kidney disease Currently treated by lifestyle modification with fair control; initiate metformin for A1C of 7 Continue diet and exercise.  Reminded she needs annual diabetic eye exam and requested report - needs new ophthalmologist - referral placed today Perform daily foot/skin check, notify office of any concerning changes.  Check A1C  Overweight Long discussion about weight loss, diet, and exercise - commended 8 lb weight loss today Recommended diet heavy in fruits and veggies and low in animal meats, cheeses, and dairy products, appropriate calorie intake Discussed ideal weight for height  Patient will work on continuing with dietary changes Will follow up in 3 months  Medication management Check mag, add on for constipation, increase water and exercise  Continue diet and meds as discussed. Further disposition pending results of labs. Discussed med's effects and SE's.   Over 30 minutes of exam, counseling, chart review, and critical decision making was performed.   Future Appointments  Date Time Provider [place]  03/23/2018  9:30 AM Narda Amber K, DO [facility] None  06/09/2018 10:30 AM Vicie Mutters, PA-C [id number] None     ----------------------------------------------------------------------------------------------------------------------  HPI 67 y.o. female  presents for 3 month follow up on hypertension, cholesterol, T2 diabetes with stage 2 CKD, weight and vitamin D deficiency.   BMI is Body mass index is 27.96 kg/m., she has been working on diet but not exercise. Wt Readings from Last 3 Encounters:  02/16/18 168 lb (76.2 kg)  11/17/17 166 lb (75.3 kg)  08/01/17 174 lb (78.9 kg)    Today their BP is BP: 126/74   She does not workout. She denies chest pain, shortness of breath, dizziness.   She is not on cholesterol medication and denies myalgias. She refuses all statin therapy.  Her cholesterol is not at goal. The cholesterol last visit was:   Lab Results  Component Value Date   CHOL 163 11/17/2017   HDL 37 (L) 11/17/2017   LDLCALC 101 (H) 11/17/2017   TRIG 150 (H) 11/17/2017   CHOLHDL 4.4 11/17/2017    She has been working on diet for T2 diabetes currently treated by lifestyle only, eye exam 11/27/2017, and denies foot ulcerations, increased appetite, nausea, polydipsia, polyuria, visual disturbances, vomiting and weight loss. She does not check blood glucose. Last A1C in the office was:  Lab Results  Component Value Date   HGBA1C 6.9 (H) 11/17/2017  :    Lab Results  Component Value Date   VD25OH 58 08/01/2017       Current Medications:  Current Outpatient Medications on File Prior to Visit  Medication Sig  . aspirin EC 81 MG tablet Take 81 mg by mouth daily.  Marland Kitchen CRANBERRY EXTRACT PO Take 1 tablet by mouth daily.    . cyclobenzaprine (FLEXERIL) 10 MG tablet Take 1 tablet (10 mg total)  by mouth 3 (three) times daily as needed for muscle spasms. Take 1/2-1 tab  . Hydroxyamphetamine-Tropicamide (PAREMYD OP) Apply to eye.  Marland Kitchen ibuprofen (ADVIL,MOTRIN) 600 MG tablet Take 600 mg by mouth every morning.  Marland Kitchen lisinopril (PRINIVIL,ZESTRIL) 10 MG tablet Take 1 tablet daily for BP  . Multiple  Vitamins-Minerals (MULTIVITAMIN PO) Take by mouth daily.  . Phenazopyridine HCl (AZO TABS PO) Take 2 tablets by mouth daily.  Marland Kitchen rOPINIRole (REQUIP) 0.25 MG tablet TAKE 1 TABLET BY MOUTH AT 11 AM FOR 1 WEEK. INCREASE TO 2 TABLETS  . rOPINIRole (REQUIP) 1 MG tablet TAKE 1 TABLET(1 MG) BY MOUTH THREE TIMES DAILY  . VITAMIN D, CHOLECALCIFEROL, PO Take 4,000 Units by mouth daily.    No current facility-administered medications on file prior to visit.      Allergies:  Allergies  Allergen Reactions  . Hctz [Hydrochlorothiazide]     Leg cramping  . Metformin And Related Nausea Only  . Phenylbutazones Swelling  . Tropicamide Swelling    Red /itching/watery eyes     Medical History:  Past Medical History:  Diagnosis Date  . Allergy    otc med prn  . Arthritis    hips, lower back  . Cataract    bilateral  . Chronic kidney disease    stage 3 per patient   . Diet-controlled type 2 diabetes mellitus (HCC)    Diet controlled, no meds  . Diverticulosis   . Dribbling urine   . EKG abnormalities    pt states she had a BBB on her last EKG, no problems  . Essential hypertension, benign 08/19/2013  . HPV in female   . Hyperlipidemia    diet controlled, no med  . Restless leg syndrome   . Snoring   . Vitamin D deficiency    Family history- Reviewed and unchanged Social history- Reviewed and unchanged   Review of Systems:  Review of Systems  Constitutional: Negative for malaise/fatigue and weight loss.  HENT: Negative for hearing loss and tinnitus.   Eyes: Negative for blurred vision and double vision.  Respiratory: Negative for cough, shortness of breath and wheezing.   Cardiovascular: Negative for chest pain, palpitations, orthopnea, claudication and leg swelling.  Gastrointestinal: Negative for abdominal pain, blood in stool, constipation, diarrhea, heartburn, melena, nausea and vomiting.  Genitourinary: Negative.   Musculoskeletal: Positive for back pain (Chronic). Negative for  joint pain and myalgias.  Skin: Negative for rash.  Neurological: Negative for dizziness, tingling, sensory change, weakness and headaches.  Endo/Heme/Allergies: Negative for polydipsia.  Psychiatric/Behavioral: Negative.  Negative for depression and memory loss. The patient is not nervous/anxious and does not have insomnia.   All other systems reviewed and are negative.    Physical Exam: BP 126/74   Pulse 65   Temp (!) 97.5 F (36.4 C)   Resp 14   Ht 5\' 5"  (1.651 m)   Wt 168 lb (76.2 kg)   SpO2 98%   BMI 27.96 kg/m  Wt Readings from Last 3 Encounters:  02/16/18 168 lb (76.2 kg)  11/17/17 166 lb (75.3 kg)  08/01/17 174 lb (78.9 kg)   General Appearance: Well nourished, in no apparent distress. Eyes: PERRLA, EOMs, conjunctiva no swelling or erythema Sinuses: No Frontal/maxillary tenderness ENT/Mouth: Ext aud canals clear, TMs without erythema, bulging. No erythema, swelling, or exudate on post pharynx.  Tonsils not swollen or erythematous. Hearing normal.  Neck: Supple, thyroid normal.  Respiratory: Respiratory effort normal, BS equal bilaterally without rales, rhonchi, wheezing or stridor.  Cardio: RRR with no MRGs. Brisk peripheral pulses without edema.  Abdomen: Soft, + BS.  Non tender, no guarding, rebound, hernias, masses. Lymphatics: Non tender without lymphadenopathy.  Musculoskeletal: Full ROM, 5/5 strength, Normal gait Skin: Warm, dry without rashes, lesions, ecchymosis.  Neuro: Cranial nerves intact. No cerebellar symptoms.  Psych: Awake and oriented X 3, normal affect, Insight and Judgment appropriate.    Vicie Mutters, PA-C 10:37 AM [id number] [facility]

## 2018-02-17 LAB — LIPID PANEL
Cholesterol: 166 mg/dL (ref <200)
HDL: 36 mg/dL — ABNORMAL LOW (ref >50)
LDL Cholesterol (Calc): 104 mg/dL (calc) — ABNORMAL HIGH
NON-HDL CHOLESTEROL (CALC): 130 mg/dL — AB (ref <130)
Total CHOL/HDL Ratio: 4.6 (calc) (ref <5.0)
Triglycerides: 145 mg/dL (ref <150)

## 2018-02-17 LAB — CBC WITH DIFFERENTIAL/PLATELET
Basophils Absolute: 31 cells/uL (ref 0–200)
Basophils Relative: 0.5 %
EOS ABS: 281 {cells}/uL (ref 15–500)
Eosinophils Relative: 4.6 %
HCT: 40.8 % (ref 35.0–45.0)
HEMOGLOBIN: 13.7 g/dL (ref 11.7–15.5)
Lymphs Abs: 2379 cells/uL (ref 850–3900)
MCH: 29.1 pg (ref 27.0–33.0)
MCHC: 33.6 g/dL (ref 32.0–36.0)
MCV: 86.6 fL (ref 80.0–100.0)
MONOS PCT: 6.4 %
MPV: 9.8 fL (ref 7.5–12.5)
Neutro Abs: 3020 cells/uL (ref 1500–7800)
Neutrophils Relative %: 49.5 %
Platelets: 186 10*3/uL (ref 140–400)
RBC: 4.71 10*6/uL (ref 3.80–5.10)
RDW: 12.7 % (ref 11.0–15.0)
Total Lymphocyte: 39 %
WBC mixed population: 390 cells/uL (ref 200–950)
WBC: 6.1 10*3/uL (ref 3.8–10.8)

## 2018-02-17 LAB — COMPLETE METABOLIC PANEL WITH GFR
AG RATIO: 1.5 (calc) (ref 1.0–2.5)
ALBUMIN MSPROF: 4.3 g/dL (ref 3.6–5.1)
ALT: 16 U/L (ref 6–29)
AST: 18 U/L (ref 10–35)
Alkaline phosphatase (APISO): 73 U/L (ref 33–130)
BUN/Creatinine Ratio: 12 (calc) (ref 6–22)
BUN: 13 mg/dL (ref 7–25)
CO2: 26 mmol/L (ref 20–32)
Calcium: 9.5 mg/dL (ref 8.6–10.4)
Chloride: 103 mmol/L (ref 98–110)
Creat: 1.1 mg/dL — ABNORMAL HIGH (ref 0.50–0.99)
GFR, EST AFRICAN AMERICAN: 60 mL/min/{1.73_m2} (ref >=60)
GFR, Est Non African American: 52 mL/min/{1.73_m2} — ABNORMAL LOW (ref >=60)
GLOBULIN: 2.9 g/dL (ref 1.9–3.7)
Glucose, Bld: 85 mg/dL (ref 65–99)
POTASSIUM: 4.3 mmol/L (ref 3.5–5.3)
SODIUM: 137 mmol/L (ref 135–146)
TOTAL PROTEIN: 7.2 g/dL (ref 6.1–8.1)
Total Bilirubin: 0.4 mg/dL (ref 0.2–1.2)

## 2018-02-17 LAB — HEMOGLOBIN A1C
HEMOGLOBIN A1C: 6.5 %{Hb} — AB (ref <5.7)
MEAN PLASMA GLUCOSE: 140 (calc)
eAG (mmol/L): 7.7 (calc)

## 2018-02-17 LAB — TSH: TSH: 1.57 m[IU]/L (ref 0.40–4.50)

## 2018-02-17 LAB — MAGNESIUM: MAGNESIUM: 2 mg/dL (ref 1.5–2.5)

## 2018-03-10 ENCOUNTER — Other Ambulatory Visit: Payer: Self-pay | Admitting: Internal Medicine

## 2018-03-10 DIAGNOSIS — G2581 Restless legs syndrome: Secondary | ICD-10-CM

## 2018-03-23 ENCOUNTER — Encounter: Payer: Self-pay | Admitting: Neurology

## 2018-03-23 ENCOUNTER — Ambulatory Visit (INDEPENDENT_AMBULATORY_CARE_PROVIDER_SITE_OTHER): Payer: Medicare Other | Admitting: Neurology

## 2018-03-23 VITALS — BP 110/70 | HR 75 | Ht 65.0 in | Wt 167.1 lb

## 2018-03-23 DIAGNOSIS — R200 Anesthesia of skin: Secondary | ICD-10-CM

## 2018-03-23 DIAGNOSIS — G2581 Restless legs syndrome: Secondary | ICD-10-CM | POA: Diagnosis not present

## 2018-03-23 NOTE — Progress Notes (Signed)
Follow-up Visit   Date: 03/23/18    Rachel Daniel MRN: [id number] DOB: 1951-05-31   Interim History: Rachel Daniel is a 67 y.o. right-handed Caucasian female with diabetes mellitus (HbA1c 6.5), hypertension, RLS returning to the clinic for follow-up of restless leg syndrome and new complains of right foot numbness.  The patient was accompanied to the clinic by  who also provides collateral information.    History of present illness: Starting around her 48s, she recalls having achy pain of the right thigh, which would prohibit her from falling asleep.  Her husband often complained that she would kick him all night long.  She has squeezing, stabbing, knife-like pain of bilateral thighs. She does not have numbness or tingling.  Pain is mostly over the anterior and lateral portion of the thigh, but can involve the left medial thigh.  The lower legs and feet are spared.  She only has relief with walking and stretching.  Pain was severe at night time, but over the past several years started having daytime symptoms and she cannot get relief with anything.  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 Her pain is better on the weekends when she is less sedentary.  She was diagnosed with RLS in her 60s and started on ropinorole and takes 3mg  at bedtime which provides relief until lunch time.  She is unable to take ropinorole during the day because of sedation.  She is intolerant to Lyrica.  She denies any weakness.    She also complains of numbness of the right great toe since her 14s.  This has been constant and there is no tingling or pain.  She denies weakness.  Symptoms have not progressed and do not bother her.  She does not have similar symptoms on the left foot.  UPDATE 03/21/2017:  She is here for 3 month follow-up visit and reports having significant improvement of her daytime RLS symptoms with the addition of ropinirole 0.25mg .  She still has some breakthrough discomfort,  but does not wish to increase the medication.  She sleeps well throughout the night on ropinirole 3mg  at bedtime. Right toe numbness is unchanged and long standing, she does not have similar symptoms on the left.  UPDATE 03/23/2018:  She is here for 1 year follow-up visit.   She increased her afternoon dose of ropinirole to 0.5mg  at 3pm and continues 3mg  at 8pm. Unfortunately, she continues to have breakthrough pain in the afternoon and tries to walk it off.    She also complains of new numbness of the entire right foot (dorsum, sole, toes).  Symptoms are intermittent and worse at the day progresses. She denies any back or leg pain, or weakness.  She has history of low back pain.    Medications:  Current Outpatient Medications on File Prior to Visit  Medication Sig Dispense Refill  . aspirin EC 81 MG tablet Take 81 mg by mouth daily.    Marland Kitchen CRANBERRY EXTRACT PO Take 1 tablet by mouth daily.      . cyclobenzaprine (FLEXERIL) 10 MG tablet Take 1 tablet (10 mg total) by mouth 3 (three) times daily as needed for muscle spasms. Take 1/2-1 tab 90 tablet 1  . Hydroxyamphetamine-Tropicamide (PAREMYD OP) Apply to eye.    Marland Kitchen ibuprofen (ADVIL,MOTRIN) 600 MG tablet Take 600 mg by mouth every morning.    Marland Kitchen lisinopril (PRINIVIL,ZESTRIL) 10 MG tablet Take 1 tablet daily for BP 90 tablet 1  .  Multiple Vitamins-Minerals (MULTIVITAMIN PO) Take by mouth daily.    . Phenazopyridine HCl (AZO TABS PO) Take 2 tablets by mouth daily.    Marland Kitchen rOPINIRole (REQUIP) 0.25 MG tablet TAKE 1 TABLET BY MOUTH AT 11 AM FOR 1 WEEK. INCREASE TO 2 TABLETS 60 tablet 5  . rOPINIRole (REQUIP) 1 MG tablet TAKE 1 TABLET(1 MG) BY MOUTH THREE TIMES DAILY (Patient taking differently: take 3 mg at bedtime) 270 tablet 0  . VITAMIN D, CHOLECALCIFEROL, PO Take 4,000 Units by mouth daily.      No current facility-administered medications on file prior to visit.     Allergies:  Allergies  Allergen Reactions  . Hctz [Hydrochlorothiazide]     Leg  cramping  . Metformin And Related Nausea Only  . Phenylbutazones Swelling  . Tropicamide Swelling    Red /itching/watery eyes    Review of Systems:  CONSTITUTIONAL: No fevers, chills, night sweats, or weight loss.  EYES: No visual changes or eye pain ENT: No hearing changes.  No history of nose bleeds.   RESPIRATORY: No cough, wheezing and shortness of breath.   CARDIOVASCULAR: Negative for chest pain, and palpitations.   GI: Negative for abdominal discomfort, blood in stools or black stools.  No recent change in bowel habits.   GU:  No history of incontinence.   MUSCLOSKELETAL: No history of joint pain or swelling.  No myalgias.   SKIN: Negative for lesions, rash, and itching.   ENDOCRINE: Negative for cold or heat intolerance, polydipsia or goiter.   PSYCH:  No depression or anxiety symptoms.   NEURO: As Above.   Vital Signs:  BP 110/70   Pulse 75   Ht 5\' 5"  (1.651 m)   Wt 167 lb 2 oz (75.8 kg)   SpO2 98%   BMI 27.81 kg/m    General Medical Exam:   General:  Well appearing, comfortable  Eyes/ENT: see cranial nerve examination.   Neck: No masses appreciated.  Full range of motion without tenderness.  No carotid bruits. Respiratory:  Clear to auscultation, good air entry bilaterally.   Cardiac:  Regular rate and rhythm, no murmur.   Ext:  No edema  Neurological Exam: MENTAL STATUS including orientation to time, place, person, and recent and remote memory is normal.  Speech is not dysarthric.  CRANIAL NERVES:  Face is symmetric.   MOTOR:  No atrophy, fasciculations or abnormal movements.  No pronator drift.  Tone is normal.    Right Upper Extremity:    Left Upper Extremity:    Deltoid  5/5   Deltoid  5/5   Biceps  5/5   Biceps  5/5   Triceps  5/5   Triceps  5/5   Wrist extensors  5/5   Wrist extensors  5/5   Wrist flexors  5/5   Wrist flexors  5/5   Finger extensors  5/5   Finger extensors  5/5   Finger flexors  5/5   Finger flexors  5/5   Dorsal interossei  5/5    Dorsal interossei  5/5   Abductor pollicis  5/5   Abductor pollicis  5/5   Tone (Ashworth scale)  0  Tone (Ashworth scale)  0   Right Lower Extremity:    Left Lower Extremity:    Hip flexors  4+/5   Hip flexors  5/5   Hip extensors  5/5   Hip extensors  5/5   Knee flexors  5/5   Knee flexors  5/5   Knee extensors  5/5   Knee extensors  5/5   Dorsiflexors  5-/5   Dorsiflexors  5/5   Plantarflexors  5/5   Plantarflexors  5/5   Toe extensors  5-/5   Toe extensors  5/5   Toe flexors  5/5   Toe flexors  5/5   Tone (Ashworth scale)  0  Tone (Ashworth scale)  0   MSRs:  Reflexes are 2+/4, except absent right Achilles  SENSORY:   Absent vibration at the right great toe and reduced at the right ankle, vibration is intact on the left. Pin prick and temperature is slightly reduced over the dorsum of the right foot. Sensation intact to all modalities on the left leg.  COORDINATION/GAIT:  Normal finger-to- nose-finger.  Gait narrow based and stable.   Data: Lab Results  Component Value Date   FERRITIN 100 10/10/2016   Lab Results  Component Value Date   TSH 1.57 02/16/2018   Lab Results  Component Value Date   [id number] 683 04/06/2015    IMPRESSION/PLAN:  1.  Restless leg syndrome, there is breakthrough symptoms in the afternoon.  I offered to increase the dose of her afternoon ropinirole to 0.75mg , but she would like to stay on the current dose.  Continue ropinirole 0.5mg  at 5pm and 3pm at 8pm.  2.  Right foot numbness with dorsiflexion and hip flexion weakness is concerning for L4-5 radiculopathy.  MRI lumbar spine and PT was recommended, patient declined.  She would like to try her own home exercises and will contact my office, if symptoms get worse.   Return to clinic in 6 months      Thank you for allowing me to participate in patient's care.  If I can answer any additional questions, I would be pleased to do so.    Sincerely,    Madoline Bhatt K. Posey Pronto, DO

## 2018-03-23 NOTE — Patient Instructions (Signed)
If your numbness gets worse, please call my office to order MRI lumbar spine  Continue your medications as you are taking  Return to clinic in 6 months

## 2018-04-13 ENCOUNTER — Other Ambulatory Visit: Payer: Self-pay | Admitting: Physician Assistant

## 2018-04-13 DIAGNOSIS — Z1231 Encounter for screening mammogram for malignant neoplasm of breast: Secondary | ICD-10-CM

## 2018-04-20 ENCOUNTER — Encounter: Payer: Self-pay | Admitting: Physician Assistant

## 2018-04-28 ENCOUNTER — Other Ambulatory Visit: Payer: Self-pay | Admitting: Internal Medicine

## 2018-05-12 ENCOUNTER — Ambulatory Visit
Admission: RE | Admit: 2018-05-12 | Discharge: 2018-05-12 | Disposition: A | Discharge status: Home / Self Care | Payer: Medicare Other | Source: Ambulatory Visit | Attending: Physician Assistant | Admitting: Physician Assistant

## 2018-05-12 DIAGNOSIS — Z1231 Encounter for screening mammogram for malignant neoplasm of breast: Secondary | ICD-10-CM | POA: Diagnosis not present

## 2018-05-26 ENCOUNTER — Other Ambulatory Visit: Payer: Self-pay | Admitting: Neurology

## 2018-06-01 ENCOUNTER — Telehealth: Payer: Self-pay | Admitting: Neurology

## 2018-06-01 NOTE — Telephone Encounter (Signed)
Patient states that the RX for the Ropinirole 0.25 twice a day has been denied and she wants to know why. Please call

## 2018-06-02 NOTE — Telephone Encounter (Signed)
Patient is aware that I am working on getting this approved.

## 2018-06-04 NOTE — Progress Notes (Signed)
Medicare wellness visit  Assessment and Plan: Essential hypertension, benign - continue medications, DASH diet, exercise and monitor at home. Call if greater than 130/80. - GET ON AND STAY ON LISINOPRIL  - CBC with Differential/Platelet - BASIC METABOLIC PANEL WITH GFR - Hepatic function panel - TSH   Restless leg syndrome Continue requip   Hyperlipidemia -continue medications, check lipids, decrease fatty foods, increase activity.  - Lipid panel   Diverticulosis of large intestine without hemorrhage Remission, increase fiber  Vitamin D deficiency - Vit D  25 hydroxy (rtn osteoporosis monitoring)   Medication management - Magnesium  Gastroesophageal reflux disease with esophagitis Continue PPI/H2 blocker, diet discussed  Medicare encounter  Diabetic autonomic neuropathy associated with type 2 diabetes mellitus Do daily feet checks  Controlled type 2 diabetes mellitus with stage 2 chronic kidney disease, without long-term current use of insulin (HCC) -     COMPLETE METABOLIC PANEL WITH GFR -     Hemoglobin A1c  CKD stage 2 due to type 2 diabetes mellitus (HCC) -     COMPLETE METABOLIC PANEL WITH GFR -     Hemoglobin A1c  Overweight (BMI 25.0-29.9)  Advanced care planning/counseling discussion Discussed advanced care planning with patient and/or family At least 30 mins discussed with the patient and/or family at the visit.    Discussed med's effects and SE's. Screening labs and tests as requested with regular follow-up as recommended.  Future Appointments  Date Time Provider [place]  09/23/2018  9:30 AM Alda Berthold, DO [facility] None     During the course of the visit the patient was educated and counseled about appropriate screening and preventive services including:    Pneumococcal vaccine   Influenza vaccine  Td vaccine  Prevnar 13  Screening electrocardiogram  Screening mammography  Bone densitometry screening  Colorectal  cancer screening  Diabetes screening  Glaucoma screening  Nutrition counseling   Advanced directives: given info/requested copies   HPI 67 y.o. female  presents for a complete physical and medicare wellness.  She retired May 2nd 2018, states she is doing well. Daughter is here with her today.   Her blood pressure has been controlled at home, today their BP is BP: 120/76 She does workout, walks the dog. She denies chest pain, shortness of breath, dizziness.  She is not on cholesterol medication and denies myalgias. Her cholesterol is at goal. The cholesterol last visit was:   Lab Results  Component Value Date   CHOL 166 02/16/2018   HDL 36 (L) 02/16/2018   LDLCALC 104 (H) 02/16/2018   TRIG 145 02/16/2018   CHOLHDL 4.6 02/16/2018   She has been working on diet and exercise for diabetes with CKD stage II but could not tolerate the metformin, she is on bASA, she is on an ACE, and denies polydipsia, polyuria and visual disturbances.  Last A1C in the office was:  Lab Results  Component Value Date   HGBA1C 6.5 (H) 02/16/2018   Lab Results  Component Value Date   GFRNONAA 52 (L) 02/16/2018   Patient is on Vitamin D supplement.   Lab Results  Component Value Date   VD25OH 21 08/01/2017   She has GERD She is on requip for RLS which helps, she follows with Dr. Posey Pronto, on 3 at night and occ 0.25mg  during the day  BMI is Body mass index is 27.96 kg/m., she is working on diet and exercise. Wt Readings from Last 3 Encounters:  06/09/18 168 lb (76.2 kg)  03/23/18  167 lb 2 oz (75.8 kg)  02/16/18 168 lb (76.2 kg)     Current Medications:  Current Outpatient Medications on File Prior to Visit  Medication Sig  . aspirin EC 81 MG tablet Take 81 mg by mouth daily.  Marland Kitchen CRANBERRY EXTRACT PO Take 1 tablet by mouth daily.    . cyclobenzaprine (FLEXERIL) 10 MG tablet Take 1 tablet (10 mg total) by mouth 3 (three) times daily as needed for muscle spasms. Take 1/2-1 tab  .  Hydroxyamphetamine-Tropicamide (PAREMYD OP) Apply to eye.  Marland Kitchen ibuprofen (ADVIL,MOTRIN) 600 MG tablet Take 600 mg by mouth every morning.  Marland Kitchen lisinopril (PRINIVIL,ZESTRIL) 10 MG tablet TAKE 1 TABLET BY MOUTH DAILY FOR BLOOD PRESSURE  . Multiple Vitamins-Minerals (MULTIVITAMIN PO) Take by mouth daily.  . Phenazopyridine HCl (AZO TABS PO) Take 2 tablets by mouth daily.  Marland Kitchen rOPINIRole (REQUIP) 0.25 MG tablet TAKE 1 TABLET BY MOUTH AT 11 AM FOR 1 WEEK. INCREASE TO 2 TABLETS  . rOPINIRole (REQUIP) 1 MG tablet TAKE 1 TABLET(1 MG) BY MOUTH THREE TIMES DAILY (Patient taking differently: take 3 mg at bedtime)  . VITAMIN D, CHOLECALCIFEROL, PO Take 4,000 Units by mouth daily.    No current facility-administered medications on file prior to visit.    Health Maintenance:   Immunization History  Administered Date(s) Administered  . Influenza, High Dose Seasonal PF 08/01/2017  . Pneumococcal Conjugate-13 04/05/2016  . Pneumococcal Polysaccharide-23 04/15/2017  . Tdap 03/07/2011   Tetanus: 2012 Pneumovax: 2018 Prevnar 13: 2017 Flu vaccine: 2018  Zostavax: declines  Pap: 2016 negative MGM: 04/2018  DEXA: 2008 normal Colonoscopy: 06/2016, Dr. Deatra Ina EGD: N/A MRI lumbar spine 2013 CXR 2017 Eye exam: Dr. Rodman Key 11/2017 Dentist: Dr. Harrington Challenger q 6 months 11/2016 Dr. Lynann Bologna ortho  Allergies:  Allergies  Allergen Reactions  . Hctz [Hydrochlorothiazide]     Leg cramping  . Metformin And Related Nausea Only  . Phenylbutazones Swelling  . Tropicamide Swelling    Red /itching/watery eyes   Medical History:  Past Medical History:  Diagnosis Date  . Allergy    otc med prn  . Arthritis    hips, lower back  . Cataract    bilateral  . Chronic kidney disease    stage 3 per patient   . Diet-controlled type 2 diabetes mellitus (HCC)    Diet controlled, no meds  . Diverticulosis   . Dribbling urine   . EKG abnormalities    pt states she had a BBB on her last EKG, no problems  . Essential  hypertension, benign 08/19/2013  . HPV in female   . Hyperlipidemia    diet controlled, no med  . Restless leg syndrome   . Snoring   . Vitamin D deficiency    Surgical History:  She  has a past surgical history that includes Vaginal hysterectomy (1992); Tubal ligation (1981); Ovarian cyst removal (1981); Lumbar laminectomy/decompression microdiscectomy (05/07/2012); Colonoscopy (2012); and Breast biopsy (Left, 1969).  Family History:  Her family history includes Atrial fibrillation in her brother, brother, sister, and sister; Cataracts in her mother; Colon cancer (age of onset: 23) in her mother; Colon polyps in her brother and sister; Diabetes in her mother, sister, and sister; Heart failure in her mother; Heart failure (age of onset: 73) in her father; Hypertension in her mother; Stroke in her mother; Stroke (age of onset: 69) in her sister.   Social History:  She  reports that she is a non-smoker but has been exposed to tobacco smoke.  She has never used smokeless tobacco. She reports that she does not drink alcohol or use drugs.  MEDICARE WELLNESS OBJECTIVES: Physical activity: Current Exercise Habits: The patient does not participate in regular exercise at present Cardiac risk factors: Cardiac Risk Factors include: advanced age (>93men, >31 women);diabetes mellitus;dyslipidemia;hypertension;family history of premature cardiovascular disease;sedentary lifestyle Depression/mood screen:   Depression screen [facility] 2/9 06/09/2018  Decreased Interest 0  Down, Depressed, Hopeless 0  PHQ - 2 Score 0    ADLs:  In your present state of health, do you have any difficulty performing the following activities: 06/09/2018 08/02/2017  Hearing? N N  Vision? N N  Difficulty concentrating or making decisions? N N  Walking or climbing stairs? N N  Dressing or bathing? N N  Doing errands, shopping? N N  Some recent data might be hidden    Cognitive Testing  Alert? Yes  Normal Appearance?Yes  Oriented  to person? Yes  Place? Yes   Time? Yes  Recall of three objects?  Yes  Can perform simple calculations? Yes  Displays appropriate judgment?Yes  Can read the correct time from a watch face?Yes  EOL planning: Does Patient Have a Medical Advance Directive?: No Would patient like information on creating a medical advance directive?: Yes (MAU/Ambulatory/Procedural Areas - Information given)  Review of Systems  Constitutional: Negative.   HENT: Negative.   Eyes: Negative.  Negative for blurred vision.  Respiratory: Negative for cough, hemoptysis, sputum production, shortness of breath and wheezing.   Cardiovascular: Negative.  Negative for chest pain.  Gastrointestinal: Negative.   Genitourinary: Negative.        Stress incontinence  Musculoskeletal: Positive for myalgias (bilateral legs). Negative for back pain, falls, joint pain and neck pain.  Skin: Negative for itching and rash.  Neurological: Positive for tingling and sensory change (bilateral feet).  Psychiatric/Behavioral: Negative.    Physical Exam: Estimated body mass index is 27.96 kg/m as calculated from the following:   Height as of this encounter: 5\' 5"  (1.651 m).   Weight as of this encounter: 168 lb (76.2 kg). BP 120/76   Pulse 67   Temp 98 F (36.7 C)   Ht 5\' 5"  (1.651 m)   Wt 168 lb (76.2 kg)   SpO2 98%   BMI 27.96 kg/m  General Appearance: Well nourished, in no apparent distress. Eyes: PERRLA, EOMs, conjunctiva no swelling or erythema, normal fundi and vessels. Sinuses: No Frontal/maxillary tenderness ENT/Mouth: Ext aud canals clear, normal light reflex with TMs without erythema, bulging.  Good dentition. No erythema, swelling, or exudate on post pharynx. Tonsils not swollen or erythematous. Hearing normal.  Neck: Supple, thyroid normal. No bruits Respiratory: Respiratory effort normal, BS equal bilaterally without rales, rhonchi, wheezing or stridor. Cardio: RRR without murmurs, rubs or gallops. Brisk  peripheral pulses without edema.  Chest: symmetric, with normal excursions and percussion. Breasts: Symmetric, without lumps, nipple discharge, retractions. Abdomen: Soft, +BS, Non tender, no guarding, rebound, hernias, masses, or organomegaly. .  Lymphatics: Non tender without lymphadenopathy.  Musculoskeletal: Full ROM all peripheral extremities,5/5 strength, and normal gait.  Skin: + varicose veins bilateral legs. Warm, dry without rashes, lesions, ecchymosis.  Neuro: Cranial nerves intact, reflexes equal bilaterally. Normal muscle tone, no cerebellar symptoms. Sensation decreased bilateral feet to mid shin.  Psych: Awake and oriented X 3, normal affect, Insight and Judgment appropriate.    Medicare Attestation I have personally reviewed: The patient's medical and social history Their use of alcohol, tobacco or illicit drugs Their current medications and supplements  The patient's functional ability including ADLs,fall risks, home safety risks, cognitive, and hearing and visual impairment Diet and physical activities Evidence for depression or mood disorders  The patient's weight, height, BMI, and visual acuity have been recorded in the chart.  I have made referrals, counseling, and provided education to the patient based on review of the above and I have provided the patient with a written personalized care plan for preventive services.     Vicie Mutters 10:58 AM

## 2018-06-05 ENCOUNTER — Telehealth: Payer: Self-pay | Admitting: Neurology

## 2018-06-05 NOTE — Telephone Encounter (Signed)
I called new Rx in to pharmacy.  They will call patient when ready.

## 2018-06-05 NOTE — Telephone Encounter (Signed)
Patient called and left a voicemail stating that she needed a refill on her prescription. She stated that she had none left but did not give the name. Please call her back at [phone] or [phone]. Thanks.

## 2018-06-09 ENCOUNTER — Ambulatory Visit (INDEPENDENT_AMBULATORY_CARE_PROVIDER_SITE_OTHER): Payer: Medicare Other | Admitting: Physician Assistant

## 2018-06-09 ENCOUNTER — Encounter (INDEPENDENT_AMBULATORY_CARE_PROVIDER_SITE_OTHER): Payer: Self-pay

## 2018-06-09 ENCOUNTER — Encounter: Payer: Self-pay | Admitting: Physician Assistant

## 2018-06-09 VITALS — BP 120/76 | HR 67 | Temp 98.0°F | Ht 65.0 in | Wt 168.0 lb

## 2018-06-09 DIAGNOSIS — E663 Overweight: Secondary | ICD-10-CM

## 2018-06-09 DIAGNOSIS — E1122 Type 2 diabetes mellitus with diabetic chronic kidney disease: Secondary | ICD-10-CM

## 2018-06-09 DIAGNOSIS — Z0000 Encounter for general adult medical examination without abnormal findings: Secondary | ICD-10-CM

## 2018-06-09 DIAGNOSIS — E782 Mixed hyperlipidemia: Secondary | ICD-10-CM | POA: Diagnosis not present

## 2018-06-09 DIAGNOSIS — Z0001 Encounter for general adult medical examination with abnormal findings: Secondary | ICD-10-CM | POA: Diagnosis not present

## 2018-06-09 DIAGNOSIS — G2581 Restless legs syndrome: Secondary | ICD-10-CM | POA: Diagnosis not present

## 2018-06-09 DIAGNOSIS — E1142 Type 2 diabetes mellitus with diabetic polyneuropathy: Secondary | ICD-10-CM

## 2018-06-09 DIAGNOSIS — Z79899 Other long term (current) drug therapy: Secondary | ICD-10-CM

## 2018-06-09 DIAGNOSIS — K2100 Gastro-esophageal reflux disease with esophagitis, without bleeding: Secondary | ICD-10-CM

## 2018-06-09 DIAGNOSIS — K5730 Diverticulosis of large intestine without perforation or abscess without bleeding: Secondary | ICD-10-CM | POA: Diagnosis not present

## 2018-06-09 DIAGNOSIS — N182 Chronic kidney disease, stage 2 (mild): Secondary | ICD-10-CM | POA: Diagnosis not present

## 2018-06-09 DIAGNOSIS — E559 Vitamin D deficiency, unspecified: Secondary | ICD-10-CM

## 2018-06-09 DIAGNOSIS — Z7189 Other specified counseling: Secondary | ICD-10-CM | POA: Diagnosis not present

## 2018-06-09 DIAGNOSIS — R6889 Other general symptoms and signs: Secondary | ICD-10-CM

## 2018-06-09 DIAGNOSIS — I10 Essential (primary) hypertension: Secondary | ICD-10-CM | POA: Diagnosis not present

## 2018-06-09 MED ORDER — ROPINIROLE HCL 3 MG PO TABS: 3.0000 mg | ORAL_TABLET | Freq: Every day | ORAL | 1 refills | Status: DC

## 2018-06-09 NOTE — Patient Instructions (Addendum)
Put hydrocortiosone on that area on your leg and elevate that foot x 1 week If it is still there than call  Statin therapy  In addition to the known lipid-lowering effects, statins are now widely accepted to have anti-inflammatory and immunomodulatory effects. Adjunctive use of statins has proven beneficial in the context of a wide range of inflammatory diseases, including rheumatoid arthritis.  Research has shown that the salutary effect of statins on cholesterol may not be their only benefit. Statin therapy has shown promise for everything from fighting viral infections to protecting the eye from cataracts.  A 2005 study of more than 700 hospital patients being treated for pneumonia found that the death rate was more than twice as high among those who were not using statins. In 2006, a [place] study examined the rate of sepsis, a deadly blood infection, among patients who had been hospitalized for heart events. In the two years after their hospitalization, the statin users had a rate of sepsis 19% lower than that of the non-statin users. A 2009 review of 22 studies found that statins appeared to have a beneficial effect on the outcome of infection, but they couldn't come to a firm conclusion.  Your LDL is not in range or at goal, goal is less than 70.  Your LDL is the bad cholesterol that can lead to heart attack and stroke. To lower your number you can decrease your fatty foods, red meat, cheese, milk and increase fiber like whole grains and veggies. You can also add a fiber supplement like Citracel or Benefiber, these do not cause gas and bloating and are safe to use.    Vascular Dementia Dementia is a condition in which a person has problems with thinking, memory, and behavior that are severe enough to interfere with daily life. Vascular dementia is a type of dementia. It results from brain damage that is caused by the brain not getting enough blood. Vascular dementia usually begins between 20  and 36 years of age. What are the causes? Vascular dementia is caused by conditions that lessen blood flow to the brain. Common causes include:  Multiple small strokes. These may happen without symptoms (silent stroke).  Major stroke.  Damage to small blood vessels in the brain (cerebral small vessel disease).  What increases the risk?  Advancing age.  Having had a stroke.  Having high blood pressure (hypertension) or high cholesterol.  Having a disease that affects the heart or blood vessels.  Smoking.  Having diabetes.  Being female.  Being obese.  Not being active.  Having depression. What are the signs or symptoms? Symptoms can vary a lot from one person to another. Symptoms may be mild or severe depending on the amount of damage and which parts of the brain have been affected. Symptoms may begin suddenly or may develop gradually. Symptoms may remain stable, or they may get worse over time. Symptoms of vascular dementia may be similar to those of Alzheimer disease. The two conditions can occur together (mixed dementia). Symptoms of vascular dementia may include: Mental  Confusion.  Memory problems.  Poor attention and concentration.  Trouble understanding speech.  Depression.  Personality changes.  Trouble recognizing familiar people.  Agitation or aggression.  Paranoia.  Delusions or hallucinations. Physical  Weakness.  Poor balance.  Loss of bladder or bowel control (incontinence).  Unsteady walking (gait).  Speaking problems. Behavioral  Getting lost in familiar places.  Problems with planning and judgment.  Trouble following instructions.  Social problems.  Emotional outbursts.  Trouble with daily activities and self-care.  Problems handling money. How is this diagnosed? There is not a specific test to diagnose vascular dementia. The health care provider will consider the person's medical history and symptoms or changes that are  reported by friends and family. The health care provider will do a physical exam and may order lab tests or other tests that check brain and nervous system function. Tests that may be done include:  Blood tests.  Brain imaging tests.  Tests of movement, speech, and other daily activities (neurological exam).  Tests of memory, thinking, and problem-solving (neuropsychological or neurocognitive testing).  Diagnosis may involve several specialists. These may include a health care provider who specializes in the brain and nervous system (neurologist), a provider who specializes in disorders of the mind (psychiatrist), and a provider who focuses on speech and language changes (Electrical engineer). How is this treated? There is no cure for vascular dementia. Brain damage that has already occurred cannot be reversed. Treatment depends on:  How severe the condition is.  Which parts of the brain have been affected.  The person's overall health.  Treatment measures aim to:  Treat the underlying cause of vascular dementia and manage risk factors. This may include: ? Controlling blood pressure. ? Lowering cholesterol. ? Treating diabetes. ? Quitting smoking. ? Losing weight.  Manage symptoms.  Prevent further brain damage.  Improve the person's health and quality of life.  Treatment for dementia may involve a team of health care providers, including:  A neurologist.  A psychiatrist.  An occupational therapist.  A speech pathologist.  A cardiologist.  An exercise physiologist or physical therapist.  Follow these instructions at home: Home care for a person with vascular dementia depends on what caused the condition and how severe the symptoms are. General guidelines for care at home include:  Following the health care provider's instructions for treating the condition that caused the dementia.  Using medicines only as told by the person's health care provider.  Creating a  safe living space.  Learning ways to help the person remember people, appointments, and daily activities.  Finding a support group to help caregivers and family to cope with the effects of dementia.  Helping family and friends learn about ways to communicate with a person who has dementia.  Making sure the person keeps all follow-up visits and goes to all rehabilitation appointments as told by the health care team. This is important.  Contact a health care provider if:  A fever develops.  New behavioral problems develop.  Problems with swallowing develop.  Confusion gets worse.  Sleepiness gets worse. Get help right away if:  Loss of consciousness occurs.  There is a sudden loss of speech, balance, or thinking ability.  New numbness or paralysis occurs.  Sudden, severe headache occurs.  Vision is lost or suddenly gets worse in one or both eyes. This information is not intended to replace advice given to you by your health care provider. Make sure you discuss any questions you have with your health care provider. Document Released: 09/20/2002 Document Revised: 03/07/2016 Document Reviewed: 01/11/2015 Elsevier Interactive Patient Education  2018 [employer].

## 2018-06-10 LAB — CBC WITH DIFFERENTIAL/PLATELET
Basophils Absolute: 41 cells/uL (ref 0–200)
Basophils Relative: 0.7 %
EOS ABS: 283 {cells}/uL (ref 15–500)
Eosinophils Relative: 4.8 %
HEMATOCRIT: 40.4 % (ref 35.0–45.0)
Hemoglobin: 13.5 g/dL (ref 11.7–15.5)
LYMPHS ABS: 2012 {cells}/uL (ref 850–3900)
MCH: 29.3 pg (ref 27.0–33.0)
MCHC: 33.4 g/dL (ref 32.0–36.0)
MCV: 87.6 fL (ref 80.0–100.0)
MPV: 9.7 fL (ref 7.5–12.5)
Monocytes Relative: 6.5 %
NEUTROS PCT: 53.9 %
Neutro Abs: 3180 cells/uL (ref 1500–7800)
PLATELETS: 166 10*3/uL (ref 140–400)
RBC: 4.61 10*6/uL (ref 3.80–5.10)
RDW: 12.6 % (ref 11.0–15.0)
TOTAL LYMPHOCYTE: 34.1 %
WBC mixed population: 384 cells/uL (ref 200–950)
WBC: 5.9 10*3/uL (ref 3.8–10.8)

## 2018-06-10 LAB — COMPLETE METABOLIC PANEL WITH GFR
AG RATIO: 1.4 (calc) (ref 1.0–2.5)
ALT: 20 U/L (ref 6–29)
AST: 22 U/L (ref 10–35)
Albumin: 4.2 g/dL (ref 3.6–5.1)
Alkaline phosphatase (APISO): 75 U/L (ref 33–130)
BILIRUBIN TOTAL: 0.4 mg/dL (ref 0.2–1.2)
BUN/Creatinine Ratio: 17 (calc) (ref 6–22)
BUN: 18 mg/dL (ref 7–25)
CHLORIDE: 105 mmol/L (ref 98–110)
CO2: 25 mmol/L (ref 20–32)
Calcium: 9.7 mg/dL (ref 8.6–10.4)
Creat: 1.06 mg/dL — ABNORMAL HIGH (ref 0.50–0.99)
GFR, EST AFRICAN AMERICAN: 63 mL/min/{1.73_m2} (ref >=60)
GFR, EST NON AFRICAN AMERICAN: 54 mL/min/{1.73_m2} — AB (ref >=60)
GLOBULIN: 3 g/dL (ref 1.9–3.7)
Glucose, Bld: 95 mg/dL (ref 65–99)
POTASSIUM: 4.6 mmol/L (ref 3.5–5.3)
SODIUM: 139 mmol/L (ref 135–146)
TOTAL PROTEIN: 7.2 g/dL (ref 6.1–8.1)

## 2018-06-10 LAB — LIPID PANEL
Cholesterol: 172 mg/dL (ref <200)
HDL: 42 mg/dL — ABNORMAL LOW (ref >50)
LDL Cholesterol (Calc): 109 mg/dL (calc) — ABNORMAL HIGH
NON-HDL CHOLESTEROL (CALC): 130 mg/dL — AB (ref <130)
Total CHOL/HDL Ratio: 4.1 (calc) (ref <5.0)
Triglycerides: 103 mg/dL (ref <150)

## 2018-06-10 LAB — TSH: TSH: 1.12 mIU/L (ref 0.40–4.50)

## 2018-06-10 LAB — HEMOGLOBIN A1C
Hgb A1c MFr Bld: 6.8 % of total Hgb — ABNORMAL HIGH (ref <5.7)
MEAN PLASMA GLUCOSE: 148 (calc)
eAG (mmol/L): 8.2 (calc)

## 2018-06-10 LAB — MAGNESIUM: Magnesium: 2 mg/dL (ref 1.5–2.5)

## 2018-06-13 ENCOUNTER — Other Ambulatory Visit: Payer: Self-pay | Admitting: Physician Assistant

## 2018-06-13 DIAGNOSIS — G2581 Restless legs syndrome: Secondary | ICD-10-CM

## 2018-07-28 ENCOUNTER — Other Ambulatory Visit: Payer: Self-pay | Admitting: Internal Medicine

## 2018-08-12 ENCOUNTER — Other Ambulatory Visit: Payer: Self-pay | Admitting: Physician Assistant

## 2018-08-12 DIAGNOSIS — G2581 Restless legs syndrome: Secondary | ICD-10-CM

## 2018-09-21 NOTE — Progress Notes (Signed)
FOLLOW UP  Assessment and Plan:   Hypertension Well controlled with current medications  Monitor blood pressure at home; patient to call if consistently greater than 130/80 Continue DASH diet.   Reminder to go to the ER if any CP, SOB, nausea, dizziness, severe HA, changes vision/speech, left arm numbness and tingling and jaw pain.  Cholesterol Currently near goal but not on statin despite T2DM; discussed initiating low dose per guidelines today - she states she will refuse all statins  - will work on diet Information for dietary recommendations provided on AVS and reviewed with patient Continue low cholesterol diet and exercise.  Check lipid panel.   Diabetes with diabetic chronic kidney disease Currently treated by lifestyle modification with fair control; initiate metformin for A1C of 7 Continue diet and exercise.  Perform daily foot/skin check, notify office of any concerning changes.  Check A1C  Overweight Long discussion about weight loss, diet, and exercise Recommended diet heavy in fruits and veggies and low in animal meats, cheeses, and dairy products, appropriate calorie intake Discussed ideal weight for height  Patient will work on continuing with dietary changes Will follow up in 3 months  Medication management Check mag, add on for constipation, increase water and exercise  Continue diet and meds as discussed. Further disposition pending results of labs. Discussed med's effects and SE's.   Over 30 minutes of exam, counseling, chart review, and critical decision making was performed.   Future Appointments  Date Time Provider [place]  09/23/2018  9:30 AM Narda Amber K, DO [facility] None  06/14/2019 10:45 AM Vicie Mutters, PA-C [id number] None    ----------------------------------------------------------------------------------------------------------------------  HPI 67 y.o. female  presents for 3 month follow up on hypertension, cholesterol, T2  diabetes with stage 2 CKD, weight and vitamin D deficiency.   BMI is Body mass index is 28.99 kg/m., she has been working on diet but not exercise. Yolanda Bonine is living with her now, he is 6, going to college and she cooks for him.  Wt Readings from Last 3 Encounters:  09/22/18 174 lb 3.2 oz (79 kg)  06/09/18 168 lb (76.2 kg)  03/23/18 167 lb 2 oz (75.8 kg)    Today their BP is BP: 122/60   She does not workout, her dog passed. She denies chest pain, shortness of breath, dizziness.    She is not on cholesterol medication and denies myalgias. She refuses all statin therapy.  Her cholesterol is not at goal. The cholesterol last visit was:   Lab Results  Component Value Date   CHOL 172 06/09/2018   HDL 42 (L) 06/09/2018   LDLCALC 109 (H) 06/09/2018   TRIG 103 06/09/2018   CHOLHDL 4.1 06/09/2018    She has been working on diet for T2 diabetes currently treated by lifestyle only, eye exam 11/27/2017, and denies foot ulcerations, increased appetite, nausea, polydipsia, polyuria, visual disturbances, vomiting and weight loss. She does not check blood glucose. Last A1C in the office was:  Lab Results  Component Value Date   HGBA1C 6.8 (H) 06/09/2018   She is on 4000 IU daily.  Lab Results  Component Value Date   VD25OH 58 08/01/2017       Current Medications:  Current Outpatient Medications on File Prior to Visit  Medication Sig  . aspirin EC 81 MG tablet Take 81 mg by mouth daily.  Marland Kitchen CRANBERRY EXTRACT PO Take 1 tablet by mouth daily.    . cyclobenzaprine (FLEXERIL) 10 MG tablet Take 1 tablet (10 mg  total) by mouth 3 (three) times daily as needed for muscle spasms. Take 1/2-1 tab  . Hydroxyamphetamine-Tropicamide (PAREMYD OP) Apply to eye.  Marland Kitchen ibuprofen (ADVIL,MOTRIN) 600 MG tablet Take 600 mg by mouth every morning.  Marland Kitchen lisinopril (PRINIVIL,ZESTRIL) 10 MG tablet TAKE 1 TABLET BY MOUTH DAILY FOR BLOOD PRESSURE  . Multiple Vitamins-Minerals (MULTIVITAMIN PO) Take by mouth daily.  .  Phenazopyridine HCl (AZO TABS PO) Take 2 tablets by mouth daily.  Marland Kitchen rOPINIRole (REQUIP) 3 MG tablet TAKE 1 TABLET BY MOUTH AT BEDTIME  . VITAMIN D, CHOLECALCIFEROL, PO Take 4,000 Units by mouth daily.    No current facility-administered medications on file prior to visit.      Allergies:  Allergies  Allergen Reactions  . Hctz [Hydrochlorothiazide]     Leg cramping  . Metformin And Related Nausea Only  . Phenylbutazones Swelling  . Tropicamide Swelling    Red /itching/watery eyes     Medical History:  Past Medical History:  Diagnosis Date  . Allergy    otc med prn  . Arthritis    hips, lower back  . Cataract    bilateral  . Chronic kidney disease    stage 3 per patient   . Diet-controlled type 2 diabetes mellitus (HCC)    Diet controlled, no meds  . Diverticulosis   . Dribbling urine   . EKG abnormalities    pt states she had a BBB on her last EKG, no problems  . Essential hypertension, benign 08/19/2013  . HPV in female   . Hyperlipidemia    diet controlled, no med  . Restless leg syndrome   . Snoring   . Vitamin D deficiency    Family history- Reviewed and unchanged Social history- Reviewed and unchanged   Review of Systems:  Review of Systems  Constitutional: Negative for malaise/fatigue and weight loss.  HENT: Negative for hearing loss and tinnitus.   Eyes: Negative for blurred vision and double vision.  Respiratory: Negative for cough, shortness of breath and wheezing.   Cardiovascular: Negative for chest pain, palpitations, orthopnea, claudication and leg swelling.  Gastrointestinal: Negative for abdominal pain, blood in stool, constipation, diarrhea, heartburn, melena, nausea and vomiting.  Genitourinary: Negative.   Musculoskeletal: Positive for back pain (Chronic). Negative for joint pain and myalgias.  Skin: Negative for rash.  Neurological: Negative for dizziness, tingling, sensory change, weakness and headaches.  Endo/Heme/Allergies: Negative for  polydipsia.  Psychiatric/Behavioral: Negative.  Negative for depression and memory loss. The patient is not nervous/anxious and does not have insomnia.   All other systems reviewed and are negative.    Physical Exam: BP 122/60   Pulse 68   Temp (!) 97.2 F (36.2 C)   Ht 5\' 5"  (1.651 m)   Wt 174 lb 3.2 oz (79 kg)   SpO2 98%   BMI 28.99 kg/m  Wt Readings from Last 3 Encounters:  09/22/18 174 lb 3.2 oz (79 kg)  06/09/18 168 lb (76.2 kg)  03/23/18 167 lb 2 oz (75.8 kg)   General Appearance: Well nourished, in no apparent distress. Eyes: PERRLA, EOMs, conjunctiva no swelling or erythema Sinuses: No Frontal/maxillary tenderness ENT/Mouth: Ext aud canals clear, TMs without erythema, bulging. No erythema, swelling, or exudate on post pharynx.  Tonsils not swollen or erythematous. Hearing normal.  Neck: Supple, thyroid normal.  Respiratory: Respiratory effort normal, BS equal bilaterally without rales, rhonchi, wheezing or stridor.  Cardio: RRR with no MRGs. Brisk peripheral pulses without edema.  Abdomen: Soft, + BS.  Non  tender, no guarding, rebound, hernias, masses. Lymphatics: Non tender without lymphadenopathy.  Musculoskeletal: Full ROM, 5/5 strength, Normal gait Skin: Warm, dry without rashes, lesions, ecchymosis.  Neuro: Cranial nerves intact. No cerebellar symptoms.  Psych: Awake and oriented X 3, normal affect, Insight and Judgment appropriate.    Vicie Mutters, PA-C 9:45 AM [facility]

## 2018-09-22 ENCOUNTER — Ambulatory Visit (INDEPENDENT_AMBULATORY_CARE_PROVIDER_SITE_OTHER): Payer: Medicare Other | Admitting: Physician Assistant

## 2018-09-22 ENCOUNTER — Encounter: Payer: Self-pay | Admitting: Physician Assistant

## 2018-09-22 VITALS — BP 122/60 | HR 68 | Temp 97.2°F | Ht 65.0 in | Wt 174.2 lb

## 2018-09-22 DIAGNOSIS — E1122 Type 2 diabetes mellitus with diabetic chronic kidney disease: Secondary | ICD-10-CM

## 2018-09-22 DIAGNOSIS — E1142 Type 2 diabetes mellitus with diabetic polyneuropathy: Secondary | ICD-10-CM

## 2018-09-22 DIAGNOSIS — I10 Essential (primary) hypertension: Secondary | ICD-10-CM | POA: Diagnosis not present

## 2018-09-22 DIAGNOSIS — N182 Chronic kidney disease, stage 2 (mild): Secondary | ICD-10-CM | POA: Diagnosis not present

## 2018-09-22 DIAGNOSIS — Z23 Encounter for immunization: Secondary | ICD-10-CM

## 2018-09-22 DIAGNOSIS — E782 Mixed hyperlipidemia: Secondary | ICD-10-CM

## 2018-09-22 DIAGNOSIS — Z79899 Other long term (current) drug therapy: Secondary | ICD-10-CM

## 2018-09-22 NOTE — Patient Instructions (Signed)
When it comes to diets, agreement about the perfect plan isn't easy to find, even among the experts. Experts at the [employer] developed an idea known as the Healthy Eating Plate. Just imagine a plate divided into logical, healthy portions.  The emphasis is on diet quality:  Load up on vegetables and fruits - one-half of your plate: Aim for color and variety, and remember that potatoes don't count.  Go for whole grains - one-quarter of your plate: Whole wheat, barley, wheat berries, quinoa, oats, brown rice, and foods made with them. If you want pasta, go with whole wheat pasta.  Protein power - one-quarter of your plate: Fish, chicken, beans, and nuts are all healthy, versatile protein sources. Limit red meat.  The diet, however, does go beyond the plate, offering a few other suggestions.  Use healthy plant oils, such as olive, canola, soy, corn, sunflower and peanut. Check the labels, and avoid partially hydrogenated oil, which have unhealthy trans fats.  If you're thirsty, drink water. Coffee and tea are good in moderation, but skip sugary drinks and limit milk and dairy products to one or two daily servings.  The type of carbohydrate in the diet is more important than the amount. Some sources of carbohydrates, such as vegetables, fruits, whole grains, and beans-are healthier than others.  Finally, stay active.   Google mindful eating and here are some tips and tricks below.   Rate your hunger before you eat on a scale of 1-10, try to eat closer to a 6 or higher. And if you are at below that, why are you eating? Slow down and listen to your body.        Diabetes or even increased sugars put you at 300% increased risk of heart attack and stroke.  ALSO BEING DIABETIC YOU MAY NOT HAVE ANY PAIN WITH A HEART ATTACK.  Even worse of a chance of no pain if you are a woman.  It is very unlikely that you will have any pain with a heart attack. Likely your symptoms  will be very subtle, even for very severe disease.  Your symptoms for a heart attack will likely occur when you exert your self or exercise and include: Shortness of breath Sweating Nausea Dizziness Fast or irregular heart beats Fatigue   It makes me feel better if my diabetics get their heart rate up with exercise once or twice a week and pay close attention to your body. If there is ANY change in your exercise capacity or if you have symptoms above, please STOP and call 911 or call to come to the office.   PLEASE REMEMBER:  Diabetes is preventable! Up to 5 percent of complications and morbidities among individuals with type 2 diabetes can be prevented, delayed, or effectively treated and minimized with regular visits to a health professional, appropriate monitoring and medication, and a healthy diet and lifestyle.   Here is some information to help you keep your heart healthy: Move it! - Aim for 30 mins of activity every day. Take it slowly at first. Talk to [name] before starting any new exercise program.   Lose it.  -Body Mass Index (BMI) can indicate if you need to lose weight. A healthy range is 18.5-24.9. For a BMI calculator, go to [employer]  Waist Management -Excess abdominal fat is a risk factor for heart disease, diabetes, asthma, stroke and more. Ideal waist circumference is less than 35" for women and less than 40" for  men.   Eat Right -focus on fruits, vegetables, whole grains, and meals you make yourself. Avoid foods with trans fat and high sugar/sodium content.   Snooze or Snore? - Loud snoring can be a sign of sleep apnea, a significant risk factor for high blood pressure, heart attach, stroke, and heart arrhythmias.  Kick the habit -Quit Smoking! Avoid second hand smoke. A single cigarette raises your blood pressure for 20 mins and increases the risk of heart attack and stroke for the next 24 hours.   Are Aspirin and Supplements right for you? -Add ENTERIC  COATED low dose 81 mg Aspirin daily OR can do every other day if you have easy bruising to protect your heart and head. As well as to reduce risk of Colon Cancer by 20 %, Skin Cancer by 26 % , Melanoma by 46% and Pancreatic cancer by 60%  Say "No to Stress -There may be little you can do about problems that cause stress. However, techniques such as long walks, meditation, and exercise can help you manage it.   Start Now! - Make changes one at a time and set reasonable goals to increase your likelihood of success.

## 2018-09-23 ENCOUNTER — Ambulatory Visit (INDEPENDENT_AMBULATORY_CARE_PROVIDER_SITE_OTHER): Payer: Medicare Other | Admitting: Neurology

## 2018-09-23 ENCOUNTER — Encounter: Payer: Self-pay | Admitting: Neurology

## 2018-09-23 VITALS — BP 118/64 | HR 68 | Ht 65.0 in | Wt 170.0 lb

## 2018-09-23 DIAGNOSIS — R200 Anesthesia of skin: Secondary | ICD-10-CM | POA: Diagnosis not present

## 2018-09-23 DIAGNOSIS — G2581 Restless legs syndrome: Secondary | ICD-10-CM

## 2018-09-23 LAB — HEMOGLOBIN A1C
Hgb A1c MFr Bld: 7.2 % of total Hgb — ABNORMAL HIGH (ref <5.7)
Mean Plasma Glucose: 160 (calc)
eAG (mmol/L): 8.9 (calc)

## 2018-09-23 LAB — LIPID PANEL
Cholesterol: 171 mg/dL (ref <200)
HDL: 40 mg/dL — ABNORMAL LOW (ref >50)
LDL Cholesterol (Calc): 106 mg/dL (calc) — ABNORMAL HIGH
Non-HDL Cholesterol (Calc): 131 mg/dL (calc) — ABNORMAL HIGH (ref <130)
Total CHOL/HDL Ratio: 4.3 (calc) (ref <5.0)
Triglycerides: 141 mg/dL (ref <150)

## 2018-09-23 LAB — COMPLETE METABOLIC PANEL WITH GFR
AG Ratio: 1.3 (calc) (ref 1.0–2.5)
ALT: 25 U/L (ref 6–29)
AST: 23 U/L (ref 10–35)
Albumin: 4.1 g/dL (ref 3.6–5.1)
Alkaline phosphatase (APISO): 70 U/L (ref 33–130)
BUN/Creatinine Ratio: 15 (calc) (ref 6–22)
BUN: 16 mg/dL (ref 7–25)
CO2: 29 mmol/L (ref 20–32)
Calcium: 9.5 mg/dL (ref 8.6–10.4)
Chloride: 105 mmol/L (ref 98–110)
Creat: 1.09 mg/dL — ABNORMAL HIGH (ref 0.50–0.99)
GFR, Est African American: 61 mL/min/{1.73_m2} (ref >=60)
GFR, Est Non African American: 52 mL/min/{1.73_m2} — ABNORMAL LOW (ref >=60)
Globulin: 3.2 g/dL (calc) (ref 1.9–3.7)
Glucose, Bld: 92 mg/dL (ref 65–99)
Potassium: 4.3 mmol/L (ref 3.5–5.3)
Sodium: 140 mmol/L (ref 135–146)
Total Bilirubin: 0.5 mg/dL (ref 0.2–1.2)
Total Protein: 7.3 g/dL (ref 6.1–8.1)

## 2018-09-23 LAB — CBC WITH DIFFERENTIAL/PLATELET
Basophils Absolute: 28 cells/uL (ref 0–200)
Basophils Relative: 0.5 %
Eosinophils Absolute: 230 cells/uL (ref 15–500)
Eosinophils Relative: 4.1 %
HEMATOCRIT: 39.9 % (ref 35.0–45.0)
Hemoglobin: 13.6 g/dL (ref 11.7–15.5)
LYMPHS ABS: 1646 {cells}/uL (ref 850–3900)
MCH: 30 pg (ref 27.0–33.0)
MCHC: 34.1 g/dL (ref 32.0–36.0)
MCV: 87.9 fL (ref 80.0–100.0)
MPV: 9.8 fL (ref 7.5–12.5)
Monocytes Relative: 5.9 %
Neutro Abs: 3366 cells/uL (ref 1500–7800)
Neutrophils Relative %: 60.1 %
Platelets: 194 10*3/uL (ref 140–400)
RBC: 4.54 10*6/uL (ref 3.80–5.10)
RDW: 12.9 % (ref 11.0–15.0)
Total Lymphocyte: 29.4 %
WBC: 5.6 10*3/uL (ref 3.8–10.8)
WBCMIX: 330 {cells}/uL (ref 200–950)

## 2018-09-23 LAB — TSH: TSH: 1.39 mIU/L (ref 0.40–4.50)

## 2018-09-23 NOTE — Patient Instructions (Signed)
Return to clinic in 1 year.

## 2018-09-23 NOTE — Progress Notes (Signed)
Follow-up Visit   Date: 09/23/18    Rachel Daniel MRN: [id number] DOB: [birth year]   Interim History: Rachel Daniel is a 67 y.o. right-handed Caucasian female with diabetes mellitus (HbA1c 6.5), hypertension, RLS returning to the clinic for follow-up of restless leg syndrome and right foot numbness.  The patient was accompanied to the clinic by self.   History of present illness: Starting around her 12s, she recalls having achy pain of the right thigh, which would prohibit her from falling asleep.  Her husband often complained that she would kick him all night long.  She has squeezing, stabbing, knife-like pain of bilateral thighs. The lower legs and feet are spared.  She only has relief with walking and stretching.  Pain was severe at night time, but over the past several years started having daytime symptoms and she cannot get relief with anything. Her pain is better on the weekends when she is less sedentary.  She was diagnosed with RLS in her 50s and started on ropinorole and takes 3mg  at bedtime which provides relief until lunch time.  She is unable to take ropinorole during the day because of sedation.  She is intolerant to Lyrica.  She denies any weakness.    She also complains of numbness of the right great toe since her 109s.  This has been constant and there is no tingling or pain.  She denies weakness.  Symptoms have not progressed and do not bother her.  She does not have similar symptoms on the left foot.  UPDATE 09/23/2018:  She is here for 6 month follow-up visit.  She continues to have RLS symptoms in the evening, but does not want to make changes to her ropinirole due to sedation.  She takes 0.5mg  at 3pm and 3mg  at 8pm.  Discomfort does not wake her up from sleeping.  She has some relief with using Aspercream to her legs for RLS.  She no longer has weakness or numbness of the right foot, which improved after taking flexeril.   She does not consume caffeine in the evenings.   She likes to stay active with outdoor chores and gardening.     Medications:  Current Outpatient Medications on File Prior to Visit  Medication Sig Dispense Refill  . rOPINIRole (REQUIP) 0.5 MG tablet Take 0.5 mg by mouth. Take 0.5mg  at 3pm.    . aspirin EC 81 MG tablet Take 81 mg by mouth daily.    Marland Kitchen CRANBERRY EXTRACT PO Take 1 tablet by mouth daily.      . cyclobenzaprine (FLEXERIL) 10 MG tablet Take 1 tablet (10 mg total) by mouth 3 (three) times daily as needed for muscle spasms. Take 1/2-1 tab 90 tablet 1  . Hydroxyamphetamine-Tropicamide (PAREMYD OP) Apply to eye.    Marland Kitchen ibuprofen (ADVIL,MOTRIN) 600 MG tablet Take 600 mg by mouth every morning.    Marland Kitchen lisinopril (PRINIVIL,ZESTRIL) 10 MG tablet TAKE 1 TABLET BY MOUTH DAILY FOR BLOOD PRESSURE 90 tablet 1  . Multiple Vitamins-Minerals (MULTIVITAMIN PO) Take by mouth daily.    . Phenazopyridine HCl (AZO TABS PO) Take 2 tablets by mouth daily.    Marland Kitchen rOPINIRole (REQUIP) 3 MG tablet TAKE 1 TABLET BY MOUTH AT BEDTIME 90 tablet 1  . VITAMIN D, CHOLECALCIFEROL, PO Take 4,000 Units by mouth daily.      No current facility-administered medications on file prior to visit.     Allergies:  Allergies  Allergen Reactions  . Hctz [Hydrochlorothiazide]  Leg cramping  . Metformin And Related Nausea Only  . Phenylbutazones Swelling  . Tropicamide Swelling    Red /itching/watery eyes    Review of Systems:  CONSTITUTIONAL: No fevers, chills, night sweats, or weight loss.  EYES: No visual changes or eye pain ENT: No hearing changes.  No history of nose bleeds.   RESPIRATORY: No cough, wheezing and shortness of breath.   CARDIOVASCULAR: Negative for chest pain, and palpitations.   GI: Negative for abdominal discomfort, blood in stools or black stools.  No recent change in bowel habits.   GU:  No history of incontinence.   MUSCLOSKELETAL: No history of joint pain or swelling.  No myalgias.   SKIN: Negative for lesions, rash, and itching.    ENDOCRINE: Negative for cold or heat intolerance, polydipsia or goiter.   PSYCH:  No depression or anxiety symptoms.   NEURO: As Above.   Vital Signs:  BP 118/64   Pulse 68   Ht 5\' 5"  (1.651 m)   Wt 170 lb (77.1 kg)   SpO2 98%   BMI 28.29 kg/m    General Medical Exam:   General:  Well appearing, comfortable  Eyes/ENT: see cranial nerve examination.   Neck: No masses appreciated.  Full range of motion without tenderness.  No carotid bruits. Respiratory:  Clear to auscultation, good air entry bilaterally.   Cardiac:  Regular rate and rhythm, no murmur.   Ext:  No edema  Neurological Exam: MENTAL STATUS including orientation to time, place, person, and recent and remote memory is normal.  Speech is not dysarthric.  CRANIAL NERVES:  Face is symmetric.   MOTOR:  No atrophy, fasciculations or abnormal movements.  No pronator drift.  Tone is normal.    Right Upper Extremity:    Left Upper Extremity:    Deltoid  5/5   Deltoid  5/5   Biceps  5/5   Biceps  5/5   Triceps  5/5   Triceps  5/5   Wrist extensors  5/5   Wrist extensors  5/5   Wrist flexors  5/5   Wrist flexors  5/5   Finger extensors  5/5   Finger extensors  5/5   Finger flexors  5/5   Finger flexors  5/5   Dorsal interossei  5/5   Dorsal interossei  5/5   Abductor pollicis  5/5   Abductor pollicis  5/5   Tone (Ashworth scale)  0  Tone (Ashworth scale)  0   Right Lower Extremity:    Left Lower Extremity:    Hip flexors  5/5   Hip flexors  5/5   Hip extensors  5/5   Hip extensors  5/5   Knee flexors  5/5   Knee flexors  5/5   Knee extensors  5/5   Knee extensors  5/5   Dorsiflexors  5/5   Dorsiflexors  5/5   Plantarflexors  5/5   Plantarflexors  5/5   Toe extensors  5/5  Toe extensors  5/5   Toe flexors  5/5   Toe flexors  5/5   Tone (Ashworth scale)  0  Tone (Ashworth scale)  0   MSRs:  Reflexes are 2+/4, except absent right Achilles  SENSORY:  Reduced vibration at the great toe on the right, normal at the  ankle (improved).  Vibration normal on the left leg/foot and arms.  COORDINATION/GAIT:  Gait narrow based and stable.   Data: Lab Results  Component Value Date   FERRITIN 100 10/10/2016  Lab Results  Component Value Date   TSH 1.39 09/22/2018   Lab Results  Component Value Date   EXMDYJWL29 574 04/06/2015    IMPRESSION/PLAN:  1.  Restless leg syndrome, still with discomfort in the evenings, no worse than before.  She takes ropinirole 0.5mg  at 3pm and 3mg  at 8pm.  She gets very sleepy with higher dose and does not want to make any changes.  Lifestyle modification and driving precautions discussed.   2.  Right foot numbness - improved.  Exam shows improved right dorsiflexion and hip flexion.  Continue to follow.  Return to clinic in 1 year   Thank you for allowing me to participate in patient's care.  If I can answer any additional questions, I would be pleased to do so.    Sincerely,    Donika K. Posey Pronto, DO

## 2019-01-12 ENCOUNTER — Encounter: Payer: Self-pay | Admitting: Internal Medicine

## 2019-01-12 NOTE — Progress Notes (Signed)
THIS ENCOUNTER IS A VIRTUAL VISIT DUE TO COVID-19 - PATIENT WAS NOT SEEN IN THE OFFICE.  PATIENT HAS CONSENTED TO VIRTUAL VISIT / TELEMEDICINE VISIT   Virtual Visit via telephone Note  I connected with@ on 01/13/19  by telephone. From  9:30 am - 9:56 am I verified that I am speaking with the correct person using two identifiers.        I discussed the limitations of evaluation and management by telemedicine and the availability of in person appointments. The patient expressed understanding and agreed to proceed.  History of Present Illness:      This very nice 68 y.o.  [place] interviewed  for 3 month follow up with HTN, HLD, T2_NIDDM and Vitamin D Deficiency. Patient also has been symptomatic with RLS in the past.      Patient has been treated for HTN & BP has been controlled at home. Today's BP is at goal -  131/68. Patient has had no complaints of any cardiac type chest pain, palpitations, dyspnea / orthopnea / PND, dizziness, claudication, or dependent edema. She's staying physically active around her home & push mowing her yard.       Hyperlipidemia is controlled with diet & meds. Patient denies myalgias or other med SE's. Last Lipids were not at goal: Lab Results  Component Value Date   CHOL 171 09/22/2018   HDL 40 (L) 09/22/2018   LDLCALC 106 (H) 09/22/2018   TRIG 141 09/22/2018   CHOLHDL 4.3 09/22/2018        Also, the patient has history of T2_NIDDM since 2001  (A1c  6.5% / 2016 and 6.8% / 2016) and has had no symptoms of reactive hypoglycemia, diabetic polys, paresthesias or visual blurring.  Patient has been unsuccessful in controlling her Diabetes with diet and last A1c was not at goal. With improved diet she has lost 4# over the last 4 months & her goal is to lose to 160# over the next 3-4 months: Lab Results  Component Value Date   HGBA1C 7.2 (H) 09/22/2018   Wt Readings from Last 3 Encounters:  01/13/19 166 lb (75.3 kg)  09/23/18 170 lb (77.1 kg)  09/22/18 174 lb  3.2 oz (79 kg)         Further, the patient also has history of Vitamin D Deficiency and supplements vitamin D without any suspected side-effects. Last vitamin D was near goal: Lab Results  Component Value Date   VD25OH 58 08/01/2017   Current Outpatient Medications on File Prior to Visit  Medication Sig  . aspirin EC 81 MG tablet Take 81 mg by mouth daily.  Marland Kitchen CRANBERRY EXTRACT PO Take 2 tablets by mouth daily.   . cyclobenzaprine (FLEXERIL) 10 MG tablet Take 1 tablet (10 mg total) by mouth 3 (three) times daily as needed for muscle spasms. Take 1/2-1 tab  . ibuprofen (ADVIL,MOTRIN) 600 MG tablet Take 600 mg by mouth every morning.  Marland Kitchen lisinopril (PRINIVIL,ZESTRIL) 10 MG tablet TAKE 1 TABLET BY MOUTH DAILY FOR BLOOD PRESSURE  . Multiple Vitamins-Minerals (MULTIVITAMIN PO) Take by mouth daily.  Marland Kitchen OVER THE COUNTER MEDICATION Uses OTC eye drops for dry eyes.  . Phenazopyridine HCl (AZO TABS PO) Take 2 tablets by mouth daily.  Marland Kitchen rOPINIRole (REQUIP) 0.5 MG tablet Take 0.5 mg by mouth. Take 0.5mg  at 3pm.  . rOPINIRole (REQUIP) 3 MG tablet TAKE 1 TABLET BY MOUTH AT BEDTIME  . VITAMIN D, CHOLECALCIFEROL, PO Take 4,000 Units by mouth daily.  No current facility-administered medications on file prior to visit.    Allergies  Allergen Reactions  . Hctz [Hydrochlorothiazide]     Leg cramping  . Metformin And Related Nausea Only  . Phenylbutazones Swelling  . Tropicamide Swelling    Red /itching/watery eyes   PMHx:   Past Medical History:  Diagnosis Date  . Allergy    otc med prn  . Arthritis    hips, lower back  . Cataract    bilateral  . Chronic kidney disease    stage 3 per patient   . Diet-controlled type 2 diabetes mellitus (HCC)    Diet controlled, no meds  . Diverticulosis   . Dribbling urine   . EKG abnormalities    pt states she had a BBB on her last EKG, no problems  . Essential hypertension, benign 08/19/2013  . HPV in female   . Hyperlipidemia    diet controlled, no  med  . Restless leg syndrome   . Snoring   . Vitamin D deficiency    Immunization History  Administered Date(s) Administered  . Influenza, High Dose Seasonal PF 08/01/2017, 09/22/2018  . Pneumococcal Conjugate-13 04/05/2016  . Pneumococcal Polysaccharide-23 04/15/2017  . Tdap 03/07/2011   Past Surgical History:  Procedure Laterality Date  . BREAST BIOPSY Left 1969   left lumpectomy - benign  . COLONOSCOPY  2012   kaplan-polyps, tics  . LUMBAR LAMINECTOMY/DECOMPRESSION MICRODISCECTOMY  05/07/2012   Procedure: LUMBAR LAMINECTOMY/DECOMPRESSION MICRODISCECTOMY;  Surgeon: Sinclair Ship, MD;  Location: [facility];  Service: Orthopedics;  Laterality: Bilateral;  Lumbar 4-5 decompression  . OVARIAN CYST REMOVAL  1981   right  . TUBAL LIGATION  1981  . VAGINAL HYSTERECTOMY  1992   FHx:    Reviewed / unchanged  SHx:    Reviewed / unchanged   Systems Review:  Constitutional: Denies fever, chills, wt changes, headaches, insomnia, fatigue, night sweats, change in appetite. Eyes: neg ENT: neg CV:neg Respiratory: denies cough, dyspnea, DOE, pleurisy, hoarseness, laryngitis, wheezing.  Gastrointestinal: neg  Musculoskeletal: neg Skin: neg Neuro: neg Psychiatric: neg Endo: neg  Physical Exam  BP 131/68   Pulse 72   Temp 98.6 F (37 C)   Wt 166 lb (75.3 kg)   BMI 27.62 kg/m   Speech is fluent, not labored without stridor or hoarseness or apparent halting speech or coughing.    Alert & oriented x 3.  Insight and judgement nl & appropriate. No ideations.  Assessment and Plan:  1. Essential hypertension, benign  - Continue medication, - continue to monitor  blood pressure at home.  - Monitor appropriate labs when quarantine restrictions allow   Reminder to go to the ER if any CP,  - SOB, nausea, dizziness, severe HA, changes vision/speech.  2. Hyperlipidemia, mixed  - Continue diet, exercise,& lifestyle modifications.  - Continue monitor periodic cholesterol when  quarantine restrictions allow  - discussed low chol diet in detail  3. Controlled type 2 diabetes mellitus with stage 2 chronic kidney disease, without long-term current use of insulin (HCC)  - Continue diet  & weight lossexercise  - Monitor appropriate labs when quarantine restrictions allow   4. Vitamin D deficiency  - Continue supplementation  5. Medication management  - Monitor appropriate labs when quarantine restrictions allow        Discussed  regular exercise, BP monitoring, weight control to achieve/maintain BMI less than 25 and discussed med and SE's. Recommended labs to assess and monitor clinical status - Monitor appropriate labs  when quarantine restrictions allow . Over 30 minutes of exam, counseling, chart review was performed.        I discussed the assessment and treatment plan with the patient. The patient was provided an opportunity to ask questions and all were answered. The patient agreed with the plan and demonstrated an understanding of the instructions.        The patient was advised to call back or seek an in-person evaluation if the symptoms worsen or if the condition fails to improve as anticipated.       I provided 26 minutes of non-face-to-face time during this encounter and over 40 minutes of counseling, chart review and  complex critical decision making was performed  Kirtland Bouchard, MD

## 2019-01-12 NOTE — Patient Instructions (Signed)

## 2019-01-13 ENCOUNTER — Other Ambulatory Visit: Payer: Self-pay

## 2019-01-13 ENCOUNTER — Ambulatory Visit: Payer: Medicare Other | Admitting: Internal Medicine

## 2019-01-13 VITALS — BP 131/68 | HR 72 | Temp 98.6°F | Wt 166.0 lb

## 2019-01-13 DIAGNOSIS — E782 Mixed hyperlipidemia: Secondary | ICD-10-CM

## 2019-01-13 DIAGNOSIS — E1122 Type 2 diabetes mellitus with diabetic chronic kidney disease: Secondary | ICD-10-CM | POA: Diagnosis not present

## 2019-01-13 DIAGNOSIS — I10 Essential (primary) hypertension: Secondary | ICD-10-CM | POA: Diagnosis not present

## 2019-01-13 DIAGNOSIS — E559 Vitamin D deficiency, unspecified: Secondary | ICD-10-CM | POA: Diagnosis not present

## 2019-01-13 DIAGNOSIS — N182 Chronic kidney disease, stage 2 (mild): Secondary | ICD-10-CM | POA: Diagnosis not present

## 2019-01-13 DIAGNOSIS — Z79899 Other long term (current) drug therapy: Secondary | ICD-10-CM | POA: Diagnosis not present

## 2019-01-26 ENCOUNTER — Other Ambulatory Visit: Payer: Self-pay | Admitting: Adult Health

## 2019-02-04 ENCOUNTER — Other Ambulatory Visit: Payer: Self-pay | Admitting: Physician Assistant

## 2019-02-04 DIAGNOSIS — G2581 Restless legs syndrome: Secondary | ICD-10-CM

## 2019-04-13 NOTE — Progress Notes (Signed)
CPE  Assessment and Plan: Essential hypertension, benign - continue medications, DASH diet, exercise and monitor at home. Call if greater than 130/80. - GET ON AND STAY ON LISINOPRIL  - CBC with Differential/Platelet - BASIC METABOLIC PANEL WITH GFR - Hepatic function panel - TSH   Restless leg syndrome Continue requip   Hyperlipidemia -continue medications, check lipids, decrease fatty foods, increase activity.  - Lipid panel   Diverticulosis of large intestine without hemorrhage Remission, increase fiber  Vitamin D deficiency - Vit D  25 hydroxy (rtn osteoporosis monitoring)   Medication management - Magnesium  Gastroesophageal reflux disease with esophagitis Continue PPI/H2 blocker, diet discussed  Diabetic autonomic neuropathy associated with type 2 diabetes mellitus Do daily feet checks  Controlled type 2 diabetes mellitus with stage 2 chronic kidney disease, without long-term current use of insulin (HCC) -     COMPLETE METABOLIC PANEL WITH GFR -     Hemoglobin A1c  CKD stage 2 due to type 2 diabetes mellitus (HCC) -     COMPLETE METABOLIC PANEL WITH GFR -     Hemoglobin A1c  Overweight (BMI 25.0-29.9)    Discussed med's effects and SE's. Screening labs and tests as requested with regular follow-up as recommended.  Future Appointments  Date Time Provider [place]  07/20/2019 10:00 AM Vicie Mutters, PA-C [facility] None  09/24/2019  9:30 AM Narda Amber K, DO [facility] None  12/13/2019  9:15 AM Bernarda Caffey, MD [facility] None  04/20/2020 10:00 AM Vicie Mutters, PA-C [facility] None    HPI 68 y.o. female  presents for a complete physical.  She retired May 2nd 2018, states she is doing well.  She had to put her dog down in sept 2019. She crochets, does word puzzles, stays active outside[place]  BMI is Body mass index is 27.92 kg/m., she is working on diet and exercise. Wt Readings from Last 3 Encounters:   04/15/19 170 lb 6.4 oz (77.3 kg)  01/13/19 166 lb (75.3 kg)  09/23/18 170 lb (77.1 kg)    Her blood pressure has been controlled at home, today their BP is BP: 126/74 She does workout. She denies chest pain, shortness of breath, dizziness.  She is not on cholesterol medication and denies myalgias. Her cholesterol is at goal. The cholesterol last visit was:   Lab Results  Component Value Date   CHOL 171 09/22/2018   HDL 40 (L) 09/22/2018   LDLCALC 106 (H) 09/22/2018   TRIG 141 09/22/2018   CHOLHDL 4.3 09/22/2018   She has been working on diet and exercise for diabetes with CKD stage II but could not tolerate the metformin, she is on bASA, she is on an ACE, and denies polydipsia, polyuria and visual disturbances.  Last A1C in the office was:  Lab Results  Component Value Date   HGBA1C 7.2 (H) 09/22/2018   Lab Results  Component Value Date   GFRNONAA 52 (L) 09/22/2018   Patient is on Vitamin D supplement.   Lab Results  Component Value Date   VD25OH 83 08/01/2017   She has GERD She is on requip for RLS which helps, she follows with Dr. Posey Pronto, on 3 at night and occ 0.25mg  during the day    Current Medications:  Current Outpatient Medications on File Prior to Visit  Medication Sig  . aspirin EC 81 MG tablet Take 81 mg by mouth daily.  Marland Kitchen CRANBERRY EXTRACT PO Take 2 tablets by mouth daily.   Marland Kitchen  cyclobenzaprine (FLEXERIL) 10 MG tablet Take 1 tablet (10 mg total) by mouth 3 (three) times daily as needed for muscle spasms. Take 1/2-1 tab  . ibuprofen (ADVIL,MOTRIN) 600 MG tablet Take 600 mg by mouth every morning.  Marland Kitchen lisinopril (PRINIVIL,ZESTRIL) 10 MG tablet TAKE 1 TABLET BY MOUTH DAILY FOR BLOOD PRESSURE  . Multiple Vitamins-Minerals (MULTIVITAMIN PO) Take by mouth daily.  Marland Kitchen OVER THE COUNTER MEDICATION Uses OTC eye drops for dry eyes.  . Phenazopyridine HCl (AZO TABS PO) Take 2 tablets by mouth daily.  Marland Kitchen rOPINIRole (REQUIP) 0.5 MG tablet Take 0.5 mg by mouth. Take 0.5mg  at 3pm.   . rOPINIRole (REQUIP) 3 MG tablet TAKE 1 TABLET BY MOUTH AT BEDTIME  . VITAMIN D, CHOLECALCIFEROL, PO Take 4,000 Units by mouth daily.    No current facility-administered medications on file prior to visit.    Health Maintenance:   Immunization History  Administered Date(s) Administered  . Influenza, High Dose Seasonal PF 08/01/2017, 09/22/2018  . Pneumococcal Conjugate-13 04/05/2016  . Pneumococcal Polysaccharide-23 04/15/2017  . Tdap 03/07/2011   Tetanus: 2012 Pneumovax: 2018 Prevnar 13: 2017 Flu vaccine: 2019 Zostavax: declines  Pap: 2016 negative MGM: 04/2018  DEXA: 2008 normal Colonoscopy: 06/2016, Dr. Deatra Ina EGD: N/A MRI lumbar spine 2013 CXR 2017 Eye exam: Dr. Rodman Key 11/2017- due 2 years Dentist: Dr. Harrington Challenger q 6 months 11/2016 Dr. Lynann Bologna ortho  Allergies:  Allergies  Allergen Reactions  . Hctz [Hydrochlorothiazide]     Leg cramping  . Metformin And Related Nausea Only  . Phenylbutazones Swelling  . Tropicamide Swelling    Red /itching/watery eyes   Medical History:  Past Medical History:  Diagnosis Date  . Allergy    otc med prn  . Arthritis    hips, lower back  . Cataract    bilateral  . Chronic kidney disease    stage 3 per patient   . Diet-controlled type 2 diabetes mellitus (HCC)    Diet controlled, no meds  . Diverticulosis   . Dribbling urine   . EKG abnormalities    pt states she had a BBB on her last EKG, no problems  . Essential hypertension, benign 08/19/2013  . HPV in female   . Hyperlipidemia    diet controlled, no med  . Restless leg syndrome   . Snoring   . Vitamin D deficiency    Surgical History:  She  has a past surgical history that includes Vaginal hysterectomy (1992); Tubal ligation (1981); Ovarian cyst removal (1981); Lumbar laminectomy/decompression microdiscectomy (05/07/2012); Colonoscopy (2012); and Breast biopsy (Left, 1969).  Family History:  Her family history includes Atrial fibrillation in her brother, brother,  sister, and sister; Cataracts in her mother; Colon cancer (age of onset: 11) in her mother; Colon polyps in her brother and sister; Diabetes in her mother, sister, and sister; Heart failure in her mother; Heart failure (age of onset: 20) in her father; Hypertension in her mother; Stroke in her mother; Stroke (age of onset: 39) in her sister.   Social History:  She  reports that she is a non-smoker but has been exposed to tobacco smoke. She has never used smokeless tobacco. She reports that she does not drink alcohol or use drugs.   Review of Systems  Constitutional: Negative.   HENT: Negative.   Eyes: Negative.  Negative for blurred vision.  Respiratory: Negative for cough, hemoptysis, sputum production, shortness of breath and wheezing.   Cardiovascular: Negative.  Negative for chest pain.  Gastrointestinal: Negative.   Genitourinary:  Negative.        Stress incontinence  Musculoskeletal: Positive for myalgias (bilateral legs). Negative for back pain, falls, joint pain and neck pain.  Skin: Negative for itching and rash.  Neurological: Positive for tingling and sensory change (bilateral feet).  Psychiatric/Behavioral: Negative.    Physical Exam: Estimated body mass index is 27.92 kg/m as calculated from the following:   Height as of this encounter: 5' 5.5" (1.664 m).   Weight as of this encounter: 170 lb 6.4 oz (77.3 kg). BP 126/74   Pulse 72   Temp (!) 97 F (36.1 C)   Ht 5' 5.5" (1.664 m) Comment: with shoes  Wt 170 lb 6.4 oz (77.3 kg)   SpO2 97%   BMI 27.92 kg/m  General Appearance: Well nourished, in no apparent distress. Eyes: PERRLA, EOMs, conjunctiva no swelling or erythema, normal fundi and vessels. Sinuses: No Frontal/maxillary tenderness ENT/Mouth: Ext aud canals clear, normal light reflex with TMs without erythema, bulging.  Good dentition. No erythema, swelling, or exudate on post pharynx. Tonsils not swollen or erythematous. Hearing normal.  Neck: Supple, thyroid  normal. No bruits Respiratory: Respiratory effort normal, BS equal bilaterally without rales, rhonchi, wheezing or stridor. Cardio: RRR without murmurs, rubs or gallops. Brisk peripheral pulses without edema.  Chest: symmetric, with normal excursions and percussion. Breasts: Symmetric, without lumps, nipple discharge, retractions. Abdomen: Soft, +BS, Non tender, no guarding, rebound, hernias, masses, or organomegaly. .  Lymphatics: Non tender without lymphadenopathy.  Musculoskeletal: Full ROM all peripheral extremities,5/5 strength, and normal gait.  Skin: + varicose veins bilateral legs. Warm, dry without rashes, lesions, ecchymosis.  Neuro: Cranial nerves intact, reflexes equal bilaterally. Normal muscle tone, no cerebellar symptoms. Sensation decreased bilateral feet to mid shin.  Psych: Awake and oriented X 3, normal affect, Insight and Judgment appropriate.   Vicie Mutters 10:15 AM

## 2019-04-15 ENCOUNTER — Ambulatory Visit (INDEPENDENT_AMBULATORY_CARE_PROVIDER_SITE_OTHER): Payer: Medicare Other | Admitting: Physician Assistant

## 2019-04-15 ENCOUNTER — Other Ambulatory Visit: Payer: Self-pay

## 2019-04-15 ENCOUNTER — Encounter: Payer: Self-pay | Admitting: Physician Assistant

## 2019-04-15 VITALS — BP 126/74 | HR 72 | Temp 97.0°F | Ht 65.5 in | Wt 170.4 lb

## 2019-04-15 DIAGNOSIS — Z79899 Other long term (current) drug therapy: Secondary | ICD-10-CM

## 2019-04-15 DIAGNOSIS — E782 Mixed hyperlipidemia: Secondary | ICD-10-CM

## 2019-04-15 DIAGNOSIS — E1122 Type 2 diabetes mellitus with diabetic chronic kidney disease: Secondary | ICD-10-CM

## 2019-04-15 DIAGNOSIS — G2581 Restless legs syndrome: Secondary | ICD-10-CM

## 2019-04-15 DIAGNOSIS — K5730 Diverticulosis of large intestine without perforation or abscess without bleeding: Secondary | ICD-10-CM

## 2019-04-15 DIAGNOSIS — N182 Chronic kidney disease, stage 2 (mild): Secondary | ICD-10-CM

## 2019-04-15 DIAGNOSIS — E1142 Type 2 diabetes mellitus with diabetic polyneuropathy: Secondary | ICD-10-CM | POA: Diagnosis not present

## 2019-04-15 DIAGNOSIS — E559 Vitamin D deficiency, unspecified: Secondary | ICD-10-CM | POA: Diagnosis not present

## 2019-04-15 DIAGNOSIS — E663 Overweight: Secondary | ICD-10-CM | POA: Diagnosis not present

## 2019-04-15 DIAGNOSIS — I10 Essential (primary) hypertension: Secondary | ICD-10-CM | POA: Diagnosis not present

## 2019-04-15 DIAGNOSIS — K2100 Gastro-esophageal reflux disease with esophagitis, without bleeding: Secondary | ICD-10-CM

## 2019-04-15 NOTE — Patient Instructions (Signed)
GENERAL HEALTH GOALS  Know what a healthy weight is for you (roughly BMI <25) and aim to maintain this  Aim for 7+ servings of fruits and vegetables daily  70-80+ fluid ounces of water or unsweet tea for healthy kidneys  Limit to max 1 drink of alcohol per day; avoid smoking/tobacco  Limit animal fats in diet for cholesterol and heart health - choose grass fed whenever available  Avoid highly processed foods, and foods high in saturated/trans fats  Aim for low stress - take time to unwind and care for your mental health  Aim for 150 min of moderate intensity exercise weekly for heart health, and weights twice weekly for bone health  Aim for 7-9 hours of sleep daily    Diabetes or even increased sugars put you at 300% increased risk of heart attack and stroke.  ALSO BEING DIABETIC YOU MAY NOT HAVE ANY PAIN WITH A HEART ATTACK.  Even worse of a chance of no pain if you are a woman.  It is very unlikely that you will have any pain with a heart attack. Likely your symptoms will be very subtle, even for very severe disease.  Your symptoms for a heart attack will likely occur when you exert your self or exercise and include: Shortness of breath Sweating Nausea Dizziness Fast or irregular heart beats Fatigue   It makes me feel better if my diabetics get their heart rate up with exercise once or twice a week and pay close attention to your body. If there is ANY change in your exercise capacity or if you have symptoms above, please STOP and call 911 or call to come to the office.   PLEASE REMEMBER:  Diabetes is preventable! Up to 25 percent of complications and morbidities among individuals with type 2 diabetes can be prevented, delayed, or effectively treated and minimized with regular visits to a health professional, appropriate monitoring and medication, and a healthy diet and lifestyle.   Here is some information to help you keep your heart healthy: Move it! - Aim for 30 mins of  activity every day. Take it slowly at first. Talk to [name] before starting any new exercise program.   Lose it.  -Body Mass Index (BMI) can indicate if you need to lose weight. A healthy range is 18.5-24.9. For a BMI calculator, go to [employer]  Waist Management -Excess abdominal fat is a risk factor for heart disease, diabetes, asthma, stroke and more. Ideal waist circumference is less than 35" for women and less than 40" for men.   Eat Right -focus on fruits, vegetables, whole grains, and meals you make yourself. Avoid foods with trans fat and high sugar/sodium content.   Snooze or Snore? - Loud snoring can be a sign of sleep apnea, a significant risk factor for high blood pressure, heart attach, stroke, and heart arrhythmias.  Kick the habit -Quit Smoking! Avoid second hand smoke. A single cigarette raises your blood pressure for 20 mins and increases the risk of heart attack and stroke for the next 24 hours.   Are Aspirin and Supplements right for you? -Add ENTERIC COATED low dose 81 mg Aspirin daily OR can do every other day if you have easy bruising to protect your heart and head. As well as to reduce risk of Colon Cancer by 20 %, Skin Cancer by 26 % , Melanoma by 46% and Pancreatic cancer by 60%  Say "No to Stress -There may be little you can do about problems that cause  stress. However, techniques such as long walks, meditation, and exercise can help you manage it.   Start Now! - Make changes one at a time and set reasonable goals to increase your likelihood of success.

## 2019-04-16 LAB — COMPLETE METABOLIC PANEL WITH GFR
AG Ratio: 1.5 (calc) (ref 1.0–2.5)
ALT: 17 U/L (ref 6–29)
AST: 17 U/L (ref 10–35)
Albumin: 4.3 g/dL (ref 3.6–5.1)
Alkaline phosphatase (APISO): 67 U/L (ref 37–153)
BUN: 19 mg/dL (ref 7–25)
CO2: 27 mmol/L (ref 20–32)
Calcium: 9.4 mg/dL (ref 8.6–10.4)
Chloride: 102 mmol/L (ref 98–110)
Creat: 0.97 mg/dL (ref 0.50–0.99)
GFR, Est African American: 70 mL/min/{1.73_m2} (ref >=60)
GFR, Est Non African American: 60 mL/min/{1.73_m2} (ref >=60)
Globulin: 2.8 g/dL (calc) (ref 1.9–3.7)
Glucose, Bld: 91 mg/dL (ref 65–99)
Potassium: 4.6 mmol/L (ref 3.5–5.3)
Sodium: 138 mmol/L (ref 135–146)
Total Bilirubin: 0.3 mg/dL (ref 0.2–1.2)
Total Protein: 7.1 g/dL (ref 6.1–8.1)

## 2019-04-16 LAB — CBC WITH DIFFERENTIAL/PLATELET
Absolute Monocytes: 310 cells/uL (ref 200–950)
Basophils Absolute: 20 cells/uL (ref 0–200)
Basophils Relative: 0.4 %
Eosinophils Absolute: 200 cells/uL (ref 15–500)
Eosinophils Relative: 4 %
HCT: 40.3 % (ref 35.0–45.0)
Hemoglobin: 13.3 g/dL (ref 11.7–15.5)
Lymphs Abs: 1660 cells/uL (ref 850–3900)
MCH: 29.2 pg (ref 27.0–33.0)
MCHC: 33 g/dL (ref 32.0–36.0)
MCV: 88.4 fL (ref 80.0–100.0)
MPV: 9.5 fL (ref 7.5–12.5)
Monocytes Relative: 6.2 %
Neutro Abs: 2810 cells/uL (ref 1500–7800)
Neutrophils Relative %: 56.2 %
Platelets: 183 10*3/uL (ref 140–400)
RBC: 4.56 10*6/uL (ref 3.80–5.10)
RDW: 13.1 % (ref 11.0–15.0)
Total Lymphocyte: 33.2 %
WBC: 5 10*3/uL (ref 3.8–10.8)

## 2019-04-16 LAB — URINALYSIS, ROUTINE W REFLEX MICROSCOPIC
Bilirubin Urine: NEGATIVE
Glucose, UA: NEGATIVE
Hgb urine dipstick: NEGATIVE
Ketones, ur: NEGATIVE
Leukocytes,Ua: NEGATIVE
Nitrite: NEGATIVE
Protein, ur: NEGATIVE
Specific Gravity, Urine: 1.01 (ref 1.001–1.03)
pH: <=5 (ref 5.0–8.0)

## 2019-04-16 LAB — MICROALBUMIN / CREATININE URINE RATIO
Creatinine, Urine: 56 mg/dL (ref 20–275)
Microalb, Ur: <0.2 mg/dL

## 2019-04-16 LAB — VITAMIN D 25 HYDROXY (VIT D DEFICIENCY, FRACTURES): Vit D, 25-Hydroxy: 71 ng/mL (ref 30–100)

## 2019-04-16 LAB — HEMOGLOBIN A1C
Hgb A1c MFr Bld: 7.1 % of total Hgb — ABNORMAL HIGH (ref <5.7)
Mean Plasma Glucose: 157 (calc)
eAG (mmol/L): 8.7 (calc)

## 2019-04-16 LAB — TSH: TSH: 1.67 mIU/L (ref 0.40–4.50)

## 2019-04-16 LAB — LIPID PANEL
Cholesterol: 173 mg/dL (ref <200)
HDL: 44 mg/dL — ABNORMAL LOW (ref >=50)
LDL Cholesterol (Calc): 105 mg/dL (calc) — ABNORMAL HIGH
Non-HDL Cholesterol (Calc): 129 mg/dL (calc) (ref <130)
Total CHOL/HDL Ratio: 3.9 (calc) (ref <5.0)
Triglycerides: 138 mg/dL (ref <150)

## 2019-04-16 LAB — MAGNESIUM: Magnesium: 2.1 mg/dL (ref 1.5–2.5)

## 2019-05-03 ENCOUNTER — Other Ambulatory Visit: Payer: Self-pay | Admitting: Neurology

## 2019-05-10 ENCOUNTER — Other Ambulatory Visit: Payer: Self-pay | Admitting: Physician Assistant

## 2019-05-10 DIAGNOSIS — G2581 Restless legs syndrome: Secondary | ICD-10-CM

## 2019-06-14 ENCOUNTER — Ambulatory Visit: Payer: Self-pay | Admitting: Physician Assistant

## 2019-06-15 ENCOUNTER — Ambulatory Visit: Payer: Medicare Other | Admitting: Physician Assistant

## 2019-06-28 ENCOUNTER — Other Ambulatory Visit: Payer: Self-pay | Admitting: Physician Assistant

## 2019-06-28 DIAGNOSIS — Z1231 Encounter for screening mammogram for malignant neoplasm of breast: Secondary | ICD-10-CM

## 2019-07-01 ENCOUNTER — Other Ambulatory Visit: Payer: Self-pay

## 2019-07-01 ENCOUNTER — Ambulatory Visit
Admission: RE | Admit: 2019-07-01 | Discharge: 2019-07-01 | Disposition: A | Discharge status: Home / Self Care | Payer: Medicare Other | Source: Ambulatory Visit | Attending: Physician Assistant | Admitting: Physician Assistant

## 2019-07-01 DIAGNOSIS — Z1231 Encounter for screening mammogram for malignant neoplasm of breast: Secondary | ICD-10-CM | POA: Diagnosis not present

## 2019-07-19 NOTE — Progress Notes (Signed)
Medicare wellness visit  Assessment and Plan:  Medicare encounter  Essential hypertension, benign - continue medications, DASH diet, exercise and monitor at home. Call if greater than 130/80. - CBC with Differential/Platelet - CMP/GFR   Restless leg syndrome Continue requip   Hyperlipidemia -adamantly declines medications; risk of MI/CVA discussed - check lipids, decrease fatty foods, increase activity.  - Lipid panel - TSH   Diverticulosis of large intestine without hemorrhage Remission, increase fiber  Vitamin D deficiency At goal at recent check; continue to recommend supplementation for goal of 60-100 Defer vitamin D level   Medication management - Magnesium  Gastroesophageal reflux disease with esophagitis Continue PPI/H2 blocker, diet discussed  Diabetic autonomic neuropathy associated with type 2 diabetes mellitus Do daily feet checks  Controlled type 2 diabetes mellitus with stage 2 chronic kidney disease, without long-term current use of insulin (HCC) -     COMPLETE METABOLIC PANEL WITH GFR -     Hemoglobin A1c  CKD stage 2 due to type 2 diabetes mellitus (HCC) -     COMPLETE METABOLIC PANEL WITH GFR -     Hemoglobin A1c  Overweight (BMI 25.0-29.9)  Estrogen def Last dexa in 2008, DEXA ordered to be scheduled with next mammogram   Discussed med's effects and SE's. Screening labs and tests as requested with regular follow-up as recommended.  Future Appointments  Date Time Provider [place]  09/24/2019  9:30 AM Narda Amber K, DO [facility] None  12/13/2019  9:15 AM Bernarda Caffey, MD [facility] None  04/20/2020 10:00 AM Vicie Mutters, PA-C [facility] None     During the course of the visit the patient was educated and counseled about appropriate screening and preventive services including:    Pneumococcal vaccine   Influenza vaccine  Td vaccine  Prevnar 13  Screening electrocardiogram  Screening mammography  Bone densitometry  screening  Colorectal cancer screening  Diabetes screening  Glaucoma screening  Nutrition counseling   Advanced directives: given info/requested copies   HPI 68 y.o. female  presents for a 3 month follow up for diet controlled diabetes, hyperlipidemia, hypertension, vitamin D deficiency, CKD II and Annual Medicare Wellness exam.   She retired May 2nd 2018, states she is doing well.   She is on requip for RLS which helps, she follows with Dr. Posey Pronto, on 3 at night and occ 0.25mg  during the day   BMI is Body mass index is 28.72 kg/m., she has not been working on diet and exercise. Wt Readings from Last 3 Encounters:  07/20/19 172 lb 9.6 oz (78.3 kg)  04/15/19 170 lb 6.4 oz (77.3 kg)  01/13/19 166 lb (75.3 kg)   She does not check BP at home due to always been steady, today their BP is BP: 130/74 She does not workout. She denies chest pain, shortness of breath, dizziness.   She is not on cholesterol medication, she absolutely declines any medications due to her husband's very poor experience. Her cholesterol is not at goal of LDL <70. The cholesterol last visit was:   Lab Results  Component Value Date   CHOL 173 04/15/2019   HDL 44 (L) 04/15/2019   LDLCALC 105 (H) 04/15/2019   TRIG 138 04/15/2019   CHOLHDL 3.9 04/15/2019   She has been working on diet and exercise for diabetes with CKD stage II but could not tolerate the metformin, currently managing by lifestyle only, she is on bASA, she is on an ACE, and denies polydipsia, polyuria and visual disturbances. She has glucometer but  doesn't currently check fasting glucose. Last A1C in the office was:  Lab Results  Component Value Date   HGBA1C 7.1 (H) 04/15/2019   Lab Results  Component Value Date   GFRNONAA 60 04/15/2019   Patient is on Vitamin D supplement.   Lab Results  Component Value Date   VD25OH 71 04/15/2019      Current Medications:  Current Outpatient Medications on File Prior to Visit  Medication Sig   . aspirin EC 81 MG tablet Take 81 mg by mouth daily.  Marland Kitchen CRANBERRY EXTRACT PO Take 2 tablets by mouth daily.   . cyclobenzaprine (FLEXERIL) 10 MG tablet Take 1 tablet (10 mg total) by mouth 3 (three) times daily as needed for muscle spasms. Take 1/2-1 tab  . FIBER ADULT GUMMIES PO Take by mouth daily.  Marland Kitchen ibuprofen (ADVIL,MOTRIN) 600 MG tablet Take 600 mg by mouth every morning.  Marland Kitchen lisinopril (PRINIVIL,ZESTRIL) 10 MG tablet TAKE 1 TABLET BY MOUTH DAILY FOR BLOOD PRESSURE  . Multiple Vitamins-Minerals (MULTIVITAMIN PO) Take by mouth daily.  Marland Kitchen OVER THE COUNTER MEDICATION Uses OTC eye drops for dry eyes.  . Phenazopyridine HCl (AZO TABS PO) Take 2 tablets by mouth daily.  Marland Kitchen rOPINIRole (REQUIP) 0.5 MG tablet TAKE 1 TABLET BY MOUTH EVERY DAY AT 11AM  . rOPINIRole (REQUIP) 3 MG tablet Take 1 tablet at Bedtime for Restless Legs  . VITAMIN D, CHOLECALCIFEROL, PO Take 4,000 Units by mouth daily.    No current facility-administered medications on file prior to visit.    Health Maintenance:   Immunization History  Administered Date(s) Administered  . Influenza, High Dose Seasonal PF 08/01/2017, 09/22/2018  . Pneumococcal Conjugate-13 04/05/2016  . Pneumococcal Polysaccharide-23 04/15/2017  . Tdap 03/07/2011   Tetanus: 2012 Pneumovax: 2018 Prevnar 13: 2017 Flu vaccine: 2019 DUE - given today  Zostavax: declines  Pap: 2016 negative MGM: 06/2019 DEXA: 2008 normal, declines for now Colonoscopy: 06/2016, Dr. Deatra Ina EGD: N/A MRI lumbar spine 2013 CXR 2017  Eye exam: Dr. Rodman Key 11/2017 - DUE diabetes eye - encouraged to schedule Dentist: Dr. Harrington Challenger q 6 months 2020  Patient Care Team: Unk Pinto, MD as PCP - General (Internal Medicine) Unk Pinto, MD as PCP - Internal Medicine (Internal Medicine) Phylliss Bob, MD as Consulting Physician (Orthopedic Surgery)   Allergies:  Allergies  Allergen Reactions  . Hctz [Hydrochlorothiazide]     Leg cramping  . Metformin And Related  Nausea Only  . Phenylbutazones Swelling  . Tropicamide Swelling    Red /itching/watery eyes   Medical History:  Past Medical History:  Diagnosis Date  . Allergy    otc med prn  . Arthritis    hips, lower back  . Cataract    bilateral  . Chronic kidney disease    stage 3 per patient   . Diet-controlled type 2 diabetes mellitus (HCC)    Diet controlled, no meds  . Diverticulosis   . Dribbling urine   . EKG abnormalities    pt states she had a BBB on her last EKG, no problems  . Essential hypertension, benign 08/19/2013  . HPV in female   . Hyperlipidemia    diet controlled, no med  . Restless leg syndrome   . Snoring   . Vitamin D deficiency    Surgical History:  She  has a past surgical history that includes Vaginal hysterectomy (1992); Tubal ligation (1981); Ovarian cyst removal (1981); Lumbar laminectomy/decompression microdiscectomy (05/07/2012); Colonoscopy (2012); and Breast biopsy (Left, 1969).  Family History:  Her family history includes Atrial fibrillation in her brother, brother, sister, and sister; Cataracts in her mother; Colon cancer (age of onset: 73) in her mother; Colon polyps in her brother and sister; Diabetes in her mother, sister, and sister; Heart failure in her mother; Heart failure (age of onset: 78) in her father; Hypertension in her mother; Stroke in her mother; Stroke (age of onset: 34) in her sister.   Social History:  She  reports that she is a non-smoker but has been exposed to tobacco smoke. She has never used smokeless tobacco. She reports that she does not drink alcohol or use drugs.  MEDICARE WELLNESS OBJECTIVES: Physical activity: Current Exercise Habits: The patient does not participate in regular exercise at present, Exercise limited by: neurologic condition(s) Cardiac risk factors: Cardiac Risk Factors include: advanced age (>37men, >42 women);hypertension;dyslipidemia;diabetes mellitus;sedentary lifestyle Depression/mood screen:    Depression screen [facility] 2/9 07/20/2019  Decreased Interest 0  Down, Depressed, Hopeless 0  PHQ - 2 Score 0    ADLs:  In your present state of health, do you have any difficulty performing the following activities: 07/20/2019 01/12/2019  Hearing? N N  Vision? N N  Difficulty concentrating or making decisions? N N  Walking or climbing stairs? N N  Dressing or bathing? N N  Doing errands, shopping? N N  Some recent data might be hidden    Cognitive Testing  Alert? Yes  Normal Appearance?Yes  Oriented to person? Yes  Place? Yes   Time? Yes  Recall of three objects?  Yes  Can perform simple calculations? Yes  Displays appropriate judgment?Yes  Can read the correct time from a watch face?Yes  EOL planning: Does Patient Have a Medical Advance Directive?: Yes Type of Advance Directive: Healthcare Power of Attorney, Living will Does patient want to make changes to medical advance directive?: No - Patient declined Copy of [place] in Chart?: Yes - validated most recent copy scanned in chart (See row information)  Review of Systems  Constitutional: Negative.   HENT: Negative.   Eyes: Negative.  Negative for blurred vision.  Respiratory: Negative for cough, hemoptysis, sputum production, shortness of breath and wheezing.   Cardiovascular: Negative.  Negative for chest pain.  Gastrointestinal: Negative.   Genitourinary: Negative.        Stress incontinence  Musculoskeletal: Positive for myalgias (bilateral legs). Negative for back pain, falls, joint pain and neck pain.  Skin: Negative for itching and rash.  Neurological: Positive for tingling and sensory change (bilateral feet).  Psychiatric/Behavioral: Negative.    Physical Exam: Estimated body mass index is 28.72 kg/m as calculated from the following:   Height as of this encounter: 5\' 5"  (1.651 m).   Weight as of this encounter: 172 lb 9.6 oz (78.3 kg). BP 130/74   Pulse 75   Temp (!) 96.7 F (35.9 C)   Ht 5'  5" (1.651 m)   Wt 172 lb 9.6 oz (78.3 kg)   SpO2 96%   BMI 28.72 kg/m  General Appearance: Well nourished, in no apparent distress. Eyes: PERRLA, EOMs, conjunctiva no swelling or erythema, normal fundi and vessels. Sinuses: No Frontal/maxillary tenderness ENT/Mouth: Ext aud canals clear, normal light reflex with TMs without erythema, bulging.  Good dentition. No erythema, swelling, or exudate on post pharynx. Tonsils not swollen or erythematous. Hearing normal.  Neck: Supple, thyroid normal. No bruits Respiratory: Respiratory effort normal, BS equal bilaterally without rales, rhonchi, wheezing or stridor. Cardio: RRR without murmurs, rubs or gallops. Brisk peripheral  pulses without edema.  Chest: symmetric, with normal excursions and percussion. Breasts: Symmetric, without lumps, nipple discharge, retractions. Abdomen: Soft, +BS, Non tender, no guarding, rebound, hernias, masses, or organomegaly. .  Lymphatics: Non tender without lymphadenopathy.  Musculoskeletal: Full ROM all peripheral extremities,5/5 strength, and normal gait.  Skin: + varicose veins bilateral legs. Warm, dry without rashes, lesions, ecchymosis.  Neuro: Cranial nerves intact, reflexes equal bilaterally. Normal muscle tone, no cerebellar symptoms. Sensation intact to monofilament 10/10 bilateral feet Psych: Awake and oriented X 3, normal affect, Insight and Judgment appropriate.    Medicare Attestation I have personally reviewed: The patient's medical and social history Their use of alcohol, tobacco or illicit drugs Their current medications and supplements The patient's functional ability including ADLs,fall risks, home safety risks, cognitive, and hearing and visual impairment Diet and physical activities Evidence for depression or mood disorders  The patient's weight, height, BMI, and visual acuity have been recorded in the chart.  I have made referrals, counseling, and provided education to the patient based on  review of the above and I have provided the patient with a written personalized care plan for preventive services.     Izora Ribas 10:32 AM

## 2019-07-20 ENCOUNTER — Other Ambulatory Visit: Payer: Self-pay

## 2019-07-20 ENCOUNTER — Encounter: Payer: Self-pay | Admitting: Adult Health

## 2019-07-20 ENCOUNTER — Ambulatory Visit: Payer: Medicare Other | Admitting: Physician Assistant

## 2019-07-20 ENCOUNTER — Ambulatory Visit (INDEPENDENT_AMBULATORY_CARE_PROVIDER_SITE_OTHER): Payer: Medicare Other | Admitting: Adult Health

## 2019-07-20 VITALS — BP 130/74 | HR 75 | Temp 96.7°F | Ht 65.0 in | Wt 172.6 lb

## 2019-07-20 DIAGNOSIS — E1122 Type 2 diabetes mellitus with diabetic chronic kidney disease: Secondary | ICD-10-CM

## 2019-07-20 DIAGNOSIS — E1169 Type 2 diabetes mellitus with other specified complication: Secondary | ICD-10-CM | POA: Diagnosis not present

## 2019-07-20 DIAGNOSIS — E119 Type 2 diabetes mellitus without complications: Secondary | ICD-10-CM

## 2019-07-20 DIAGNOSIS — G5713 Meralgia paresthetica, bilateral lower limbs: Secondary | ICD-10-CM

## 2019-07-20 DIAGNOSIS — Z23 Encounter for immunization: Secondary | ICD-10-CM

## 2019-07-20 DIAGNOSIS — E559 Vitamin D deficiency, unspecified: Secondary | ICD-10-CM

## 2019-07-20 DIAGNOSIS — E1142 Type 2 diabetes mellitus with diabetic polyneuropathy: Secondary | ICD-10-CM | POA: Diagnosis not present

## 2019-07-20 DIAGNOSIS — N182 Chronic kidney disease, stage 2 (mild): Secondary | ICD-10-CM

## 2019-07-20 DIAGNOSIS — E2839 Other primary ovarian failure: Secondary | ICD-10-CM

## 2019-07-20 DIAGNOSIS — K2100 Gastro-esophageal reflux disease with esophagitis, without bleeding: Secondary | ICD-10-CM | POA: Diagnosis not present

## 2019-07-20 DIAGNOSIS — E663 Overweight: Secondary | ICD-10-CM | POA: Diagnosis not present

## 2019-07-20 DIAGNOSIS — R6889 Other general symptoms and signs: Secondary | ICD-10-CM

## 2019-07-20 DIAGNOSIS — K5730 Diverticulosis of large intestine without perforation or abscess without bleeding: Secondary | ICD-10-CM

## 2019-07-20 DIAGNOSIS — Z0000 Encounter for general adult medical examination without abnormal findings: Secondary | ICD-10-CM

## 2019-07-20 DIAGNOSIS — G2581 Restless legs syndrome: Secondary | ICD-10-CM

## 2019-07-20 DIAGNOSIS — I10 Essential (primary) hypertension: Secondary | ICD-10-CM

## 2019-07-20 DIAGNOSIS — Z79899 Other long term (current) drug therapy: Secondary | ICD-10-CM | POA: Diagnosis not present

## 2019-07-20 DIAGNOSIS — E785 Hyperlipidemia, unspecified: Secondary | ICD-10-CM

## 2019-07-20 DIAGNOSIS — Z0001 Encounter for general adult medical examination with abnormal findings: Secondary | ICD-10-CM | POA: Diagnosis not present

## 2019-07-20 NOTE — Patient Instructions (Addendum)
  Ms. Crittenden , Thank you for taking time to come for your Medicare Wellness Visit. I appreciate your ongoing commitment to your health goals. Please review the following plan we discussed and let me know if I can assist you in the future.   These are the goals we discussed: Goals    . Exercise 150 min/wk Moderate Activity    . Weight (lb) < 165 lb (74.8 kg)       This is a list of the screening recommended for you and due dates:  Health Maintenance  Topic Date Due  . Flu Shot  05/15/2019  . Complete foot exam   06/10/2019  . Eye exam for diabetics  12/08/2019*  . Hemoglobin A1C  10/16/2019  . Mammogram  06/30/2021  . Tetanus Vaccine  07/03/2021  . Colon Cancer Screening  07/12/2026  . DEXA scan (bone density measurement)  Completed  .  Hepatitis C: One time screening is recommended by Center for Disease Control  (CDC) for  adults born from [birth year] through 1966.   Completed  . Pneumonia vaccines  Completed  *Topic was postponed. The date shown is not the original due date.     We recommend annual eye exam for diabetes eye screening   Please request to schedule the ordered bone density exam with your next mammogram in 06/2020   General health recommendations  Know what a healthy weight is for you (roughly BMI <25) and aim to maintain this  Aim for 7+ servings of fruits and vegetables daily  65-80+ fluid ounces of water or unsweet tea for healthy kidneys  Limit to max 1 drink of alcohol per day; avoid smoking/tobacco  Limit animal fats in diet for cholesterol and heart health - choose grass fed whenever available  Avoid highly processed foods, and foods high in saturated/trans fats  Aim for low stress - take time to unwind and care for your mental health  Aim for 150 min of moderate intensity exercise weekly for heart health, and weights twice weekly for bone health  Aim for 7-9 hours of sleep daily

## 2019-07-21 LAB — COMPLETE METABOLIC PANEL WITH GFR
AG Ratio: 1.3 (calc) (ref 1.0–2.5)
ALT: 19 U/L (ref 6–29)
AST: 18 U/L (ref 10–35)
Albumin: 4.1 g/dL (ref 3.6–5.1)
Alkaline phosphatase (APISO): 71 U/L (ref 37–153)
BUN/Creatinine Ratio: 14 (calc) (ref 6–22)
BUN: 14 mg/dL (ref 7–25)
CO2: 27 mmol/L (ref 20–32)
Calcium: 9.7 mg/dL (ref 8.6–10.4)
Chloride: 104 mmol/L (ref 98–110)
Creat: 1.02 mg/dL — ABNORMAL HIGH (ref 0.50–0.99)
GFR, Est African American: 65 mL/min/{1.73_m2} (ref >=60)
GFR, Est Non African American: 56 mL/min/{1.73_m2} — ABNORMAL LOW (ref >=60)
Globulin: 3.1 g/dL (calc) (ref 1.9–3.7)
Glucose, Bld: 123 mg/dL — ABNORMAL HIGH (ref 65–99)
Potassium: 4.4 mmol/L (ref 3.5–5.3)
Sodium: 139 mmol/L (ref 135–146)
Total Bilirubin: 0.5 mg/dL (ref 0.2–1.2)
Total Protein: 7.2 g/dL (ref 6.1–8.1)

## 2019-07-21 LAB — LIPID PANEL
Cholesterol: 180 mg/dL (ref <200)
HDL: 40 mg/dL — ABNORMAL LOW (ref >=50)
LDL Cholesterol (Calc): 104 mg/dL (calc) — ABNORMAL HIGH
Non-HDL Cholesterol (Calc): 140 mg/dL (calc) — ABNORMAL HIGH (ref <130)
Total CHOL/HDL Ratio: 4.5 (calc) (ref <5.0)
Triglycerides: 240 mg/dL — ABNORMAL HIGH (ref <150)

## 2019-07-21 LAB — CBC WITH DIFFERENTIAL/PLATELET
Absolute Monocytes: 354 cells/uL (ref 200–950)
Basophils Absolute: 31 cells/uL (ref 0–200)
Basophils Relative: 0.6 %
Eosinophils Absolute: 161 cells/uL (ref 15–500)
Eosinophils Relative: 3.1 %
HCT: 41.9 % (ref 35.0–45.0)
Hemoglobin: 14 g/dL (ref 11.7–15.5)
Lymphs Abs: 1914 cells/uL (ref 850–3900)
MCH: 29.4 pg (ref 27.0–33.0)
MCHC: 33.4 g/dL (ref 32.0–36.0)
MCV: 87.8 fL (ref 80.0–100.0)
MPV: 9.6 fL (ref 7.5–12.5)
Monocytes Relative: 6.8 %
Neutro Abs: 2740 cells/uL (ref 1500–7800)
Neutrophils Relative %: 52.7 %
Platelets: 172 10*3/uL (ref 140–400)
RBC: 4.77 10*6/uL (ref 3.80–5.10)
RDW: 12.6 % (ref 11.0–15.0)
Total Lymphocyte: 36.8 %
WBC: 5.2 10*3/uL (ref 3.8–10.8)

## 2019-07-21 LAB — HEMOGLOBIN A1C
Hgb A1c MFr Bld: 7.3 % of total Hgb — ABNORMAL HIGH (ref <5.7)
Mean Plasma Glucose: 163 (calc)
eAG (mmol/L): 9 (calc)

## 2019-07-21 LAB — MAGNESIUM: Magnesium: 2 mg/dL (ref 1.5–2.5)

## 2019-07-21 LAB — TSH: TSH: 1.22 mIU/L (ref 0.40–4.50)

## 2019-08-01 ENCOUNTER — Other Ambulatory Visit: Payer: Self-pay | Admitting: Physician Assistant

## 2019-09-24 ENCOUNTER — Other Ambulatory Visit: Payer: Self-pay

## 2019-09-24 ENCOUNTER — Telehealth (INDEPENDENT_AMBULATORY_CARE_PROVIDER_SITE_OTHER): Payer: Medicare Other | Admitting: Neurology

## 2019-09-24 ENCOUNTER — Encounter: Payer: Self-pay | Admitting: Neurology

## 2019-09-24 VITALS — Ht 65.0 in | Wt 174.0 lb

## 2019-09-24 DIAGNOSIS — G2581 Restless legs syndrome: Secondary | ICD-10-CM | POA: Diagnosis not present

## 2019-09-24 MED ORDER — ROPINIROLE HCL 0.5 MG PO TABS: ORAL_TABLET | ORAL | 3 refills | Status: DC

## 2019-09-24 NOTE — Progress Notes (Signed)
    Virtual Visit via Telephone Note The purpose of this virtual visit is to provide medical care while limiting exposure to the novel coronavirus.    Consent was obtained for phone visit:  Yes.   Answered questions that patient had about telehealth interaction:  Yes.   I discussed the limitations, risks, security and privacy concerns of performing an evaluation and management service by telephone. I also discussed with the patient that there may be a patient responsible charge related to this service. The patient expressed understanding and agreed to proceed.  Pt location: Home Physician Location: office Name of referring provider:  Unk Pinto, MD I connected with .Rachel Daniel at patients initiation/request on 09/24/2019 at  9:30 AM EST by telephone and verified that I am speaking with the correct person using two identifiers.  Pt MRN:  [id number] Pt DOB:  1951-09-06   History of Present Illness: This is a 68 year-old female for follow-up of restless leg syndrome.   She takes ropinirole 0.5mg  at 3pm and 3mg  at 9pm.  Over the past year, she has started noticed increased breakthrough symptoms occurring daily around 1-2pm and continuing throughout the evening.  Her nighttime symptoms are well-controlled on ropinirole 3mg  at bedtime.    Numbness is stable in the right foot.    Assessment and Plan:   Restless leg syndrome, worsening with breakthrough  - Adjust ropinirole 0.5mg  to noon and 0.5mg  4pm.  Future titrate, if tolerable (no sedation)  - Continue ropinirole 3mg  at bedtime  - Call with update, if no improvement  Follow Up Instructions:   I discussed the assessment and treatment plan with the patient. The patient was provided an opportunity to ask questions and all were answered. The patient agreed with the plan and demonstrated an understanding of the instructions.   The patient was advised to call back or seek an in-person evaluation if the symptoms worsen or if the  condition fails to improve as anticipated.  Return to clinic in 1 year  Total Time spent in visit with the patient was:  10 min, of which 100% of the time was spent in counseling and/or coordinating care.   Pt understands and agrees with the plan of care outlined.     Alda Berthold, DO

## 2019-10-27 ENCOUNTER — Encounter: Payer: Self-pay | Admitting: Internal Medicine

## 2019-10-27 NOTE — Patient Instructions (Signed)

## 2019-10-27 NOTE — Progress Notes (Signed)
History of Present Illness:      This very nice 69 y.o. WWF presents for 3 month follow up with HTN, HLD, Pre-Diabetes and Vitamin D Deficiency.       Patient is treated for HTN & BP has been controlled at home. Today's BP is at goal - 136/78. Patient has had no complaints of any cardiac type chest pain, palpitations, dyspnea / orthopnea / PND, dizziness, claudication, or dependent edema.      Hyperlipidemia is controlled with diet & meds. Patient denies myalgias or other med SE's. Last Lipids were not at goal:  Lab Results  Component Value Date   CHOL 180 07/20/2019   HDL 40 (L) 07/20/2019   LDLCALC 104 (H) 07/20/2019   TRIG 240 (H) 07/20/2019   CHOLHDL 4.5 07/20/2019        Also, the patient has history of T2_NIDDM circa 2001 and (A1c  6.5% / 2016 & 6.8% / 2016)   has had no symptoms of reactive hypoglycemia, diabetic polys, paresthesias or visual blurring.  Last A1c was not at goal:   Lab Results  Component Value Date   HGBA1C 7.3 (H) 07/20/2019        Further, the patient also has history of Vitamin D Deficiency and supplements vitamin D without any suspected side-effects. Last vitamin D was at goal:  Lab Results  Component Value Date   VD25OH 71 04/15/2019    Current Outpatient Medications on File Prior to Visit  Medication Sig  . aspirin EC 81 MG tablet Take 81 mg by mouth daily.  Marland Kitchen CRANBERRY EXTRACT PO Take 2 tablets by mouth daily.   . cyclobenzaprine (FLEXERIL) 10 MG tablet Take 1 tablet (10 mg total) by mouth 3 (three) times daily as needed for muscle spasms. Take 1/2-1 tab  . FIBER ADULT GUMMIES PO Take by mouth daily.  Marland Kitchen ibuprofen (ADVIL,MOTRIN) 600 MG tablet Take 600 mg by mouth every morning.  Marland Kitchen lisinopril (ZESTRIL) 10 MG tablet Take 1 tablet Daily for BP  . Multiple Vitamins-Minerals (MULTIVITAMIN PO) Take by mouth daily.  Marland Kitchen OVER THE COUNTER MEDICATION Uses OTC eye drops for dry eyes.  . Phenazopyridine HCl (AZO TABS PO) Take 2 tablets by mouth daily.    Marland Kitchen rOPINIRole (REQUIP) 0.5 MG tablet Take 1 tablet at noon and 4pm.  . rOPINIRole (REQUIP) 3 MG tablet Take 1 tablet at Bedtime for Restless Legs  . VITAMIN D, CHOLECALCIFEROL, PO Take 4,000 Units by mouth daily.    No current facility-administered medications on file prior to visit.    Allergies  Allergen Reactions  . Hctz [Hydrochlorothiazide]     Leg cramping  . Metformin And Related Nausea Only  . Phenylbutazones Swelling  . Tropicamide Swelling    Red /itching/watery eyes    PMHx:   Past Medical History:  Diagnosis Date  . Allergy    otc med prn  . Arthritis    hips, lower back  . Cataract    bilateral  . Chronic kidney disease    stage 3 per patient   . Diet-controlled type 2 diabetes mellitus (HCC)    Diet controlled, no meds  . Diverticulosis   . Dribbling urine   . EKG abnormalities    pt states she had a BBB on her last EKG, no problems  . Essential hypertension, benign 08/19/2013  . HPV in female   . Hyperlipidemia    diet controlled, no med  . Restless leg syndrome   .  Snoring   . Vitamin D deficiency    Immunization History  Administered Date(s) Administered  . Influenza, High Dose Seasonal PF 08/01/2017, 09/22/2018, 07/20/2019  . Pneumococcal Conjugate-13 04/05/2016  . Pneumococcal Polysaccharide-23 04/15/2017  . Tdap 03/07/2011   Past Surgical History:  Procedure Laterality Date  . BREAST BIOPSY Left 1969   left lumpectomy - benign  . COLONOSCOPY  2012   kaplan-polyps, tics  . LUMBAR LAMINECTOMY/DECOMPRESSION MICRODISCECTOMY  05/07/2012   Procedure: LUMBAR LAMINECTOMY/DECOMPRESSION MICRODISCECTOMY;  Surgeon: Sinclair Ship, MD;  Location: [facility];  Service: Orthopedics;  Laterality: Bilateral;  Lumbar 4-5 decompression  . OVARIAN CYST REMOVAL  1981   right  . TUBAL LIGATION  1981  . VAGINAL HYSTERECTOMY  1992    FHx:    Reviewed / unchanged  SHx:    Reviewed / unchanged   Systems Review:  Constitutional: Denies fever, chills, wt  changes, headaches, insomnia, fatigue, night sweats, change in appetite. Eyes: Denies redness, blurred vision, diplopia, discharge, itchy, watery eyes.  ENT: Denies discharge, congestion, post nasal drip, epistaxis, sore throat, earache, hearing loss, dental pain, tinnitus, vertigo, sinus pain, snoring.  CV: Denies chest pain, palpitations, irregular heartbeat, syncope, dyspnea, diaphoresis, orthopnea, PND, claudication or edema. Respiratory: denies cough, dyspnea, DOE, pleurisy, hoarseness, laryngitis, wheezing.  Gastrointestinal: Denies dysphagia, odynophagia, heartburn, reflux, water brash, abdominal pain or cramps, nausea, vomiting, bloating, diarrhea, constipation, hematemesis, melena, hematochezia  or hemorrhoids. Genitourinary: Denies dysuria, frequency, urgency, nocturia, hesitancy, discharge, hematuria or flank pain. Musculoskeletal: Denies arthralgias, myalgias, stiffness, jt. swelling, pain, limping or strain/sprain.  Skin: Denies pruritus, rash, hives, warts, acne, eczema or change in skin lesion(s). Neuro: No weakness, tremor, incoordination, spasms, paresthesia or pain. Psychiatric: Denies confusion, memory loss or sensory loss. Endo: Denies change in weight, skin or hair change.  Heme/Lymph: No excessive bleeding, bruising or enlarged lymph nodes.  Physical Exam  BP 136/78   Pulse 76   Temp (!) 97 F (36.1 C)   Resp 16   Ht 5\' 5"  (1.651 m)   Wt 174 lb 12.8 oz (79.3 kg)   BMI 29.09 kg/m   Appears  well nourished, well groomed  and in no distress.  Eyes: PERRLA, EOMs, conjunctiva no swelling or erythema. Sinuses: No frontal/maxillary tenderness ENT/Mouth: EAC's clear, TM's nl w/o erythema, bulging. Nares clear w/o erythema, swelling, exudates. Oropharynx clear without erythema or exudates. Oral hygiene is good. Tongue normal, non obstructing. Hearing intact.  Neck: Supple. Thyroid not palpable. Car 2+/2+ without bruits, nodes or JVD. Chest: Respirations nl with BS clear &  equal w/o rales, rhonchi, wheezing or stridor.  Cor: Heart sounds normal w/ regular rate and rhythm without sig. murmurs, gallops, clicks or rubs. Peripheral pulses normal and equal  without edema.  Abdomen: Soft & bowel sounds normal. Non-tender w/o guarding, rebound, hernias, masses or organomegaly.  Lymphatics: Unremarkable.  Musculoskeletal: Full ROM all peripheral extremities, joint stability, 5/5 strength and normal gait.  Skin: Warm, dry without exposed rashes, lesions or ecchymosis apparent.  Neuro: Cranial nerves intact, reflexes equal bilaterally. Sensory-motor testing grossly intact. Tendon reflexes grossly intact.  Pysch: Alert & oriented x 3.  Insight and judgement nl & appropriate. No ideations.  Assessment and Plan:  1. Essential hypertension, benign  - Continue medication, monitor blood pressure at home.  - Continue DASH diet.  Reminder to go to the ER if any CP,  SOB, nausea, dizziness, severe HA, changes vision/speech.  - CBC with Diff - COMPLETE METABOLIC PANEL WITH GFR - Magnesium - TSH  2. Hyperlipidemia associated with type 2 diabetes mellitus ([place])  - Continue diet/meds, exercise,& lifestyle modifications.  - Continue monitor periodic cholesterol/liver & renal functions   - Lipid Profile - TSH  3. Controlled type 2 diabetes mellitus with stage 2 chronic kidney  disease, without long-term current use of insulin (HCC)  - Continue diet, exercise  - Lifestyle modifications.  - Monitor appropriate labs.  - Hemoglobin A1c ([facility]) - Insulin, random  4. Vitamin D deficiency  - Continue supplementation.  - Vitamin D (25 hydroxy)  5. Gastroesophageal reflux disease  - CBC with Diff  6. Medication management  - CBC with Diff - COMPLETE METABOLIC PANEL WITH GFR - Magnesium - Lipid Profile - TSH - Hemoglobin A1c ([facility]) - Insulin, random - Vitamin D (25 hydroxy)       Discussed  regular exercise, BP monitoring, weight control to  achieve/maintain BMI less than 25 and discussed med and SE's. Recommended labs to assess and monitor clinical status with further disposition pending results of labs.  I discussed the assessment and treatment plan with the patient. The patient was provided an opportunity to ask questions and all were answered. The patient agreed with the plan and demonstrated an understanding of the instructions.  I provided over 30 minutes of exam, counseling, chart review and  complex critical decision making.  Kirtland Bouchard, MD

## 2019-10-28 ENCOUNTER — Other Ambulatory Visit: Payer: Self-pay

## 2019-10-28 ENCOUNTER — Ambulatory Visit (INDEPENDENT_AMBULATORY_CARE_PROVIDER_SITE_OTHER): Payer: Medicare Other | Admitting: Internal Medicine

## 2019-10-28 VITALS — BP 136/78 | HR 76 | Temp 97.0°F | Resp 16 | Ht 65.0 in | Wt 174.8 lb

## 2019-10-28 DIAGNOSIS — E785 Hyperlipidemia, unspecified: Secondary | ICD-10-CM | POA: Diagnosis not present

## 2019-10-28 DIAGNOSIS — Z79899 Other long term (current) drug therapy: Secondary | ICD-10-CM | POA: Diagnosis not present

## 2019-10-28 DIAGNOSIS — E1169 Type 2 diabetes mellitus with other specified complication: Secondary | ICD-10-CM

## 2019-10-28 DIAGNOSIS — E559 Vitamin D deficiency, unspecified: Secondary | ICD-10-CM

## 2019-10-28 DIAGNOSIS — N182 Chronic kidney disease, stage 2 (mild): Secondary | ICD-10-CM | POA: Diagnosis not present

## 2019-10-28 DIAGNOSIS — E1122 Type 2 diabetes mellitus with diabetic chronic kidney disease: Secondary | ICD-10-CM

## 2019-10-28 DIAGNOSIS — K2100 Gastro-esophageal reflux disease with esophagitis, without bleeding: Secondary | ICD-10-CM | POA: Diagnosis not present

## 2019-10-28 DIAGNOSIS — I10 Essential (primary) hypertension: Secondary | ICD-10-CM | POA: Diagnosis not present

## 2019-10-29 ENCOUNTER — Other Ambulatory Visit: Payer: Self-pay | Admitting: Internal Medicine

## 2019-10-29 LAB — LIPID PANEL
Cholesterol: 175 mg/dL (ref <200)
HDL: 42 mg/dL — ABNORMAL LOW (ref >=50)
LDL Cholesterol (Calc): 107 mg/dL (calc) — ABNORMAL HIGH
Non-HDL Cholesterol (Calc): 133 mg/dL (calc) — ABNORMAL HIGH (ref <130)
Total CHOL/HDL Ratio: 4.2 (calc) (ref <5.0)
Triglycerides: 148 mg/dL (ref <150)

## 2019-10-29 LAB — CBC WITH DIFFERENTIAL/PLATELET
Absolute Monocytes: 378 cells/uL (ref 200–950)
Basophils Absolute: 31 cells/uL (ref 0–200)
Basophils Relative: 0.5 %
Eosinophils Absolute: 93 cells/uL (ref 15–500)
Eosinophils Relative: 1.5 %
HCT: 39.4 % (ref 35.0–45.0)
Hemoglobin: 13.5 g/dL (ref 11.7–15.5)
Lymphs Abs: 1680 cells/uL (ref 850–3900)
MCH: 30.2 pg (ref 27.0–33.0)
MCHC: 34.3 g/dL (ref 32.0–36.0)
MCV: 88.1 fL (ref 80.0–100.0)
MPV: 9.7 fL (ref 7.5–12.5)
Monocytes Relative: 6.1 %
Neutro Abs: 4018 cells/uL (ref 1500–7800)
Neutrophils Relative %: 64.8 %
Platelets: 178 10*3/uL (ref 140–400)
RBC: 4.47 10*6/uL (ref 3.80–5.10)
RDW: 12.5 % (ref 11.0–15.0)
Total Lymphocyte: 27.1 %
WBC: 6.2 10*3/uL (ref 3.8–10.8)

## 2019-10-29 LAB — COMPLETE METABOLIC PANEL WITH GFR
AG Ratio: 1.4 (calc) (ref 1.0–2.5)
ALT: 19 U/L (ref 6–29)
AST: 17 U/L (ref 10–35)
Albumin: 4.2 g/dL (ref 3.6–5.1)
Alkaline phosphatase (APISO): 69 U/L (ref 37–153)
BUN/Creatinine Ratio: 13 (calc) (ref 6–22)
BUN: 14 mg/dL (ref 7–25)
CO2: 29 mmol/L (ref 20–32)
Calcium: 9.7 mg/dL (ref 8.6–10.4)
Chloride: 102 mmol/L (ref 98–110)
Creat: 1.11 mg/dL — ABNORMAL HIGH (ref 0.50–0.99)
GFR, Est African American: 59 mL/min/{1.73_m2} — ABNORMAL LOW (ref >=60)
GFR, Est Non African American: 51 mL/min/{1.73_m2} — ABNORMAL LOW (ref >=60)
Globulin: 3 g/dL (calc) (ref 1.9–3.7)
Glucose, Bld: 146 mg/dL — ABNORMAL HIGH (ref 65–99)
Potassium: 4.3 mmol/L (ref 3.5–5.3)
Sodium: 137 mmol/L (ref 135–146)
Total Bilirubin: 0.5 mg/dL (ref 0.2–1.2)
Total Protein: 7.2 g/dL (ref 6.1–8.1)

## 2019-10-29 LAB — INSULIN, RANDOM: Insulin: 21.6 u[IU]/mL — ABNORMAL HIGH

## 2019-10-29 LAB — TSH: TSH: 1.08 mIU/L (ref 0.40–4.50)

## 2019-10-29 LAB — HEMOGLOBIN A1C
Hgb A1c MFr Bld: 8.1 % of total Hgb — ABNORMAL HIGH (ref <5.7)
Mean Plasma Glucose: 186 (calc)
eAG (mmol/L): 10.3 (calc)

## 2019-10-29 LAB — VITAMIN D 25 HYDROXY (VIT D DEFICIENCY, FRACTURES): Vit D, 25-Hydroxy: 65 ng/mL (ref 30–100)

## 2019-10-29 LAB — MAGNESIUM: Magnesium: 2.1 mg/dL (ref 1.5–2.5)

## 2019-10-29 MED ORDER — METFORMIN HCL ER 500 MG PO TB24: ORAL_TABLET | ORAL | 1 refills | Status: DC

## 2019-11-01 ENCOUNTER — Other Ambulatory Visit: Payer: Self-pay | Admitting: Neurology

## 2019-11-29 NOTE — Progress Notes (Signed)
[facility] Note  12/13/2019     CHIEF COMPLAINT Patient presents for Retina Follow Up   HISTORY OF PRESENT ILLNESS: Rachel Daniel is a 69 y.o. female who presents to the clinic today for:   HPI    Retina Follow Up    Patient presents with  Other.  In both eyes.  This started 2 years ago.  Severity is mild.  Since onset it is stable.  I, the attending physician,  performed the HPI with the patient and updated documentation appropriately.          Comments    2 yr F/u DM OU.Patient states her vision has been good ,she has occasional floater os.Bs 140(39mos ago), patient does not monitor at home. A1C 8.2 (09/2019). Denies hypo/hyperglyemic episodes. Patient has allergic reactions to Troplicamide gtt's . I dilated her eyes with Paremyd OU today.       Last edited by Bernarda Caffey, MD on 12/13/2019  1:36 PM. (History)    Pt reports stable vision.  Referring physician: Liane Comber, NP [address] [place],  [place] [address]  HISTORICAL INFORMATION:   Selected notes from the MEDICAL RECORD NUMBER Referred by A. Corbett, NP (internal med) for DM exam;   LEE- unknown - saw Dr. Zigmund Daniel on 08.07.17, BCVA at that time OD: 20/20 OS: 20/20 Ocular Hx- cataract OU PMH- DM, arthritis, CKD, HTN    CURRENT MEDICATIONS: No current outpatient medications on file. (Ophthalmic Drugs)   No current facility-administered medications for this visit. (Ophthalmic Drugs)   Current Outpatient Medications (Other)  Medication Sig  . aspirin EC 81 MG tablet Take 81 mg by mouth daily.  Marland Kitchen CRANBERRY EXTRACT PO Take 2 tablets by mouth daily.   . cyclobenzaprine (FLEXERIL) 10 MG tablet Take 1 tablet (10 mg total) by mouth 3 (three) times daily as needed for muscle spasms. Take 1/2-1 tab  . FIBER ADULT GUMMIES PO Take by mouth daily.  Marland Kitchen ibuprofen (ADVIL,MOTRIN) 600 MG tablet Take 600 mg by mouth every morning.  Marland Kitchen lisinopril (ZESTRIL) 10 MG tablet Take 1  tablet Daily for BP  . Multiple Vitamins-Minerals (MULTIVITAMIN PO) Take by mouth daily.  Marland Kitchen OVER THE COUNTER MEDICATION Uses OTC eye drops for dry eyes.  . Phenazopyridine HCl (AZO TABS PO) Take 2 tablets by mouth daily.  Marland Kitchen rOPINIRole (REQUIP) 0.5 MG tablet TAKE 1 TABLET BY MOUTH EVERY DAY AT 11AM  . rOPINIRole (REQUIP) 3 MG tablet Take 1 tablet at Bedtime for Restless Legs  . VITAMIN D, CHOLECALCIFEROL, PO Take 4,000 Units by mouth daily.   . metFORMIN (GLUCOPHAGE XR) 500 MG 24 hr tablet Take 2 tablets 2 x /day with meals for Diabetes (Patient not taking: Reported on 12/13/2019)   No current facility-administered medications for this visit. (Other)      REVIEW OF SYSTEMS: ROS    Positive for: Endocrine, Eyes   Negative for: Constitutional, Gastrointestinal, Neurological, Skin, Genitourinary, Musculoskeletal, HENT, Cardiovascular, Respiratory, Psychiatric, Allergic/Imm, Heme/Lymph   Last edited by Zenovia Jordan, LPN on [id number]  [id number] AM. (History)       ALLERGIES Allergies  Allergen Reactions  . Hctz [Hydrochlorothiazide]     Leg cramping  . Metformin And Related Nausea Only  . Phenylbutazones Swelling  . Tropicamide Swelling    Red /itching/watery eyes    PAST MEDICAL HISTORY Past Medical History:  Diagnosis Date  . Allergy    otc med prn  . Arthritis    hips,  lower back  . Cataract    bilateral  . Chronic kidney disease    stage 3 per patient   . Diet-controlled type 2 diabetes mellitus (HCC)    Diet controlled, no meds  . Diverticulosis   . Dribbling urine   . EKG abnormalities    pt states she had a BBB on her last EKG, no problems  . Essential hypertension, benign 08/19/2013  . HPV in female   . Hyperlipidemia    diet controlled, no med  . Restless leg syndrome   . Snoring   . Vitamin D deficiency    Past Surgical History:  Procedure Laterality Date  . BREAST BIOPSY Left 1969   left lumpectomy - benign  . COLONOSCOPY  2012   kaplan-polyps, tics  .  LUMBAR LAMINECTOMY/DECOMPRESSION MICRODISCECTOMY  05/07/2012   Procedure: LUMBAR LAMINECTOMY/DECOMPRESSION MICRODISCECTOMY;  Surgeon: Sinclair Ship, MD;  Location: [facility];  Service: Orthopedics;  Laterality: Bilateral;  Lumbar 4-5 decompression  . OVARIAN CYST REMOVAL  1981   right  . TUBAL LIGATION  1981  . VAGINAL HYSTERECTOMY  1992    FAMILY HISTORY Family History  Problem Relation Age of Onset  . Hypertension Mother   . Diabetes Mother   . Stroke Mother   . Heart failure Mother   . Colon cancer Mother 85  . Cataracts Mother   . Heart failure Father 48  . Stroke Sister 75  . Colon polyps Sister   . Atrial fibrillation Sister   . Diabetes Sister   . Colon polyps Brother   . Atrial fibrillation Brother   . Atrial fibrillation Brother   . Atrial fibrillation Sister   . Diabetes Sister   . Stomach cancer Neg Hx   . Esophageal cancer Neg Hx   . Rectal cancer Neg Hx   . Amblyopia Neg Hx   . Blindness Neg Hx   . Glaucoma Neg Hx   . Macular degeneration Neg Hx   . Retinal detachment Neg Hx   . Strabismus Neg Hx   . Retinitis pigmentosa Neg Hx   . Breast cancer Neg Hx     SOCIAL HISTORY Social History   Tobacco Use  . Smoking status: Passive Smoke Exposure - Never Smoker  . Smokeless tobacco: Never Used  Substance Use Topics  . Alcohol use: No    Alcohol/week: 0.0 standard drinks  . Drug use: No         OPHTHALMIC EXAM:  Base Eye Exam    Visual Acuity (Snellen - Linear)      Right Left   Dist cc 20/25 20/25   Dist ph cc NI NI   Correction: Glasses       Tonometry (Tonopen, 10:06 AM)      Right Left   Pressure 10 12       Pupils      Dark Light Shape React APD   Right 3 2 Round Brisk None   Left 3 2 Round Brisk None       Visual Fields (Counting fingers)      Left Right    Full Full       Extraocular Movement      Right Left    Full, Ortho Full, Ortho       Neuro/Psych    Oriented x3: Yes   Mood/Affect: Normal       Dilation     Both eyes: Paremyd @ 9:36 AM        Slit Lamp  and Fundus Exam    Slit Lamp Exam      Right Left   Lids/Lashes Dermatochalasis - upper lid, mild Telangiectasia UL Dermatochalasis - upper lid, mild Telangiectasia UL   Conjunctiva/Sclera White and quiet White and quiet   Cornea Arcus, patch of 2+ Punctate epithelial erosions at 0900 Arcus, other wise clear   Anterior Chamber Deep, Temporal Narrow angle Deep, Temporal Narrow angle   Iris Round and dilated, No NVI Round and dilated, No NVI   Lens 2-3+ Nuclear sclerosis, 2-3+ Cortical cataract 2-3+ Nuclear sclerosis, 3 Cortical cataract   Vitreous Vitreous syneresis Vitreous syneresis, Posterior vitreous detachment       Fundus Exam      Right Left   Disc Pink and sharp.  Mild Peripapillary pigmentation and atrophy, No NVD 360 Peripapillary atrophy with pigmnetation   C/D Ratio 0.3 0.2   Macula Retinal pigment epithelial mottling, No heme or edema Flat, No heme or edema   Vessels Vascular attenuation Vascular attenuation   Periphery Attached.  No heme. Attached.  No heme.          IMAGING AND PROCEDURES  Imaging and Procedures for 11/27/17  OCT, Retina - OU - Both Eyes       Right Eye Quality was good. Central Foveal Thickness: 224. Progression has been stable. Findings include normal foveal contour, no IRF, no SRF.   Left Eye Quality was good. Central Foveal Thickness: 230. Progression has been stable. Findings include normal foveal contour, no IRF, no SRF.   Notes *Images captured and stored on drive  Diagnosis / Impression: NFP, No IRF/SRF No DME OU  Clinical management:  See below  Abbreviations: NFP - Normal foveal profile. CME - cystoid macular edema. PED - pigment epithelial detachment. IRF - intraretinal fluid. SRF - subretinal fluid. EZ - ellipsoid zone. ERM - epiretinal membrane. ORA - outer retinal atrophy. ORT - outer retinal tubulation. SRHM - subretinal hyper-reflective material                   ASSESSMENT/PLAN:    ICD-10-CM   1. Diabetes mellitus type 2 without retinopathy ([place])  E11.9   2. Posterior vitreous detachment of left eye  H43.812   3. Retinal edema  H35.81 OCT, Retina - OU - Both Eyes  4. Combined forms of age-related cataract of both eyes  H25.813     1. Diabetes mellitus, type 2 without retinopathy -- stable  - The incidence, risk factors for progression, natural history and treatment options for diabetic retinopathy  were discussed with patient.    - The need for close monitoring of blood glucose, blood pressure, and serum lipids, avoiding cigarette or any type of tobacco, and the need for long term follow up was also discussed with patient.  - f/u in 1-2 years, sooner prn  2. PVD OS  Long-standing, floater OS -- unchanged  Discussed findings and prognosis  No RT or RD on 360 peripheral exam  Reviewed s/s of RT/RD  Strict return precautions for any such RT/RD signs/symptoms  3. No retinal edema on exam or OCT  4. Combined form age-related cataract OU-   - The symptoms of cataract, surgical options, and treatments and risks were discussed with patient.  - discussed diagnosis and progression  - progressing but not yet visually significant  - monitor   Ophthalmic Meds Ordered this visit:  No orders of the defined types were placed in this encounter.      Return in about 1  year (around 12/12/2020) for DFE, OCT.  There are no Patient Instructions on file for this visit.   Explained the diagnoses, plan, and follow up with the patient and they expressed understanding.  Patient expressed understanding of the importance of proper follow up care.   This document serves as a record of services personally performed by Gardiner Sleeper, MD, PhD. It was created on their behalf by Leeann Must, [place], a certified ophthalmic assistant. The creation of this record is the provider's dictation and/or activities during the visit.    Electronically signed by: Leeann Must, COA @TODAY @ 1:41 PM   This document serves as a record of services personally performed by Gardiner Sleeper, MD, PhD. It was created on their behalf by Ernest Mallick, OA, an ophthalmic assistant. The creation of this record is the provider's dictation and/or activities during the visit.    Electronically signed by: Ernest Mallick, OA 03.01.2021 1:41 PM   Gardiner Sleeper, M.D., Ph.D. Diseases & Surgery of the Retina and Vitreous [facility]  I have reviewed the above documentation for accuracy and completeness, and I agree with the above. Gardiner Sleeper, M.D., Ph.D. 12/13/19 1:41 PM   Abbreviations: M myopia (nearsighted); A astigmatism; H hyperopia (farsighted); P presbyopia; Mrx spectacle prescription;  CTL contact lenses; OD right eye; OS left eye; OU both eyes  XT exotropia; ET esotropia; PEK punctate epithelial keratitis; PEE punctate epithelial erosions; DES dry eye syndrome; MGD meibomian gland dysfunction; ATs artificial tears; PFAT's preservative free artificial tears; [place] nuclear sclerotic cataract; PSC posterior subcapsular cataract; ERM epi-retinal membrane; PVD posterior vitreous detachment; RD retinal detachment; DM diabetes mellitus; DR diabetic retinopathy; NPDR non-proliferative diabetic retinopathy; PDR proliferative diabetic retinopathy; CSME clinically significant macular edema; DME diabetic macular edema; dbh dot blot hemorrhages; CWS cotton wool spot; POAG primary open angle glaucoma; C/D cup-to-disc ratio; HVF humphrey visual field; GVF goldmann visual field; OCT optical coherence tomography; IOP intraocular pressure; BRVO Branch retinal vein occlusion; CRVO central retinal vein occlusion; CRAO central retinal artery occlusion; BRAO branch retinal artery occlusion; RT retinal tear; SB scleral buckle; PPV pars plana vitrectomy; VH Vitreous hemorrhage; PRP panretinal laser photocoagulation; IVK intravitreal kenalog; VMT vitreomacular traction; MH  Macular hole;  NVD neovascularization of the disc; NVE neovascularization elsewhere; AREDS age related eye disease study; ARMD age related macular degeneration; POAG primary open angle glaucoma; EBMD epithelial/anterior basement membrane dystrophy; ACIOL anterior chamber intraocular lens; IOL intraocular lens; PCIOL posterior chamber intraocular lens; Phaco/IOL phacoemulsification with intraocular lens placement; [place] photorefractive keratectomy; LASIK laser assisted in situ keratomileusis; HTN hypertension; DM diabetes mellitus; COPD chronic obstructive pulmonary disease

## 2019-12-13 ENCOUNTER — Other Ambulatory Visit: Payer: Self-pay

## 2019-12-13 ENCOUNTER — Ambulatory Visit (INDEPENDENT_AMBULATORY_CARE_PROVIDER_SITE_OTHER): Payer: Medicare Other | Admitting: Ophthalmology

## 2019-12-13 ENCOUNTER — Encounter (INDEPENDENT_AMBULATORY_CARE_PROVIDER_SITE_OTHER): Payer: Self-pay | Admitting: Ophthalmology

## 2019-12-13 DIAGNOSIS — E119 Type 2 diabetes mellitus without complications: Secondary | ICD-10-CM | POA: Diagnosis not present

## 2019-12-13 DIAGNOSIS — H3581 Retinal edema: Secondary | ICD-10-CM | POA: Diagnosis not present

## 2019-12-13 DIAGNOSIS — H43812 Vitreous degeneration, left eye: Secondary | ICD-10-CM | POA: Diagnosis not present

## 2019-12-13 DIAGNOSIS — H25813 Combined forms of age-related cataract, bilateral: Secondary | ICD-10-CM | POA: Diagnosis not present

## 2019-12-13 LAB — HM DIABETES EYE EXAM

## 2019-12-14 ENCOUNTER — Encounter: Payer: Self-pay | Admitting: Internal Medicine

## 2019-12-20 ENCOUNTER — Encounter: Payer: Self-pay | Admitting: *Deleted

## 2020-01-18 DIAGNOSIS — Z23 Encounter for immunization: Secondary | ICD-10-CM | POA: Diagnosis not present

## 2020-01-26 NOTE — Progress Notes (Signed)
3 Month Follow Up   Assessment and Plan:   Rachel Daniel was seen today for follow-up.  Diagnoses and all orders for this visit:  Essential hypertension, benign Continue current medications: Lisinopril 10mg  Monitor blood pressure at home; call if consistently over 130/80 Continue DASH diet.   Reminder to go to the ER if any CP, SOB, nausea, dizziness, severe HA, changes vision/speech, left arm numbness and tingling and jaw pain. -     CBC with Differential/Platelet -     COMPLETE METABOLIC PANEL WITH GFR -     Magnesium  Hyperlipidemia associated with type 2 diabetes mellitus (HCC) No medications Discussed dietary and exercise modifications Low fat diet -     Lipid panel  Controlled type 2 diabetes mellitus with stage 2 chronic kidney disease, without long-term current use of insulin (HCC) Unable to tolerate metformin Has made some dietary changes. Discussed general issues about diabetes pathophysiology and management. Education: Reviewed 'ABCs' of diabetes management (respective goals in parentheses):  A1C (<7), blood pressure (<130/80), and cholesterol (LDL <70) Dietary recommendations Encouraged aerobic exercise.  Discussed foot care, check daily Yearly retinal exam Dental exam every 6 months Monitor blood glucose, discussed goal for patient -     Hemoglobin A1c  Diabetic polyneuropathy associated with type 2 diabetes mellitus ([name]) Managing well at this time Continue to monitor  Gastroesophageal reflux disease with esophagitis, unspecified whether hemorrhage Doing well at this time Diet discussed Monitor for triggers Avoid food with high acid content Avoid excessive cafeine Increase water intake  Vitamin D deficiency Continue supplementation Taking Vitamin D 4,000 IU daily  CKD stage 2 due to type 2 diabetes mellitus (HCC) Increase fluids  Avoid NSAIDS Blood pressure control Monitor sugars  Will continue to monitor  Diverticulosis of large intestine without  hemorrhage Doing well at this time Continue to monitor  Restless leg syndrome Managing with Requip 3mg  at bedtime and 0.5mg  BID  Estrogen deficiency Due for DEXA  Overweight (BMI 25.0-29.9) Discussed dietary and exercise modifications  Medication management Continued  Otalgia of both ears Bilateral impacted cerumen -     Ear Lavage   Continue diet and meds as discussed. Further disposition pending results of labs. Discussed med's effects and SE's.  Patient agrees with plan of care and opportunity to ask questions/voice concerns. Over 30 minutes of chart review, face to face interview, exam, counseling, and critical decision making was performed.   Future Appointments  Date Time Provider [place]  05/01/2020  2:00 PM Vicie Mutters, PA-C [facility] None  09/25/2020 10:50 AM Alda Berthold, DO [facility] None    ----------------------------------------------------------------------------------------------------------------------  HPI 69 y.o. female  presents for 3 month follow up on HTN, HLD, abnormal glucose with history of pre-diabetes, weight and vitamin D deficiency.   She reports she is eating better. She has stopped drinking sodas and sugary drinks. She has increased to 5-6 glasses of water a day. She is not able to tolerate Metformin. LBM: Today She takes fiber daily, fiber sugar free gummies.    RLS:  Reports that she is taking Requip 0.5mg  BID and 3mg  at bedtime.  This is managed by Dr Posey Pronto, neurology.  Next OV is scheduled for 09/2020.  Reports discomfort in bilateral ears and some muffled sounds.  Reports she has had difficulties with cerumen in the past.  Denies any auricle pain dizziness or headaches.  BMI is Body mass index is 29.29 kg/m., she has been working on diet and exercise. Wt Readings from Last 3 Encounters:  01/27/20 176 lb (79.8 kg)  10/28/19 174 lb 12.8 oz (79.3 kg)  09/24/19 174 lb (78.9 kg)      Her blood pressure has been  controlled at home, today their BP is BP: 118/72  She does workout. She denies any cardiac symptoms, chest pains, palpitations, shortness of breath, dizziness or lower extremity edema.      She is not on cholesterol medication and denies myalgias.   Her cholesterol is not at goal. The cholesterol last visit was:   Lab Results  Component Value Date   CHOL 175 10/28/2019   HDL 42 (L) 10/28/2019   LDLCALC 107 (H) 10/28/2019   TRIG 148 10/28/2019   CHOLHDL 4.2 10/28/2019    She has been working on diet and exercise for diabetes (A1c 6.5% / 2016 & 6.8% / 2016), and denies nausea, paresthesia of the feet, polydipsia, polyuria, visual disturbances, vomiting and weight loss. Last A1C in the office was not to goal. Lab Results  Component Value Date   HGBA1C 8.1 (H) 10/28/2019     Patient does have history of CKD.  Their last GFR was:  Lab Results  Component Value Date   GFRNONAA 51 (L) 10/28/2019   Lab Results  Component Value Date   GFRAA 59 (L) 10/28/2019     Patient is on Vitamin D supplement for deficiency and takes 4,000IU daily. Lab Results  Component Value Date   VD25OH 65 10/28/2019        Current Medications:  Current Outpatient Medications on File Prior to Visit  Medication Sig  . aspirin EC 81 MG tablet Take 81 mg by mouth daily.  Marland Kitchen CRANBERRY EXTRACT PO Take 2 tablets by mouth daily.   . cyclobenzaprine (FLEXERIL) 10 MG tablet Take 1 tablet (10 mg total) by mouth 3 (three) times daily as needed for muscle spasms. Take 1/2-1 tab  . FIBER ADULT GUMMIES PO Take by mouth daily.  Marland Kitchen ibuprofen (ADVIL,MOTRIN) 600 MG tablet Take 600 mg by mouth every morning.  Marland Kitchen lisinopril (ZESTRIL) 10 MG tablet Take 1 tablet Daily for BP  . Multiple Vitamins-Minerals (MULTIVITAMIN PO) Take by mouth daily.  Marland Kitchen OVER THE COUNTER MEDICATION Uses OTC eye drops for dry eyes.  . Phenazopyridine HCl (AZO TABS PO) Take 2 tablets by mouth daily.  Marland Kitchen rOPINIRole (REQUIP) 0.5 MG tablet TAKE 1 TABLET  BY MOUTH EVERY DAY AT 11AM  . rOPINIRole (REQUIP) 3 MG tablet Take 1 tablet at Bedtime for Restless Legs  . VITAMIN D, CHOLECALCIFEROL, PO Take 4,000 Units by mouth daily.    No current facility-administered medications on file prior to visit.    Allergies:  Allergies  Allergen Reactions  . Hctz [Hydrochlorothiazide]     Leg cramping  . Metformin And Related Nausea Only  . Phenylbutazones Swelling  . Tropicamide Swelling    Red /itching/watery eyes      Medical History:  Past Medical History:  Diagnosis Date  . Allergy    otc med prn  . Arthritis    hips, lower back  . Cataract    bilateral  . Chronic kidney disease    stage 3 per patient   . Diet-controlled type 2 diabetes mellitus (HCC)    Diet controlled, no meds  . Diverticulosis   . Dribbling urine   . EKG abnormalities    pt states she had a BBB on her last EKG, no problems  . Essential hypertension, benign 08/19/2013  . HPV in female   . Hyperlipidemia  diet controlled, no med  . Restless leg syndrome   . Snoring   . Vitamin D deficiency      Family history- Reviewed and unchanged Family History  Problem Relation Age of Onset  . Hypertension Mother   . Diabetes Mother   . Stroke Mother   . Heart failure Mother   . Colon cancer Mother 58  . Cataracts Mother   . Heart failure Father 21  . Stroke Sister 68  . Colon polyps Sister   . Atrial fibrillation Sister   . Diabetes Sister   . Colon polyps Brother   . Atrial fibrillation Brother   . Atrial fibrillation Brother   . Atrial fibrillation Sister   . Diabetes Sister   . Stomach cancer Neg Hx   . Esophageal cancer Neg Hx   . Rectal cancer Neg Hx   . Amblyopia Neg Hx   . Blindness Neg Hx   . Glaucoma Neg Hx   . Macular degeneration Neg Hx   . Retinal detachment Neg Hx   . Strabismus Neg Hx   . Retinitis pigmentosa Neg Hx   . Breast cancer Neg Hx      Social history- Reviewed and unchanged Social History   Tobacco Use  . Smoking  status: Passive Smoke Exposure - Never Smoker  . Smokeless tobacco: Never Used  Substance Use Topics  . Alcohol use: No    Alcohol/week: 0.0 standard drinks  . Drug use: No     Screening Tests: Immunization History  Administered Date(s) Administered  . Influenza, High Dose Seasonal PF 08/01/2017, 09/22/2018, 07/20/2019  . Pneumococcal Conjugate-13 04/05/2016  . Pneumococcal Polysaccharide-23 04/15/2017  . Tdap 03/07/2011     Vaccinations: TD or Tdap:Due  2022  Influenza: Due for 2021  Pneumococcal: 2018 Prevnar13: 2017 Shingles: Zostavax/Shingrix: Discussed with patient, information provided. COVID19   02/17/2020 Scheduled   Preventative Care: Last colonoscopy: 2017 Last mammogram: 06/2019, due for 2021 Last pap smear/pelvic exam: 2016  DEXA: Due, existing order Hep C screening [id number]): 03/2015 - Negative   Review of Systems:  Review of Systems  Constitutional: Negative for chills, diaphoresis, fever, malaise/fatigue and weight loss.  HENT: Negative for congestion, ear discharge, ear pain, hearing loss, nosebleeds, sinus pain, sore throat and tinnitus.   Eyes: Negative for blurred vision, double vision, photophobia, pain, discharge and redness.  Respiratory: Negative for cough, hemoptysis, sputum production, shortness of breath, wheezing and stridor.   Cardiovascular: Negative for chest pain, palpitations, orthopnea, claudication, leg swelling and PND.  Gastrointestinal: Negative for abdominal pain, blood in stool, constipation, diarrhea, heartburn, melena, nausea and vomiting.  Genitourinary: Negative for dysuria, flank pain, frequency, hematuria and urgency.  Musculoskeletal: Negative for back pain, falls, joint pain, myalgias and neck pain.  Skin: Negative for itching and rash.  Neurological: Negative for dizziness, tingling, tremors, sensory change, speech change, focal weakness, seizures, loss of consciousness, weakness and headaches.  Endo/Heme/Allergies:  Negative for environmental allergies and polydipsia. Does not bruise/bleed easily.  Psychiatric/Behavioral: Negative for depression, hallucinations, memory loss, substance abuse and suicidal ideas. The patient is not nervous/anxious and does not have insomnia.       Physical Exam: BP 118/72   Pulse 72   Temp (!) 97 F (36.1 C)   Resp 16   Ht 5\' 5"  (1.651 m)   Wt 176 lb (79.8 kg)   BMI 29.29 kg/m  Wt Readings from Last 3 Encounters:  01/27/20 176 lb (79.8 kg)  10/28/19 174 lb 12.8 oz (79.3 kg)  09/24/19 174 lb (78.9 kg)   General Appearance: Well nourished, in no apparent distress. Eyes: PERRLA, EOMs, conjunctiva no swelling or erythema Sinuses: No Frontal/maxillary tenderness ENT/Mouth: Ext aud canals clear, TMs without erythema, bulging. No erythema, swelling, or exudate on post pharynx.  Tonsils not swollen or erythematous. Hearing normal.  Neck: Supple, thyroid normal.  Respiratory: Respiratory effort normal, BS equal bilaterally without rales, rhonchi, wheezing or stridor.  Cardio: RRR with no MRGs. Brisk peripheral pulses without edema.  Abdomen: Soft, + BS.  Non tender, no guarding, rebound, hernias, masses. Lymphatics: Non tender without lymphadenopathy.  Musculoskeletal: Full ROM, 5/5 strength, Normal gait Skin: Warm, dry without rashes, lesions, ecchymosis.  Neuro: Cranial nerves intact. No cerebellar symptoms.  Psych: Awake and oriented X 3, normal affect, Insight and Judgment appropriate.    Garnet Sierras, NP [facility] 10:13 PM

## 2020-01-27 ENCOUNTER — Encounter: Payer: Self-pay | Admitting: Adult Health Nurse Practitioner

## 2020-01-27 ENCOUNTER — Other Ambulatory Visit: Payer: Self-pay

## 2020-01-27 ENCOUNTER — Ambulatory Visit (INDEPENDENT_AMBULATORY_CARE_PROVIDER_SITE_OTHER): Payer: Medicare Other | Admitting: Adult Health Nurse Practitioner

## 2020-01-27 VITALS — BP 118/72 | HR 72 | Temp 97.0°F | Resp 16 | Ht 65.0 in | Wt 176.0 lb

## 2020-01-27 DIAGNOSIS — E1169 Type 2 diabetes mellitus with other specified complication: Secondary | ICD-10-CM

## 2020-01-27 DIAGNOSIS — H9203 Otalgia, bilateral: Secondary | ICD-10-CM | POA: Diagnosis not present

## 2020-01-27 DIAGNOSIS — E1122 Type 2 diabetes mellitus with diabetic chronic kidney disease: Secondary | ICD-10-CM

## 2020-01-27 DIAGNOSIS — E785 Hyperlipidemia, unspecified: Secondary | ICD-10-CM | POA: Diagnosis not present

## 2020-01-27 DIAGNOSIS — Z79899 Other long term (current) drug therapy: Secondary | ICD-10-CM | POA: Diagnosis not present

## 2020-01-27 DIAGNOSIS — N182 Chronic kidney disease, stage 2 (mild): Secondary | ICD-10-CM

## 2020-01-27 DIAGNOSIS — I10 Essential (primary) hypertension: Secondary | ICD-10-CM

## 2020-01-27 DIAGNOSIS — H6123 Impacted cerumen, bilateral: Secondary | ICD-10-CM

## 2020-01-27 DIAGNOSIS — K5730 Diverticulosis of large intestine without perforation or abscess without bleeding: Secondary | ICD-10-CM

## 2020-01-27 DIAGNOSIS — E559 Vitamin D deficiency, unspecified: Secondary | ICD-10-CM

## 2020-01-27 DIAGNOSIS — E2839 Other primary ovarian failure: Secondary | ICD-10-CM

## 2020-01-27 DIAGNOSIS — E663 Overweight: Secondary | ICD-10-CM

## 2020-01-27 DIAGNOSIS — E1142 Type 2 diabetes mellitus with diabetic polyneuropathy: Secondary | ICD-10-CM

## 2020-01-27 DIAGNOSIS — G2581 Restless legs syndrome: Secondary | ICD-10-CM

## 2020-01-27 DIAGNOSIS — K2100 Gastro-esophageal reflux disease with esophagitis, without bleeding: Secondary | ICD-10-CM

## 2020-01-27 NOTE — Patient Instructions (Signed)
  Keep up the good work with changes to your diet.  Decreasing carbohydrates and sugars.  Find replacement food that are a healthier option.  Continue to increase your water intake.  This will help your kidneys to filter out toxins as well as improve swelling in your legs.  Increase the amount of walking you do everyday, will also help with swelling.   Ear care: Both of your ears are clear of wax at this time Use 2-3 drops of oil for next 3-4 nights.  This will help to sooth your ear canals.  Use cotton ball to keep in ears while sleeping.  For regular maintenance: Use warm or room temp peroxide in ear once every 1-2 weeks, then apply oil that night x1.   Next morning let warm water from shower run into your ears.   This will help flush out any wax Do not use cotton swabs in your ears Should you have pain, decrease in your ability to hear or dizziness please contact the office for an appointment.

## 2020-01-28 LAB — COMPLETE METABOLIC PANEL WITH GFR
AG Ratio: 1.2 (calc) (ref 1.0–2.5)
ALT: 23 U/L (ref 6–29)
AST: 22 U/L (ref 10–35)
Albumin: 4.3 g/dL (ref 3.6–5.1)
Alkaline phosphatase (APISO): 72 U/L (ref 37–153)
BUN/Creatinine Ratio: 17 (calc) (ref 6–22)
BUN: 17 mg/dL (ref 7–25)
CO2: 26 mmol/L (ref 20–32)
Calcium: 9.6 mg/dL (ref 8.6–10.4)
Chloride: 102 mmol/L (ref 98–110)
Creat: 1.01 mg/dL — ABNORMAL HIGH (ref 0.50–0.99)
GFR, Est African American: 66 mL/min/{1.73_m2} (ref >=60)
GFR, Est Non African American: 57 mL/min/{1.73_m2} — ABNORMAL LOW (ref >=60)
Globulin: 3.5 g/dL (calc) (ref 1.9–3.7)
Glucose, Bld: 133 mg/dL — ABNORMAL HIGH (ref 65–99)
Potassium: 4.5 mmol/L (ref 3.5–5.3)
Sodium: 136 mmol/L (ref 135–146)
Total Bilirubin: 0.4 mg/dL (ref 0.2–1.2)
Total Protein: 7.8 g/dL (ref 6.1–8.1)

## 2020-01-28 LAB — CBC WITH DIFFERENTIAL/PLATELET
Absolute Monocytes: 390 cells/uL (ref 200–950)
Basophils Absolute: 42 cells/uL (ref 0–200)
Basophils Relative: 0.7 %
Eosinophils Absolute: 162 cells/uL (ref 15–500)
Eosinophils Relative: 2.7 %
HCT: 42.8 % (ref 35.0–45.0)
Hemoglobin: 14.4 g/dL (ref 11.7–15.5)
Lymphs Abs: 2130 cells/uL (ref 850–3900)
MCH: 29.8 pg (ref 27.0–33.0)
MCHC: 33.6 g/dL (ref 32.0–36.0)
MCV: 88.4 fL (ref 80.0–100.0)
MPV: 9.7 fL (ref 7.5–12.5)
Monocytes Relative: 6.5 %
Neutro Abs: 3276 cells/uL (ref 1500–7800)
Neutrophils Relative %: 54.6 %
Platelets: 178 10*3/uL (ref 140–400)
RBC: 4.84 10*6/uL (ref 3.80–5.10)
RDW: 12.5 % (ref 11.0–15.0)
Total Lymphocyte: 35.5 %
WBC: 6 10*3/uL (ref 3.8–10.8)

## 2020-01-28 LAB — LIPID PANEL
Cholesterol: 189 mg/dL (ref <200)
HDL: 41 mg/dL — ABNORMAL LOW (ref >=50)
LDL Cholesterol (Calc): 122 mg/dL (calc) — ABNORMAL HIGH
Non-HDL Cholesterol (Calc): 148 mg/dL (calc) — ABNORMAL HIGH (ref <130)
Total CHOL/HDL Ratio: 4.6 (calc) (ref <5.0)
Triglycerides: 147 mg/dL (ref <150)

## 2020-01-28 LAB — HEMOGLOBIN A1C
Hgb A1c MFr Bld: 8.6 % of total Hgb — ABNORMAL HIGH (ref <5.7)
Mean Plasma Glucose: 200 (calc)
eAG (mmol/L): 11.1 (calc)

## 2020-01-28 LAB — MAGNESIUM: Magnesium: 2.2 mg/dL (ref 1.5–2.5)

## 2020-02-03 ENCOUNTER — Telehealth: Payer: Self-pay

## 2020-02-03 NOTE — Telephone Encounter (Signed)
-----   Message from Garnet Sierras, NP sent at 02/02/2020 10:10 PM EDT ----- Regarding: Patient contact, labs unviewed Patient did not view message from labs. Summary her A1c is up, would like to start [place] see below.  Samples should be set aside for her.  -Blood count is in normal range.  -Electrolytes, kidney and liver function are in normal range.  Increase water intake to improve kidney function.  -Your cholesterol:  The bad cholesterol, LDL, increased to 122!  -Your TSH, thyroid, is in normal range.  -A1c, looking at glucose in your blood over the past three months has increased to 8.6.    I know you are not able to tolerate metformin.  We should consider starting you on Farxiga.  This is a medication that will help your body to get rid of the excess glucose in your body.  It does this by increasing urination.  As your blood glucose levels get to a more normal range this side effect decreases.  This medication can also help with weight loss.   -I would start you on 5mg  tablet daily for one week.  If you tolerate the medication then 10mg  tablet daily.  We have samples in the office you can pick up  to take prior to picking up prescription and schedule follow up two weeks after starting medication.  What are your thoughts?

## 2020-02-03 NOTE — Telephone Encounter (Signed)
Pt has been made aware of lab results & voiced understandng

## 2020-02-15 DIAGNOSIS — Z23 Encounter for immunization: Secondary | ICD-10-CM | POA: Diagnosis not present

## 2020-02-22 ENCOUNTER — Ambulatory Visit (INDEPENDENT_AMBULATORY_CARE_PROVIDER_SITE_OTHER): Payer: Medicare Other | Admitting: Adult Health Nurse Practitioner

## 2020-02-22 ENCOUNTER — Encounter: Payer: Self-pay | Admitting: Adult Health Nurse Practitioner

## 2020-02-22 ENCOUNTER — Other Ambulatory Visit: Payer: Self-pay

## 2020-02-22 VITALS — BP 124/76 | HR 66 | Temp 97.3°F | Resp 16 | Ht 65.0 in | Wt 171.8 lb

## 2020-02-22 DIAGNOSIS — E1122 Type 2 diabetes mellitus with diabetic chronic kidney disease: Secondary | ICD-10-CM

## 2020-02-22 DIAGNOSIS — E1169 Type 2 diabetes mellitus with other specified complication: Secondary | ICD-10-CM

## 2020-02-22 DIAGNOSIS — N182 Chronic kidney disease, stage 2 (mild): Secondary | ICD-10-CM

## 2020-02-22 DIAGNOSIS — E663 Overweight: Secondary | ICD-10-CM | POA: Diagnosis not present

## 2020-02-22 DIAGNOSIS — E1142 Type 2 diabetes mellitus with diabetic polyneuropathy: Secondary | ICD-10-CM

## 2020-02-22 DIAGNOSIS — E785 Hyperlipidemia, unspecified: Secondary | ICD-10-CM | POA: Diagnosis not present

## 2020-02-22 DIAGNOSIS — E1165 Type 2 diabetes mellitus with hyperglycemia: Secondary | ICD-10-CM

## 2020-02-22 MED ORDER — FARXIGA 10 MG PO TABS: 10.0000 mg | ORAL_TABLET | Freq: Every day | ORAL | 3 refills | Status: DC

## 2020-02-22 NOTE — Progress Notes (Signed)
3 Month Follow Up   Assessment and Plan:  Rachel Daniel was seen today for follow-up.  Diagnoses and all orders for this visit:  Uncontrolled type 2 diabetes mellitus with hyperglycemia ([place]) Contact pharmacy for medication cost Sent Pt with 3 weeks of samples Farxiga 10mg  once daily in am. If not covered check formulary and contact office so we can choose an affordable option. Discussed dietary and exercise modifications -     dapagliflozin propanediol (FARXIGA) 10 MG TABS tablet; Take 10 mg by mouth daily before breakfast. -     COMPLETE METABOLIC PANEL WITH GFR; Future Continue to check fasting blood glucose Monitor for urinary/candidiasis symptoms  Diabetic polyneuropathy associated with type 2 diabetes mellitus ([place]) Likely to improve with better glucose control Discussed dietary and exercise modifications  CKD stage 2 due to type 2 diabetes mellitus (HCC) Increase fluids  Avoid NSAIDS Blood pressure control Monitor sugars  Will continue to monitor  Hyperlipidemia associated with type 2 diabetes mellitus ([place]) Discussed dietary and exercise modifications Low fat diet  Overweight (BMI 25.0-29.9) BMI 28.5 today Continue dietary modifications Increase activity and water intake    Continue diet and meds as discussed. Further disposition pending results of labs. Discussed med's effects and SE's.  Patient agrees with plan of care and opportunity to ask questions/voice concerns.  Over 30 minutes of chart review, face to face interview, exam, counseling, and critical decision making was performed.   Future Appointments  Date Time Provider [place]  05/01/2020  2:00 PM Vicie Mutters, PA-C [facility] None  09/25/2020 10:50 AM Alda Berthold, DO [facility] None    ----------------------------------------------------------------------------------------------------------------------  HPI 69 y.o. female  presents for two week follow up after starting Farxiga.  Last OV  we started Farxia 5mg  for seven days and 10mg  for 7 days.  She reports that she Is tolerating this well.  She denies any side effects from the medications.  She endorses that she had an increase in urination.  Denies any dysuria or candidiasis.  She has been sensitive to candida infections in the past.  She reports she has been checking her fasting blood glucose and there has been a reduction from 160's-150's to 140's.  She did not bring a log with her today.  Reports she she feels good overall and happy that she has lost weight while taking this medication.  She has lost 5lbs over the course of three weeks.  She has tried metformin and metforminXRin the past and reports severe nausea and feeling bad while taking this.  She reports she is eating better. She has stopped drinking sodas and sugary drinks. She has increased to 5-6 glasses of water a day. She is not able to tolerate Metformin. LBM: Today She takes fiber daily, fiber sugar free gummies.     BMI is Body mass index is 28.59 kg/m., she has been working on diet and exercise. Wt Readings from Last 3 Encounters:  02/22/20 171 lb 12.8 oz (77.9 kg)  01/27/20 176 lb (79.8 kg)  10/28/19 174 lb 12.8 oz (79.3 kg)      Her blood pressure has been controlled at home, today their BP is BP: 124/76  She does workout. She denies any cardiac symptoms, chest pains, palpitations, shortness of breath, dizziness or lower extremity edema.      She is not on cholesterol medication. She has DMII and statin would be beneficial for risk reduction.  Current leg pains and RLS, does not want to start stain at this time.  Discussed with patient.  Her cholesterol is not at goal. The cholesterol last visit was:   Lab Results  Component Value Date   CHOL 175 10/28/2019   HDL 42 (L) 10/28/2019   LDLCALC 107 (H) 10/28/2019   TRIG 148 10/28/2019   CHOLHDL 4.2 10/28/2019    She has been working on diet and exercise for diabetes (A1c 6.5% / 2016 & 6.8% / 2016),  and denies nausea, paresthesia of the feet, polydipsia, polyuria, visual disturbances, vomiting and weight loss. Last A1C in the office was not to goal. Lab Results  Component Value Date   HGBA1C 8.1 (H) 10/28/2019     Patient does have history of CKD.  Their last GFR was:  Lab Results  Component Value Date   GFRNONAA 51 (L) 10/28/2019   Lab Results  Component Value Date   GFRAA 59 (L) 10/28/2019     Current Medications:  Current Outpatient Medications on File Prior to Visit  Medication Sig  . aspirin EC 81 MG tablet Take 81 mg by mouth daily.  Marland Kitchen CRANBERRY EXTRACT PO Take 2 tablets by mouth daily.   . cyclobenzaprine (FLEXERIL) 10 MG tablet Take 1 tablet (10 mg total) by mouth 3 (three) times daily as needed for muscle spasms. Take 1/2-1 tab  . FIBER ADULT GUMMIES PO Take by mouth daily.  Marland Kitchen ibuprofen (ADVIL,MOTRIN) 600 MG tablet Take 600 mg by mouth every morning.  Marland Kitchen lisinopril (ZESTRIL) 10 MG tablet Take 1 tablet Daily for BP  . Multiple Vitamins-Minerals (MULTIVITAMIN PO) Take by mouth daily.  Marland Kitchen OVER THE COUNTER MEDICATION Uses OTC eye drops for dry eyes.  . Phenazopyridine HCl (AZO TABS PO) Take 2 tablets by mouth daily.  Marland Kitchen rOPINIRole (REQUIP) 0.5 MG tablet TAKE 1 TABLET BY MOUTH EVERY DAY AT 11AM  . rOPINIRole (REQUIP) 3 MG tablet Take 1 tablet at Bedtime for Restless Legs  . VITAMIN D, CHOLECALCIFEROL, PO Take 6,000 Units by mouth daily.    No current facility-administered medications on file prior to visit.    Allergies:  Allergies  Allergen Reactions  . Hctz [Hydrochlorothiazide]     Leg cramping  . Metformin And Related Nausea Only  . Phenylbutazones Swelling  . Tropicamide Swelling    Red /itching/watery eyes      Medical History:  Past Medical History:  Diagnosis Date  . Allergy    otc med prn  . Arthritis    hips, lower back  . Cataract    bilateral  . Chronic kidney disease    stage 3 per patient   . Diet-controlled type 2 diabetes mellitus  (HCC)    Diet controlled, no meds  . Diverticulosis   . Dribbling urine   . EKG abnormalities    pt states she had a BBB on her last EKG, no problems  . Essential hypertension, benign 08/19/2013  . HPV in female   . Hyperlipidemia    diet controlled, no med  . Restless leg syndrome   . Snoring   . Vitamin D deficiency      Family history- Reviewed and unchanged Family History  Problem Relation Age of Onset  . Hypertension Mother   . Diabetes Mother   . Stroke Mother   . Heart failure Mother   . Colon cancer Mother 55  . Cataracts Mother   . Heart failure Father 35  . Stroke Sister 74  . Colon polyps Sister   . Atrial fibrillation Sister   . Diabetes Sister   .  Colon polyps Brother   . Atrial fibrillation Brother   . Atrial fibrillation Brother   . Atrial fibrillation Sister   . Diabetes Sister   . Stomach cancer Neg Hx   . Esophageal cancer Neg Hx   . Rectal cancer Neg Hx   . Amblyopia Neg Hx   . Blindness Neg Hx   . Glaucoma Neg Hx   . Macular degeneration Neg Hx   . Retinal detachment Neg Hx   . Strabismus Neg Hx   . Retinitis pigmentosa Neg Hx   . Breast cancer Neg Hx      Social history- Reviewed and unchanged Social History   Tobacco Use  . Smoking status: Passive Smoke Exposure - Never Smoker  . Smokeless tobacco: Never Used  Substance Use Topics  . Alcohol use: No    Alcohol/week: 0.0 standard drinks  . Drug use: No     Screening Tests: Immunization History  Administered Date(s) Administered  . Influenza, High Dose Seasonal PF 08/01/2017, 09/22/2018, 07/20/2019  . Pneumococcal Conjugate-13 04/05/2016  . Pneumococcal Polysaccharide-23 04/15/2017  . Tdap 03/07/2011     Vaccinations: TD or Tdap:Due  2022  Influenza: Due for 2021  Pneumococcal: 2018 Prevnar13: 2017 Shingles: Zostavax/Shingrix: Discussed with patient, information provided. COVID19   02/17/2020 Scheduled   Preventative Care: Last colonoscopy: 2017 Last mammogram:  06/2019, due for 2021 Last pap smear/pelvic exam: 2016  DEXA: Due, existing order Hep C screening [id number]): 03/2015 - Negative   Review of Systems:  Review of Systems  Constitutional: Negative for chills, diaphoresis, fever, malaise/fatigue and weight loss.  HENT: Negative for congestion, ear discharge, ear pain, hearing loss, nosebleeds, sinus pain, sore throat and tinnitus.   Eyes: Negative for blurred vision, double vision, photophobia, pain, discharge and redness.  Respiratory: Negative for cough, hemoptysis, sputum production, shortness of breath, wheezing and stridor.   Cardiovascular: Negative for chest pain, palpitations, orthopnea, claudication, leg swelling and PND.  Gastrointestinal: Negative for abdominal pain, blood in stool, constipation, diarrhea, heartburn, melena, nausea and vomiting.  Genitourinary: Negative for dysuria, flank pain, frequency, hematuria and urgency.  Musculoskeletal: Negative for back pain, falls, joint pain, myalgias and neck pain.  Skin: Negative for itching and rash.  Neurological: Negative for dizziness, tingling, tremors, sensory change, speech change, focal weakness, seizures, loss of consciousness, weakness and headaches.  Endo/Heme/Allergies: Negative for environmental allergies and polydipsia. Does not bruise/bleed easily.  Psychiatric/Behavioral: Negative for depression, hallucinations, memory loss, substance abuse and suicidal ideas. The patient is not nervous/anxious and does not have insomnia.       Physical Exam: BP 124/76   Pulse 66   Temp (!) 97.3 F (36.3 C)   Resp 16   Ht 5\' 5"  (1.651 m)   Wt 171 lb 12.8 oz (77.9 kg)   SpO2 99%   BMI 28.59 kg/m  Wt Readings from Last 3 Encounters:  02/22/20 171 lb 12.8 oz (77.9 kg)  01/27/20 176 lb (79.8 kg)  10/28/19 174 lb 12.8 oz (79.3 kg)   General Appearance: Well nourished, in no apparent distress. Eyes: PERRLA, EOMs, conjunctiva no swelling or erythema Sinuses: No Frontal/maxillary  tenderness ENT/Mouth: Ext aud canals clear, TMs without erythema, bulging. No erythema, swelling, or exudate on post pharynx.  Tonsils not swollen or erythematous. Hearing normal.  Respiratory: Respiratory effort normal, BS equal bilaterally without rales, rhonchi, wheezing or stridor.  Cardio: RRR with no MRGs. Brisk peripheral pulses without edema.  Musculoskeletal: Full ROM, 5/5 strength, Normal gait Skin: Warm, dry without rashes, lesions,  ecchymosis.  Neuro: Cranial nerves intact. No cerebellar symptoms.  Psych: Awake and oriented X 3, normal affect, Insight and Judgment appropriate.    Garnet Sierras, NP [facility] 10:00 AM

## 2020-03-01 ENCOUNTER — Other Ambulatory Visit: Payer: Self-pay

## 2020-03-01 DIAGNOSIS — E1165 Type 2 diabetes mellitus with hyperglycemia: Secondary | ICD-10-CM

## 2020-03-01 MED ORDER — FARXIGA 10 MG PO TABS: 10.0000 mg | ORAL_TABLET | Freq: Every day | ORAL | 3 refills | Status: DC

## 2020-03-07 ENCOUNTER — Other Ambulatory Visit: Payer: Self-pay

## 2020-03-07 ENCOUNTER — Other Ambulatory Visit: Payer: Medicare Other

## 2020-03-07 DIAGNOSIS — E1165 Type 2 diabetes mellitus with hyperglycemia: Secondary | ICD-10-CM | POA: Diagnosis not present

## 2020-03-07 LAB — COMPLETE METABOLIC PANEL WITH GFR
AG Ratio: 1.6 (calc) (ref 1.0–2.5)
ALT: 20 U/L (ref 6–29)
AST: 20 U/L (ref 10–35)
Albumin: 4.3 g/dL (ref 3.6–5.1)
Alkaline phosphatase (APISO): 68 U/L (ref 37–153)
BUN/Creatinine Ratio: 14 (calc) (ref 6–22)
BUN: 16 mg/dL (ref 7–25)
CO2: 26 mmol/L (ref 20–32)
Calcium: 9.5 mg/dL (ref 8.6–10.4)
Chloride: 102 mmol/L (ref 98–110)
Creat: 1.17 mg/dL — ABNORMAL HIGH (ref 0.50–0.99)
GFR, Est African American: 55 mL/min/{1.73_m2} — ABNORMAL LOW (ref >=60)
GFR, Est Non African American: 48 mL/min/{1.73_m2} — ABNORMAL LOW (ref >=60)
Globulin: 2.7 g/dL (calc) (ref 1.9–3.7)
Glucose, Bld: 96 mg/dL (ref 65–99)
Potassium: 4.4 mmol/L (ref 3.5–5.3)
Sodium: 135 mmol/L (ref 135–146)
Total Bilirubin: 0.5 mg/dL (ref 0.2–1.2)
Total Protein: 7 g/dL (ref 6.1–8.1)

## 2020-03-09 ENCOUNTER — Other Ambulatory Visit: Payer: Self-pay | Admitting: Adult Health Nurse Practitioner

## 2020-03-09 DIAGNOSIS — E1165 Type 2 diabetes mellitus with hyperglycemia: Secondary | ICD-10-CM

## 2020-03-09 MED ORDER — GLIPIZIDE 5 MG PO TABS: 5.0000 mg | ORAL_TABLET | Freq: Every day | ORAL | 3 refills | Status: DC

## 2020-03-09 NOTE — Progress Notes (Signed)
Spoke with patient via telephone conversation.  Her insurance does not cover farxiga.  Worsening kidney function after trial.  D/C.  Rx for glipizide 5mg  daily.  Discussed continuing to improve dietary choices.  Increase water intake.  Follow up appointment scheduled for 05/01/20 with our office, Rachel Lay, PA-C for complete physical.  Rachel Sierras, NP [facility] 03/09/2020  4:01 PM

## 2020-04-20 ENCOUNTER — Encounter: Payer: Medicare Other | Admitting: Physician Assistant

## 2020-04-28 NOTE — Progress Notes (Signed)
CPE  Assessment and Plan:  CPE  Essential hypertension, benign - continue medications, DASH diet, exercise and monitor at home. Call if greater than 130/80. - CBC with Differential/Platelet - CMP/GFR   Restless leg syndrome Continue requip   Hyperlipidemia associated with DM -adamantly declines statins; risk of MI/CVA discussed- will add on zetia 10 mg - check lipids, decrease fatty foods, increase activity.  - Lipid panel - TSH   Diverticulosis of large intestine without hemorrhage Remission, increase fiber  Vitamin D deficiency At goal at recent check; continue to recommend supplementation for goal of 60-100 Defer vitamin D level   Medication management - Magnesium  Gastroesophageal reflux disease with esophagitis Continue PPI/H2 blocker, diet discussed  Diabetic autonomic neuropathy associated with type 2 diabetes mellitus Do daily feet checks  Controlled type 2 diabetes mellitus with stage 2 chronic kidney disease, without long-term current use of insulin (HCC) -     COMPLETE METABOLIC PANEL WITH GFR -     Hemoglobin A1c  CKD stage 2 due to type 2 diabetes mellitus (HCC) -     COMPLETE METABOLIC PANEL WITH GFR -     Hemoglobin A1c Discussed general issues about diabetes pathophysiology and management., Educational material distributed., Suggested low cholesterol diet., Encouraged aerobic exercise., Discussed foot care., Reminded to get yearly retinal exam.  Overweight (BMI 25.0-29.9)  Overweight  - long discussion about weight loss, diet, and exercise -recommended diet heavy in fruits and veggies and low in animal meats, cheeses, and dairy products  Estrogen def Last dexa in 2008, DEXA ordered to be scheduled with next mammogram   Discussed med's effects and SE's. Screening labs and tests as requested with regular follow-up as recommended.  Future Appointments  Date Time Provider [place]  09/25/2020 10:50 AM Narda Amber K, DO [facility] None   05/02/2021  2:00 PM Vicie Mutters, PA-C [facility] None     HPI 69 y.o. female  presents for a 3 month follow up for diet controlled diabetes, hyperlipidemia, hypertension, vitamin D deficiency, CKD II and CPE   She retired May 2nd 2018, states she is doing well.   She had had bilateral leg swelling for years, left leg always worse than the right. No warmth, no pain, no redness. Improved with farxiga.   She is on requip for RLS which helps, she follows with Dr. Posey Pronto, on 3 at night and occ 0.25mg  during the day   BMI is Body mass index is 28.94 kg/m., she has not been working on diet and exercise. Wt Readings from Last 3 Encounters:  05/01/20 176 lb 9.6 oz (80.1 kg)  02/22/20 171 lb 12.8 oz (77.9 kg)  01/27/20 176 lb (79.8 kg)   She does not check BP at home due to always been steady, today their BP is BP: 124/70 She does not workout. She denies chest pain,  dizziness. She will have some SOB with dragging the trash can up hill in her driveway of 384 ft, no CP with it, no dizziness, nausea or fatigue.   She is not on cholesterol medication, she absolutely declines any medications due to her husband's very poor experience. Her cholesterol is not at goal of LDL <70. The cholesterol last visit was:   Lab Results  Component Value Date   CHOL 189 01/27/2020   HDL 41 (L) 01/27/2020   LDLCALC 122 (H) 01/27/2020   TRIG 147 01/27/2020   CHOLHDL 4.6 01/27/2020   She has been working on diet and exercise for diabetes  Dad  with CHF, mom and sister with Afib CKD stage III on ACE Neuropathy Hyperlipidemia she is not on cholesterol medication Eye exam 12/13/2019 no retinopathy Glipizide once at night with dinner- possibly 2-3 episodes of what feels like low sugars at 10AM-11AM while shopping- did not check sugars- ate food and felt better.  could not tolerate the metformin farxiga was too expensive but did help with swelling in her legs denies polydipsia, polyuria and visual disturbances.   She has glucometer  Last A1C in the office was:  Lab Results  Component Value Date   HGBA1C 8.6 (H) 01/27/2020   Lab Results  Component Value Date   GFRNONAA 48 (L) 03/07/2020   Patient is on Vitamin D supplement.   Lab Results  Component Value Date   VD25OH 65 10/28/2019      Current Medications:   Current Outpatient Medications (Endocrine & Metabolic):  .  glipiZIDE (GLUCOTROL) 5 MG tablet, Take 1 tablet (5 mg total) by mouth daily.  Current Outpatient Medications (Cardiovascular):  .  lisinopril (ZESTRIL) 10 MG tablet, Take 1 tablet Daily for BP   Current Outpatient Medications (Analgesics):  .  aspirin EC 81 MG tablet, Take 81 mg by mouth daily. Marland Kitchen  ibuprofen (ADVIL,MOTRIN) 600 MG tablet, Take 600 mg by mouth every morning.   Current Outpatient Medications (Other):  Marland Kitchen  CRANBERRY EXTRACT PO, Take 2 tablets by mouth daily.  .  cyclobenzaprine (FLEXERIL) 10 MG tablet, Take 1 tablet (10 mg total) by mouth 3 (three) times daily as needed for muscle spasms. Take 1/2-1 tab .  FIBER ADULT GUMMIES PO, Take by mouth daily. .  Multiple Vitamins-Minerals (MULTIVITAMIN PO), Take by mouth daily. Marland Kitchen  OVER THE COUNTER MEDICATION, Uses OTC eye drops for dry eyes. .  Phenazopyridine HCl (AZO TABS PO), Take 2 tablets by mouth daily. Marland Kitchen  rOPINIRole (REQUIP) 0.5 MG tablet, TAKE 1 TABLET BY MOUTH EVERY DAY AT 11AM .  rOPINIRole (REQUIP) 3 MG tablet, Take 1 tablet at Bedtime for Restless Legs .  VITAMIN D, CHOLECALCIFEROL, PO, Take 6,000 Units by mouth daily.   Health Maintenance:   Immunization History  Administered Date(s) Administered  . Influenza, High Dose Seasonal PF 08/01/2017, 09/22/2018, 07/20/2019  . Moderna SARS-COVID-2 Vaccination 01/18/2020, 02/15/2020  . Pneumococcal Conjugate-13 04/05/2016  . Pneumococcal Polysaccharide-23 04/15/2017  . Tdap 03/07/2011   Health Maintenance  Topic Date Due  . INFLUENZA VACCINE  05/14/2020  . FOOT EXAM  07/19/2020  . HEMOGLOBIN A1C   07/28/2020  . OPHTHALMOLOGY EXAM  12/12/2020  . MAMMOGRAM  06/30/2021  . TETANUS/TDAP  07/03/2021  . COLONOSCOPY  07/12/2026  . DEXA SCAN  Completed  . COVID-19 Vaccine  Completed  . Hepatitis C Screening  Completed  . PNA vac Low Risk Adult  Completed   Tetanus: 2012 Pneumovax: 2018 Prevnar 13: 2017 Flu vaccine: 2019  Zostavax: declines  Pap: 2016 negative MGM: 06/2019 will get this year DEXA: 2008 normal, declines for now Colonoscopy: 06/2016, Dr. Deatra Ina EGD: N/A MRI lumbar spine 2013 CXR 2017  Eye exam: Dr. Rodman Key 12/2019  Dentist: Dr. Harrington Challenger q 6 months 2020  Patient Care Team: Unk Pinto, MD as PCP - General (Internal Medicine) Unk Pinto, MD as PCP - Internal Medicine (Internal Medicine) Phylliss Bob, MD as Consulting Physician (Orthopedic Surgery)   Allergies:  Allergies  Allergen Reactions  . Hctz [Hydrochlorothiazide]     Leg cramping  . Metformin And Related Nausea Only  . Phenylbutazones Swelling  . Tropicamide Swelling  Red /itching/watery eyes   Medical History:  Past Medical History:  Diagnosis Date  . Allergy    otc med prn  . Arthritis    hips, lower back  . Cataract    bilateral  . Chronic kidney disease    stage 3 per patient   . Diet-controlled type 2 diabetes mellitus (HCC)    Diet controlled, no meds  . Diverticulosis   . Dribbling urine   . EKG abnormalities    pt states she had a BBB on her last EKG, no problems  . Essential hypertension, benign 08/19/2013  . HPV in female   . Hyperlipidemia    diet controlled, no med  . Restless leg syndrome   . Snoring   . Vitamin D deficiency    Surgical History:  She  has a past surgical history that includes Vaginal hysterectomy (1992); Tubal ligation (1981); Ovarian cyst removal (1981); Lumbar laminectomy/decompression microdiscectomy (05/07/2012); Colonoscopy (2012); and Breast biopsy (Left, 1969).  Family History:  Her family history includes Atrial fibrillation in her  brother, brother, sister, and sister; Cataracts in her mother; Colon cancer (age of onset: 51) in her mother; Colon polyps in her brother and sister; Diabetes in her mother, sister, and sister; Heart failure in her mother; Heart failure (age of onset: 24) in her father; Hypertension in her mother; Stroke in her mother; Stroke (age of onset: 78) in her sister.   Social History:  She  reports that she is a non-smoker but has been exposed to tobacco smoke. She has never used smokeless tobacco. She reports that she does not drink alcohol and does not use drugs.   Review of Systems  Constitutional: Negative.   HENT: Negative.   Eyes: Negative.  Negative for blurred vision.  Respiratory: Negative for cough, hemoptysis, sputum production, shortness of breath and wheezing.   Cardiovascular: Negative.  Negative for chest pain.  Gastrointestinal: Negative.   Genitourinary: Negative.        Stress incontinence  Musculoskeletal: Positive for myalgias (bilateral legs). Negative for back pain, falls, joint pain and neck pain.  Skin: Negative for itching and rash.  Neurological: Positive for tingling and sensory change (bilateral feet).  Psychiatric/Behavioral: Negative.    Physical Exam: Estimated body mass index is 28.94 kg/m as calculated from the following:   Height as of this encounter: 5' 5.5" (1.664 m).   Weight as of this encounter: 176 lb 9.6 oz (80.1 kg). BP 124/70   Pulse 81   Temp (!) 97.1 F (36.2 C)   Ht 5' 5.5" (1.664 m)   Wt 176 lb 9.6 oz (80.1 kg)   SpO2 98%   BMI 28.94 kg/m  General Appearance: Well nourished, in no apparent distress. Eyes: PERRLA, EOMs, conjunctiva no swelling or erythema, normal fundi and vessels. Sinuses: No Frontal/maxillary tenderness ENT/Mouth: Ext aud canals clear, normal light reflex with TMs without erythema, bulging.  Good dentition. No erythema, swelling, or exudate on post pharynx. Tonsils not swollen or erythematous. Hearing normal.  Neck:  Supple, thyroid normal. No bruits Respiratory: Respiratory effort normal, BS equal bilaterally without rales, rhonchi, wheezing or stridor. Cardio: RRR without murmurs, rubs or gallops. Brisk peripheral pulses without edema.  Chest: symmetric, with normal excursions and percussion. Breasts: Symmetric, without lumps, nipple discharge, retractions. Abdomen: Soft, +BS, Non tender, no guarding, rebound, hernias, masses, or organomegaly. .  Lymphatics: Non tender without lymphadenopathy.  Musculoskeletal: Full ROM all peripheral extremities,5/5 strength, and normal gait.  Skin: + varicose veins bilateral legs. Warm,  dry without rashes, lesions, ecchymosis.  Neuro: Cranial nerves intact, reflexes equal bilaterally. Normal muscle tone, no cerebellar symptoms. Sensation intact Psych: Awake and oriented X 3, normal affect, Insight and Judgment appropriate.    Vicie Mutters 2:18 PM

## 2020-05-01 ENCOUNTER — Other Ambulatory Visit: Payer: Self-pay

## 2020-05-01 ENCOUNTER — Ambulatory Visit (INDEPENDENT_AMBULATORY_CARE_PROVIDER_SITE_OTHER): Payer: Medicare Other | Admitting: Physician Assistant

## 2020-05-01 ENCOUNTER — Encounter: Payer: Self-pay | Admitting: Physician Assistant

## 2020-05-01 VITALS — BP 124/70 | HR 81 | Temp 97.1°F | Ht 65.5 in | Wt 176.6 lb

## 2020-05-01 DIAGNOSIS — Z79899 Other long term (current) drug therapy: Secondary | ICD-10-CM

## 2020-05-01 DIAGNOSIS — Z136 Encounter for screening for cardiovascular disorders: Secondary | ICD-10-CM | POA: Diagnosis not present

## 2020-05-01 DIAGNOSIS — I10 Essential (primary) hypertension: Secondary | ICD-10-CM | POA: Diagnosis not present

## 2020-05-01 DIAGNOSIS — N182 Chronic kidney disease, stage 2 (mild): Secondary | ICD-10-CM | POA: Diagnosis not present

## 2020-05-01 DIAGNOSIS — G2581 Restless legs syndrome: Secondary | ICD-10-CM

## 2020-05-01 DIAGNOSIS — K5730 Diverticulosis of large intestine without perforation or abscess without bleeding: Secondary | ICD-10-CM | POA: Diagnosis not present

## 2020-05-01 DIAGNOSIS — E559 Vitamin D deficiency, unspecified: Secondary | ICD-10-CM | POA: Diagnosis not present

## 2020-05-01 DIAGNOSIS — E348 Other specified endocrine disorders: Secondary | ICD-10-CM

## 2020-05-01 DIAGNOSIS — E663 Overweight: Secondary | ICD-10-CM | POA: Diagnosis not present

## 2020-05-01 DIAGNOSIS — E785 Hyperlipidemia, unspecified: Secondary | ICD-10-CM

## 2020-05-01 DIAGNOSIS — E1169 Type 2 diabetes mellitus with other specified complication: Secondary | ICD-10-CM | POA: Diagnosis not present

## 2020-05-01 DIAGNOSIS — E1122 Type 2 diabetes mellitus with diabetic chronic kidney disease: Secondary | ICD-10-CM | POA: Diagnosis not present

## 2020-05-01 DIAGNOSIS — G5713 Meralgia paresthetica, bilateral lower limbs: Secondary | ICD-10-CM | POA: Diagnosis not present

## 2020-05-01 DIAGNOSIS — E1165 Type 2 diabetes mellitus with hyperglycemia: Secondary | ICD-10-CM | POA: Diagnosis not present

## 2020-05-01 DIAGNOSIS — E119 Type 2 diabetes mellitus without complications: Secondary | ICD-10-CM

## 2020-05-01 DIAGNOSIS — E1142 Type 2 diabetes mellitus with diabetic polyneuropathy: Secondary | ICD-10-CM | POA: Diagnosis not present

## 2020-05-01 DIAGNOSIS — Z0001 Encounter for general adult medical examination with abnormal findings: Secondary | ICD-10-CM

## 2020-05-01 MED ORDER — EZETIMIBE 10 MG PO TABS: 10.0000 mg | ORAL_TABLET | Freq: Every day | ORAL | 11 refills | Status: DC

## 2020-05-01 NOTE — Patient Instructions (Addendum)
Use a dropper or use a cap to put peroxide, olive oil,mineral oil or canola oil in the effected ear- 2-3 times a week. Let it soak for 20-30 min then you can take a shower or use a baby bulb with warm water to wash out the ear wax.  Can buy debrox wax removal kit over the counter.  Do not use Qtips  VENOUS INSUFFICIENCY Our lower leg venous system is not the most reliable, the heart does NOT pump fluid up, there is a valve system.  The muscles of the leg squeeze and the blood moves up and a valve opens and close, then they squeeze, blood moves up and valves open and closes keeping the blood moving towards the heart.  Lots can go wrong with this valve system.  If someone is sitting or standing without movement, everyone will get swelling.  THINGS TO DO:  Do not stand or sit in one position for long periods of time. Do not sit with your legs crossed. Rest with your legs raised during the day.  Your legs have to be higher than your heart so that gravity will force the valves to open, so please really elevate your legs.   Wear elastic stockings or support hose. Do not wear other tight, encircling garments around the legs, pelvis, or waist.  [employer]  has a wide variety of well priced compression stockings. [place], [place] [id number] [id number] [place] has a good cheap selection, I like the socks, they are not as hard to get on  Walk as much as possible to increase blood flow.  Raise the foot of your bed at night with 2-inch blocks.  SEEK MEDICAL CARE IF:   The skin around your ankle starts to break down.  You have pain, redness, tenderness, or hard swelling developing in your leg over a vein.  You are uncomfortable due to leg pain.  If you ever have shortness of breath with exertion or chest pain go to the ER.    Try the zetia 10 mg for your cholesterol.   Your LDL is not in range or at goal, goal is less than 70.  Your LDL is the bad cholesterol that  can lead to heart attack and stroke. To lower your number you can decrease your fatty foods, red meat, cheese, milk and increase fiber like whole grains and veggies. You can also add a fiber supplement like Citracel or Benefiber, these do not cause gas and bloating and are safe to use. Since you have risk factors that make [name] want your number below 70, we need to adjust or add medications to get your number below goal.   I'm going to place an order for a cardiac calcium score, this will help [name] quantify how much calcification if any you have in your coronary arteries and can help [name] decide if we need to do aggressive medical management like cholesterol medication.  It will be 99 dollars self pay.   Targets for Glucose Readings: Time of Check Usual Target for Most People  Before Meals  70-130  Two hours after meals  Less than 180  Bedtime  90-150   Why Should You Check Your Blood Glucose? -The A1C tells you how your diabetes is doing over a 3 month period.  Home blood glucose monitoring (or checking) gives you information about your diabetes on a daily basis.  You will learn how well your diabetes care plan is working  and whether your blood glucose is in your target range throughout the day.   -Reviewing daily blood glucose levels will help you and your healthcare team make any needed changes to you meal plan, physical activity and medications.  Meter Supplies: - A glucose meter was provided at your visit today along with strips and lancets. The blood glucose meter strips and lancets are usually covered by health insurance. Please let us know if they are not and contact your insurance to see which meter they prefer.  How to get a good blood sample: -Wash your hands in warm water.  You do not need to use alcohol wipes. -Massage your hands. -Choose which finger you will use.  It helps to use a different finger each time to avoid soreness. -Keep you hand below your wrist when using you lancet to  "prick" your finger. -Apply gentle pressure, but do NOT squeeze your finger.  Checking your blood glucose Important Reminders: -Make sure your strips are not expired.  Check the date on the bottle. -Make sure the code on bottle matches the code on your machine. -Make sure your hands are clean and dry. -Do not use the center of your finger, it is the most sensitive area.  Use a spot to the side of the center of your fingertip. -Completely fill the strip target area with blood (until it beeps) to make sure the results are accurate. -You will need a prescription to have your glucose strips and lancets covered by insurance. -There is usually an 800 number on the back of your meter for help with meter issues.  How often to check: -If you take diabetes pills or take one injection of insulin each day, you will usually be asked to check twice a day, before breakfast and 2 hours after one meal, or as directed. -If you take several insulin injections each day, you will usually be asked to check four times a day before meals and at bedtime every day.

## 2020-05-02 ENCOUNTER — Other Ambulatory Visit: Payer: Self-pay | Admitting: Internal Medicine

## 2020-05-02 DIAGNOSIS — G2581 Restless legs syndrome: Secondary | ICD-10-CM

## 2020-05-02 LAB — LIPID PANEL
Cholesterol: 168 mg/dL (ref <200)
HDL: 45 mg/dL — ABNORMAL LOW (ref >=50)
LDL Cholesterol (Calc): 103 mg/dL (calc) — ABNORMAL HIGH
Non-HDL Cholesterol (Calc): 123 mg/dL (calc) (ref <130)
Total CHOL/HDL Ratio: 3.7 (calc) (ref <5.0)
Triglycerides: 107 mg/dL (ref <150)

## 2020-05-02 LAB — COMPLETE METABOLIC PANEL WITH GFR
AG Ratio: 1.5 (calc) (ref 1.0–2.5)
ALT: 21 U/L (ref 6–29)
AST: 17 U/L (ref 10–35)
Albumin: 4.3 g/dL (ref 3.6–5.1)
Alkaline phosphatase (APISO): 69 U/L (ref 37–153)
BUN/Creatinine Ratio: 12 (calc) (ref 6–22)
BUN: 14 mg/dL (ref 7–25)
CO2: 28 mmol/L (ref 20–32)
Calcium: 9.4 mg/dL (ref 8.6–10.4)
Chloride: 106 mmol/L (ref 98–110)
Creat: 1.21 mg/dL — ABNORMAL HIGH (ref 0.50–0.99)
GFR, Est African American: 53 mL/min/{1.73_m2} — ABNORMAL LOW (ref >=60)
GFR, Est Non African American: 46 mL/min/{1.73_m2} — ABNORMAL LOW (ref >=60)
Globulin: 2.8 g/dL (calc) (ref 1.9–3.7)
Glucose, Bld: 152 mg/dL — ABNORMAL HIGH (ref 65–99)
Potassium: 4.2 mmol/L (ref 3.5–5.3)
Sodium: 137 mmol/L (ref 135–146)
Total Bilirubin: 0.3 mg/dL (ref 0.2–1.2)
Total Protein: 7.1 g/dL (ref 6.1–8.1)

## 2020-05-02 LAB — CBC WITH DIFFERENTIAL/PLATELET
Absolute Monocytes: 401 cells/uL (ref 200–950)
Basophils Absolute: 41 cells/uL (ref 0–200)
Basophils Relative: 0.7 %
Eosinophils Absolute: 148 cells/uL (ref 15–500)
Eosinophils Relative: 2.5 %
HCT: 41.6 % (ref 35.0–45.0)
Hemoglobin: 13.9 g/dL (ref 11.7–15.5)
Lymphs Abs: 1882 cells/uL (ref 850–3900)
MCH: 29.7 pg (ref 27.0–33.0)
MCHC: 33.4 g/dL (ref 32.0–36.0)
MCV: 88.9 fL (ref 80.0–100.0)
MPV: 9.8 fL (ref 7.5–12.5)
Monocytes Relative: 6.8 %
Neutro Abs: 3428 cells/uL (ref 1500–7800)
Neutrophils Relative %: 58.1 %
Platelets: 181 10*3/uL (ref 140–400)
RBC: 4.68 10*6/uL (ref 3.80–5.10)
RDW: 12.6 % (ref 11.0–15.0)
Total Lymphocyte: 31.9 %
WBC: 5.9 10*3/uL (ref 3.8–10.8)

## 2020-05-02 LAB — URINALYSIS, ROUTINE W REFLEX MICROSCOPIC
Bilirubin Urine: NEGATIVE
Glucose, UA: NEGATIVE
Hgb urine dipstick: NEGATIVE
Ketones, ur: NEGATIVE
Leukocytes,Ua: NEGATIVE
Nitrite: NEGATIVE
Protein, ur: NEGATIVE
Specific Gravity, Urine: 1.006 (ref 1.001–1.03)
pH: 6 (ref 5.0–8.0)

## 2020-05-02 LAB — MAGNESIUM: Magnesium: 2.1 mg/dL (ref 1.5–2.5)

## 2020-05-02 LAB — TSH: TSH: 1.16 mIU/L (ref 0.40–4.50)

## 2020-05-02 LAB — MICROALBUMIN / CREATININE URINE RATIO
Creatinine, Urine: 21 mg/dL (ref 20–275)
Microalb, Ur: <0.2 mg/dL

## 2020-05-02 LAB — HEMOGLOBIN A1C
Hgb A1c MFr Bld: 7.1 % of total Hgb — ABNORMAL HIGH (ref <5.7)
Mean Plasma Glucose: 157 (calc)
eAG (mmol/L): 8.7 (calc)

## 2020-05-02 LAB — VITAMIN D 25 HYDROXY (VIT D DEFICIENCY, FRACTURES): Vit D, 25-Hydroxy: 72 ng/mL (ref 30–100)

## 2020-05-03 ENCOUNTER — Other Ambulatory Visit: Payer: Self-pay | Admitting: *Deleted

## 2020-05-03 DIAGNOSIS — G2581 Restless legs syndrome: Secondary | ICD-10-CM

## 2020-05-03 MED ORDER — ROPINIROLE HCL 3 MG PO TABS: ORAL_TABLET | ORAL | 1 refills | Status: DC

## 2020-05-11 ENCOUNTER — Other Ambulatory Visit: Payer: Self-pay | Admitting: Physician Assistant

## 2020-05-11 DIAGNOSIS — Z1231 Encounter for screening mammogram for malignant neoplasm of breast: Secondary | ICD-10-CM

## 2020-07-25 ENCOUNTER — Other Ambulatory Visit: Payer: Self-pay | Admitting: Internal Medicine

## 2020-08-07 ENCOUNTER — Other Ambulatory Visit: Payer: Self-pay | Admitting: Physician Assistant

## 2020-08-07 DIAGNOSIS — E2839 Other primary ovarian failure: Secondary | ICD-10-CM

## 2020-08-10 ENCOUNTER — Ambulatory Visit
Admission: RE | Admit: 2020-08-10 | Discharge: 2020-08-10 | Disposition: A | Discharge status: Home / Self Care | Payer: Medicare Other | Source: Ambulatory Visit | Attending: Physician Assistant | Admitting: Physician Assistant

## 2020-08-10 ENCOUNTER — Other Ambulatory Visit: Payer: Self-pay

## 2020-08-10 ENCOUNTER — Encounter: Payer: Self-pay | Admitting: Adult Health

## 2020-08-10 DIAGNOSIS — Z780 Asymptomatic menopausal state: Secondary | ICD-10-CM | POA: Diagnosis not present

## 2020-08-10 DIAGNOSIS — M8580 Other specified disorders of bone density and structure, unspecified site: Secondary | ICD-10-CM | POA: Insufficient documentation

## 2020-08-10 DIAGNOSIS — Z1231 Encounter for screening mammogram for malignant neoplasm of breast: Secondary | ICD-10-CM | POA: Diagnosis not present

## 2020-08-10 DIAGNOSIS — M85852 Other specified disorders of bone density and structure, left thigh: Secondary | ICD-10-CM | POA: Diagnosis not present

## 2020-08-10 DIAGNOSIS — E2839 Other primary ovarian failure: Secondary | ICD-10-CM

## 2020-08-15 ENCOUNTER — Ambulatory Visit (INDEPENDENT_AMBULATORY_CARE_PROVIDER_SITE_OTHER): Payer: Medicare Other | Admitting: Internal Medicine

## 2020-08-15 ENCOUNTER — Other Ambulatory Visit: Payer: Self-pay

## 2020-08-15 ENCOUNTER — Encounter: Payer: Self-pay | Admitting: Internal Medicine

## 2020-08-15 VITALS — BP 124/74 | HR 71 | Temp 97.8°F | Resp 16 | Ht 65.5 in | Wt 179.8 lb

## 2020-08-15 DIAGNOSIS — N1831 Chronic kidney disease, stage 3a: Secondary | ICD-10-CM

## 2020-08-15 DIAGNOSIS — Z79899 Other long term (current) drug therapy: Secondary | ICD-10-CM

## 2020-08-15 DIAGNOSIS — E785 Hyperlipidemia, unspecified: Secondary | ICD-10-CM | POA: Diagnosis not present

## 2020-08-15 DIAGNOSIS — I10 Essential (primary) hypertension: Secondary | ICD-10-CM

## 2020-08-15 DIAGNOSIS — E1169 Type 2 diabetes mellitus with other specified complication: Secondary | ICD-10-CM

## 2020-08-15 DIAGNOSIS — E1165 Type 2 diabetes mellitus with hyperglycemia: Secondary | ICD-10-CM | POA: Diagnosis not present

## 2020-08-15 DIAGNOSIS — E559 Vitamin D deficiency, unspecified: Secondary | ICD-10-CM

## 2020-08-15 DIAGNOSIS — E1122 Type 2 diabetes mellitus with diabetic chronic kidney disease: Secondary | ICD-10-CM

## 2020-08-15 MED ORDER — GLIPIZIDE 5 MG PO TABS: ORAL_TABLET | ORAL | 0 refills | Status: DC

## 2020-08-15 NOTE — Progress Notes (Signed)
History of Present Illness:       This very nice 69 y.o. WWF presents for 3 month follow up with HTN, HLD, T2_NIDDM and Vitamin D Deficiency.       Patient is treated for HTN  (2018)  & BP has been controlled at home. Today's BP  is at goal -  124/74. Patient has had no complaints of any cardiac type chest pain, palpitations, dyspnea / orthopnea / PND, dizziness, claudication, or dependent edema.      Hyperlipidemia is controlled with diet. Last Lipids were near goal:  Lab Results  Component Value Date   CHOL 168 05/01/2020   HDL 45 (L) 05/01/2020   LDLCALC 103 (H) 05/01/2020   TRIG 107 05/01/2020   CHOLHDL 3.7 05/01/2020    Also, the patient has history of T2_NIDDM  (2001)  w/CVD3a (GFR 46)  Managed with diet for years until started  Glipizide in May of this year.   She has had no symptoms of reactive hypoglycemia, diabetic polys, paresthesias or visual blurring.  Last A1c was not at goal taking 1 glipizide /day.   Lab Results  Component Value Date   HGBA1C 7.1 (H) 05/01/2020       Further, the patient also has history of Vitamin D Deficiency and supplements vitamin D without any suspected side-effects. Last vitamin D was at goal:   Lab Results  Component Value Date   VD25OH 72 05/01/2020    Current Outpatient Medications on File Prior to Visit  Medication Sig  . aspirin EC 81 MG tablet Take  daily.  Marland Kitchen CRANBERRY EXTRACT P Take 2 tablets daily.   . cyclobenzaprine  10 MG tablet Take 1 tablet 3  times daily as needed   . ezetimibe10 MG tablet Take 1 tablet  daily.  Marland Kitchen FIBER ADULT GUMMIES  Take  daily.  Marland Kitchen glipiZIDE 5 MG tablet Take 1 tablet  daily.  Marland Kitchen ibuprofen 600 MG tablet Take  every morning.  Marland Kitchen lisinopril 10 MG tablet Take     1 tablet      Daily      for BP  . Multiple Vitamins-Minerals  Take  daily.  . OTC eye drops Uses  for dry eyes.  . Phenazopyridine HCl  Take 2 tablets  daily.  Marland Kitchen rOPINIRole  3 MG tablet TAKE 1 TABLET  AT BEDTIME   . VITAMIN D Take 6,000  Units  daily.     Allergies  Allergen Reactions  . Hctz [Hydrochlorothiazide]     Leg cramping  . Metformin And Related Nausea Only  . Phenylbutazones Swelling  . Tropicamide Swelling    Red /itching/watery eyes    PMHx:   Past Medical History:  Diagnosis Date  . Allergy    otc med prn  . Arthritis    hips, lower back  . Cataract    bilateral  . Chronic kidney disease    stage 3 per patient   . Diet-controlled type 2 diabetes mellitus (HCC)    Diet controlled, no meds  . Diverticulosis   . Dribbling urine   . EKG abnormalities    pt states she had a BBB on her last EKG, no problems  . Essential hypertension, benign 08/19/2013  . HPV in female   . Hyperlipidemia    diet controlled, no med  . Restless leg syndrome   . Snoring   . Vitamin D deficiency     Immunization History  Administered Date(s) Administered  .  Influenza, High Dose Seasonal PF 08/01/2017, 09/22/2018, 07/20/2019  . Moderna SARS-COVID-2 Vaccination 01/18/2020, 02/15/2020  . Pneumococcal Conjugate-13 04/05/2016  . Pneumococcal Polysaccharide-23 04/15/2017  . Tdap 03/07/2011    Past Surgical History:  Procedure Laterality Date  . BREAST BIOPSY Left 1969   left lumpectomy - benign  . COLONOSCOPY  2012   kaplan-polyps, tics  . LUMBAR LAMINECTOMY/DECOMPRESSION MICRODISCECTOMY  05/07/2012   Procedure: LUMBAR LAMINECTOMY/DECOMPRESSION MICRODISCECTOMY;  Surgeon: Sinclair Ship, MD;  Location: [facility];  Service: Orthopedics;  Laterality: Bilateral;  Lumbar 4-5 decompression  . OVARIAN CYST REMOVAL  1981   right  . TUBAL LIGATION  1981  . VAGINAL HYSTERECTOMY  1992    FHx:    Reviewed / unchanged  SHx:    Reviewed / unchanged   Systems Review:  Constitutional: Denies fever, chills, wt changes, headaches, insomnia, fatigue, night sweats, change in appetite. Eyes: Denies redness, blurred vision, diplopia, discharge, itchy, watery eyes.  ENT: Denies discharge, congestion, post nasal drip,  epistaxis, sore throat, earache, hearing loss, dental pain, tinnitus, vertigo, sinus pain, snoring.  CV: Denies chest pain, palpitations, irregular heartbeat, syncope, dyspnea, diaphoresis, orthopnea, PND, claudication or edema. Respiratory: denies cough, dyspnea, DOE, pleurisy, hoarseness, laryngitis, wheezing.  Gastrointestinal: Denies dysphagia, odynophagia, heartburn, reflux, water brash, abdominal pain or cramps, nausea, vomiting, bloating, diarrhea, constipation, hematemesis, melena, hematochezia  or hemorrhoids. Genitourinary: Denies dysuria, frequency, urgency, nocturia, hesitancy, discharge, hematuria or flank pain. Musculoskeletal: Denies arthralgias, myalgias, stiffness, jt. swelling, pain, limping or strain/sprain.  Skin: Denies pruritus, rash, hives, warts, acne, eczema or change in skin lesion(s). Neuro: No weakness, tremor, incoordination, spasms, paresthesia or pain. Psychiatric: Denies confusion, memory loss or sensory loss. Endo: Denies change in weight, skin or hair change.  Heme/Lymph: No excessive bleeding, bruising or enlarged lymph nodes.  Physical Exam  BP 124/74   Pulse 71   Temp 97.8 F (36.6 C)   Resp 16   Ht 5' 5.5" (1.664 m)   Wt 179 lb 12.8 oz (81.6 kg)   SpO2 99%   BMI 29.47 kg/m   Appears  well nourished, well groomed  and in no distress.  Eyes: PERRLA, EOMs, conjunctiva no swelling or erythema. Sinuses: No frontal/maxillary tenderness ENT/Mouth: EAC's clear, TM's nl w/o erythema, bulging. Nares clear w/o erythema, swelling, exudates. Oropharynx clear without erythema or exudates. Oral hygiene is good. Tongue normal, non obstructing. Hearing intact.  Neck: Supple. Thyroid not palpable. Car 2+/2+ without bruits, nodes or JVD. Chest: Respirations nl with BS clear & equal w/o rales, rhonchi, wheezing or stridor.  Cor: Heart sounds normal w/ regular rate and rhythm without sig. murmurs, gallops, clicks or rubs. Peripheral pulses normal and equal  without  edema.  Abdomen: Soft & bowel sounds normal. Non-tender w/o guarding, rebound, hernias, masses or organomegaly.  Lymphatics: Unremarkable.  Musculoskeletal: Full ROM all peripheral extremities, joint stability, 5/5 strength and normal gait.  Skin: Warm, dry without exposed rashes, lesions or ecchymosis apparent.  Neuro: Cranial nerves intact, reflexes equal bilaterally. Sensory-motor testing grossly intact. Tendon reflexes grossly intact.  Pysch: Alert & oriented x 3.  Insight and judgement nl & appropriate. No ideations.  Assessment and Plan:  1. Essential hypertension  - Continue medication, monitor blood pressure at home.  - Continue DASH diet.  Reminder to go to the ER if any CP,  SOB, nausea, dizziness, severe HA, changes vision/speech.  - CBC with Differential/Platelet - COMPLETE METABOLIC PANEL WITH GFR - Magnesium - TSH  2. Hyperlipidemia associated with type 2 diabetes mellitus (  [place])  - Continue diet/meds, exercise,& lifestyle modifications.  - Continue monitor periodic cholesterol/liver & renal functions   - Lipid panel - TSH  3. Type 2 diabetes mellitus with stage 3a chronic kidney  disease, without long-term current use of insulin ([place]) ------------------------------------------------------------------------ - Patient given new Rx for Glipizide  5 mg & advised    to take 1/2 tab (2.5 mg) 3 x /day with meals ------------------------------------------------------------------------ - Continue diet, exercise  - Lifestyle modifications.  - Monitor appropriate labs.  - Hemoglobin A1c - Insulin, random  4. Vitamin D deficiency  - Continue supplementation.  - VITAMIN D 25 Hydroxy  5. Medication management  - CBC with Differential/Platelet - COMPLETE METABOLIC PANEL WITH GFR - Magnesium - Lipid panel - TSH - Hemoglobin A1c - Insulin, random - VITAMIN D 25 Hydroxy        Discussed  regular exercise, BP monitoring, weight control to achieve/maintain BMI  less than 25 and discussed med and SE's. Recommended labs to assess and monitor clinical status with further disposition pending results of labs.  I discussed the assessment and treatment plan with the patient. The patient was provided an opportunity to ask questions and all were answered. The patient agreed with the plan and demonstrated an understanding of the instructions.  I provided over 30 minutes of exam, counseling, chart review and  complex critical decision making.   Kirtland Bouchard, MD

## 2020-08-15 NOTE — Patient Instructions (Signed)

## 2020-08-16 LAB — CBC WITH DIFFERENTIAL/PLATELET
Absolute Monocytes: 317 cells/uL (ref 200–950)
Basophils Absolute: 42 cells/uL (ref 0–200)
Basophils Relative: 0.8 %
Eosinophils Absolute: 198 cells/uL (ref 15–500)
Eosinophils Relative: 3.8 %
HCT: 41.8 % (ref 35.0–45.0)
Hemoglobin: 14 g/dL (ref 11.7–15.5)
Lymphs Abs: 1934 cells/uL (ref 850–3900)
MCH: 29.5 pg (ref 27.0–33.0)
MCHC: 33.5 g/dL (ref 32.0–36.0)
MCV: 88.2 fL (ref 80.0–100.0)
MPV: 9.8 fL (ref 7.5–12.5)
Monocytes Relative: 6.1 %
Neutro Abs: 2709 cells/uL (ref 1500–7800)
Neutrophils Relative %: 52.1 %
Platelets: 178 10*3/uL (ref 140–400)
RBC: 4.74 10*6/uL (ref 3.80–5.10)
RDW: 12.6 % (ref 11.0–15.0)
Total Lymphocyte: 37.2 %
WBC: 5.2 10*3/uL (ref 3.8–10.8)

## 2020-08-16 LAB — COMPLETE METABOLIC PANEL WITH GFR
AG Ratio: 1.5 (calc) (ref 1.0–2.5)
ALT: 22 U/L (ref 6–29)
AST: 19 U/L (ref 10–35)
Albumin: 4.3 g/dL (ref 3.6–5.1)
Alkaline phosphatase (APISO): 67 U/L (ref 37–153)
BUN/Creatinine Ratio: 14 (calc) (ref 6–22)
BUN: 15 mg/dL (ref 7–25)
CO2: 27 mmol/L (ref 20–32)
Calcium: 9.6 mg/dL (ref 8.6–10.4)
Chloride: 104 mmol/L (ref 98–110)
Creat: 1.1 mg/dL — ABNORMAL HIGH (ref 0.50–0.99)
GFR, Est African American: 59 mL/min/{1.73_m2} — ABNORMAL LOW (ref >=60)
GFR, Est Non African American: 51 mL/min/{1.73_m2} — ABNORMAL LOW (ref >=60)
Globulin: 2.8 g/dL (calc) (ref 1.9–3.7)
Glucose, Bld: 171 mg/dL — ABNORMAL HIGH (ref 65–99)
Potassium: 4.3 mmol/L (ref 3.5–5.3)
Sodium: 140 mmol/L (ref 135–146)
Total Bilirubin: 0.3 mg/dL (ref 0.2–1.2)
Total Protein: 7.1 g/dL (ref 6.1–8.1)

## 2020-08-16 LAB — HEMOGLOBIN A1C
Hgb A1c MFr Bld: 7.1 % of total Hgb — ABNORMAL HIGH (ref <5.7)
Mean Plasma Glucose: 157 (calc)
eAG (mmol/L): 8.7 (calc)

## 2020-08-16 LAB — MAGNESIUM: Magnesium: 2.1 mg/dL (ref 1.5–2.5)

## 2020-08-16 LAB — INSULIN, RANDOM: Insulin: 26.4 u[IU]/mL — ABNORMAL HIGH

## 2020-08-16 LAB — LIPID PANEL
Cholesterol: 140 mg/dL (ref <200)
HDL: 43 mg/dL — ABNORMAL LOW (ref >=50)
LDL Cholesterol (Calc): 78 mg/dL (calc)
Non-HDL Cholesterol (Calc): 97 mg/dL (calc) (ref <130)
Total CHOL/HDL Ratio: 3.3 (calc) (ref <5.0)
Triglycerides: 108 mg/dL (ref <150)

## 2020-08-16 LAB — TSH: TSH: 1.41 mIU/L (ref 0.40–4.50)

## 2020-08-16 LAB — VITAMIN D 25 HYDROXY (VIT D DEFICIENCY, FRACTURES): Vit D, 25-Hydroxy: 66 ng/mL (ref 30–100)

## 2020-08-16 NOTE — Progress Notes (Signed)
========================================================== -   Test results slightly outside the reference range are not unusual. If there is anything important, I will review this with you,  otherwise it is considered normal test values.  If you have further questions,  please do not hesitate to contact me at the office or via My Chart.  ==========================================================  - Glucose 171 mg% - high  ( Ideal would be less than 100)   And A1c = 7.1% -also too high  (Ideal or Goal is less than 5.7%)   - So Suggest you take the Glipizide 5 mg tablet  & take 1/2 tablet 3 x /day with meals as discussed.  ==========================================================  -  Kidney functions - still Stage 3a and Stable.  ==========================================================  -  Total Chol = 140 and LDL Chol = 78 - Both  Excellent   - Very low risk for Heart Attack  / Stroke =========================================================  - Vitamin D = 66 - Great  ==========================================================  -  All Else - CBC - Kidneys - Electrolytes - Liver - Magnesium & Thyroid    - all  Normal / OK ==========================================================   - Keep up the [place] Work  ! ==========================================================

## 2020-08-18 DIAGNOSIS — Z23 Encounter for immunization: Secondary | ICD-10-CM | POA: Diagnosis not present

## 2020-09-18 ENCOUNTER — Other Ambulatory Visit: Payer: Self-pay | Admitting: Neurology

## 2020-09-25 ENCOUNTER — Ambulatory Visit (INDEPENDENT_AMBULATORY_CARE_PROVIDER_SITE_OTHER): Payer: Medicare Other | Admitting: Neurology

## 2020-09-25 ENCOUNTER — Other Ambulatory Visit: Payer: Self-pay

## 2020-09-25 ENCOUNTER — Encounter: Payer: Self-pay | Admitting: Neurology

## 2020-09-25 DIAGNOSIS — G2581 Restless legs syndrome: Secondary | ICD-10-CM | POA: Diagnosis not present

## 2020-09-25 MED ORDER — ROPINIROLE HCL 3 MG PO TABS: ORAL_TABLET | ORAL | 3 refills | Status: DC

## 2020-09-25 MED ORDER — ROPINIROLE HCL 0.5 MG PO TABS
ORAL_TABLET | ORAL | 3 refills | Status: DC
Start: 2020-09-25 — End: 2020-11-06

## 2020-09-25 NOTE — Patient Instructions (Signed)
Return to clinic in 1 year.

## 2020-09-25 NOTE — Progress Notes (Signed)
Follow-up Visit   Date: 09/25/20    Rachel Daniel MRN: [id number] DOB: [birth year]   Interim History: Rachel Daniel is a 69 y.o. right-handed Caucasian female with diabetes mellitus (HbA1c 6.5), hypertension, RLS returning to the clinic for follow-up of restless leg syndrome.  The patient was accompanied to the clinic by daughter.   History of present illness: Starting around her 56s, she recalls having achy pain of the right thigh, which would prohibit her from falling asleep.  Her husband often complained that she would kick him all night long.  She has squeezing, stabbing, knife-like pain of bilateral thighs. The lower legs and feet are spared.  She only has relief with walking and stretching.  Pain was severe at night time, but over the past several years started having daytime symptoms and she cannot get relief with anything. Her pain is better on the weekends when she is less sedentary.  She was diagnosed with RLS in her 7s and started on ropinorole and takes 3mg  at bedtime which provides relief until lunch time.  She is unable to take ropinorole during the day because of sedation.  She is intolerant to Lyrica.  She denies any weakness.    UPDATE 12//13/2021  She is here for 1 year follow-up.  She is doing well on ropinirole 0.5mg  at noon, 0.5mg  at 4pm, and 3mg  at bedtime.  She continues to have some restless sensation in the afternoon, which she manages with walking around.  Symptoms are not bothersome enough to increase the dose of her medication.  Nighttime symptoms are well-controlled and she does not wake up at nighttime.  She usually drinks coffee in the morning and enjoys tea later in the day. No interval falls, hospitalizations, or illnesses.    Medications:  Current Outpatient Medications on File Prior to Visit  Medication Sig Dispense Refill  . acetaminophen (TYLENOL) 325 MG tablet Take 650 mg by mouth every 6 (six) hours as needed.    Marland Kitchen aspirin EC 81 MG tablet Take  81 mg by mouth daily.    Marland Kitchen CRANBERRY EXTRACT PO Take 2 tablets by mouth daily.    . cyclobenzaprine (FLEXERIL) 10 MG tablet Take 1 tablet (10 mg total) by mouth 3 (three) times daily as needed for muscle spasms. Take 1/2-1 tab 90 tablet 1  . ezetimibe (ZETIA) 10 MG tablet Take 1 tablet (10 mg total) by mouth daily. 30 tablet 11  . FIBER ADULT GUMMIES PO Take by mouth daily.    Marland Kitchen glipiZIDE (GLUCOTROL) 5 MG tablet Take     1/2 to 1 tablet      3 x /day      with Meals     for Diabetes 270 tablet 0  . lisinopril (ZESTRIL) 10 MG tablet Take     1 tablet      Daily      for BP 90 tablet 0  . Multiple Vitamins-Minerals (MULTIVITAMIN PO) Take by mouth daily.    Marland Kitchen OVER THE COUNTER MEDICATION Uses OTC eye drops for dry eyes.    . Phenazopyridine HCl (AZO TABS PO) Take 2 tablets by mouth daily.    Marland Kitchen rOPINIRole (REQUIP) 0.5 MG tablet Take 0.5 mg by mouth 2 (two) times daily.    Marland Kitchen rOPINIRole (REQUIP) 3 MG tablet TAKE 1 TABLET BY MOUTH AT BEDTIME FOR  RESTLESS  LEGS 90 tablet 1  . VITAMIN D, CHOLECALCIFEROL, PO Take 6,000 Units by mouth daily.      No  current facility-administered medications on file prior to visit.    Allergies:  Allergies  Allergen Reactions  . Hctz [Hydrochlorothiazide]     Leg cramping  . Metformin And Related Nausea Only  . Phenylbutazones Swelling  . Tropicamide Swelling    Red /itching/watery eyes    Vital Signs:  BP 138/81   Pulse 81   Ht 5\' 5"  (1.651 m)   Wt 183 lb (83 kg)   SpO2 96%   BMI 30.45 kg/m     Neurological Exam: MENTAL STATUS including orientation to time, place, person, and recent and remote memory is normal.  Speech is not dysarthric.  CRANIAL NERVES:  Extraocular muscles intact  MOTOR:  Motor strength is 5/5 throughout. No atrophy, fasciculations or abnormal movements.  No pronator drift.  Tone is normal.    MSRs:  Reflexes are 2+/4, except absent right Achilles  SENSORY:  Vibration intact throughout  COORDINATION/GAIT:  Gait narrow based  and stable.   Data: Lab Results  Component Value Date   FERRITIN 100 10/10/2016   Lab Results  Component Value Date   TSH 1.41 08/15/2020   Lab Results  Component Value Date   [id number] 223 04/06/2015    IMPRESSION/PLAN:   Restless leg syndrome, stable  - Continue ropinirole 0.5mg  at noon, 0.5mg  at 4pm, and 3mg  at bedtime.  Refills provided.  Return to clinic in 1 year   Thank you for allowing me to participate in patient's care.  If I can answer any additional questions, I would be pleased to do so.    Sincerely,    Maxx Pham K. Posey Pronto, DO

## 2020-10-04 ENCOUNTER — Encounter: Payer: Self-pay | Admitting: *Deleted

## 2020-10-26 ENCOUNTER — Other Ambulatory Visit: Payer: Self-pay | Admitting: Internal Medicine

## 2020-11-06 ENCOUNTER — Other Ambulatory Visit: Payer: Self-pay

## 2020-11-06 ENCOUNTER — Telehealth: Payer: Self-pay | Admitting: Neurology

## 2020-11-06 MED ORDER — ROPINIROLE HCL 0.5 MG PO TABS: ORAL_TABLET | ORAL | 0 refills | Status: DC

## 2020-11-06 NOTE — Telephone Encounter (Signed)
Patient called and said she is having trouble getting her ropinirole .5mg  filled. She said the pharmacy denied the refill but she only has 4 pills left. Patient said,"The last prescription may have been sent in too soon and is causing confusion."  [facility] on [address] in [place]

## 2020-11-20 ENCOUNTER — Other Ambulatory Visit: Payer: Self-pay | Admitting: Internal Medicine

## 2020-11-20 DIAGNOSIS — E1165 Type 2 diabetes mellitus with hyperglycemia: Secondary | ICD-10-CM

## 2020-12-05 ENCOUNTER — Other Ambulatory Visit: Payer: Self-pay

## 2020-12-05 ENCOUNTER — Encounter: Payer: Self-pay | Admitting: Adult Health Nurse Practitioner

## 2020-12-05 ENCOUNTER — Ambulatory Visit (INDEPENDENT_AMBULATORY_CARE_PROVIDER_SITE_OTHER): Payer: Medicare Other | Admitting: Adult Health Nurse Practitioner

## 2020-12-05 VITALS — BP 130/80 | HR 63 | Temp 97.0°F | Ht 65.5 in | Wt 184.0 lb

## 2020-12-05 DIAGNOSIS — E785 Hyperlipidemia, unspecified: Secondary | ICD-10-CM | POA: Diagnosis not present

## 2020-12-05 DIAGNOSIS — E119 Type 2 diabetes mellitus without complications: Secondary | ICD-10-CM | POA: Diagnosis not present

## 2020-12-05 DIAGNOSIS — E1169 Type 2 diabetes mellitus with other specified complication: Secondary | ICD-10-CM

## 2020-12-05 DIAGNOSIS — Z0001 Encounter for general adult medical examination with abnormal findings: Secondary | ICD-10-CM | POA: Diagnosis not present

## 2020-12-05 DIAGNOSIS — N182 Chronic kidney disease, stage 2 (mild): Secondary | ICD-10-CM | POA: Diagnosis not present

## 2020-12-05 DIAGNOSIS — E559 Vitamin D deficiency, unspecified: Secondary | ICD-10-CM

## 2020-12-05 DIAGNOSIS — E1122 Type 2 diabetes mellitus with diabetic chronic kidney disease: Secondary | ICD-10-CM | POA: Diagnosis not present

## 2020-12-05 DIAGNOSIS — K2100 Gastro-esophageal reflux disease with esophagitis, without bleeding: Secondary | ICD-10-CM | POA: Diagnosis not present

## 2020-12-05 DIAGNOSIS — G2581 Restless legs syndrome: Secondary | ICD-10-CM

## 2020-12-05 DIAGNOSIS — N1831 Chronic kidney disease, stage 3a: Secondary | ICD-10-CM

## 2020-12-05 DIAGNOSIS — Z6830 Body mass index (BMI) 30.0-30.9, adult: Secondary | ICD-10-CM

## 2020-12-05 DIAGNOSIS — Z79899 Other long term (current) drug therapy: Secondary | ICD-10-CM

## 2020-12-05 DIAGNOSIS — R6889 Other general symptoms and signs: Secondary | ICD-10-CM | POA: Diagnosis not present

## 2020-12-05 DIAGNOSIS — E1142 Type 2 diabetes mellitus with diabetic polyneuropathy: Secondary | ICD-10-CM | POA: Diagnosis not present

## 2020-12-05 DIAGNOSIS — I10 Essential (primary) hypertension: Secondary | ICD-10-CM | POA: Diagnosis not present

## 2020-12-05 DIAGNOSIS — K5730 Diverticulosis of large intestine without perforation or abscess without bleeding: Secondary | ICD-10-CM | POA: Diagnosis not present

## 2020-12-05 NOTE — Progress Notes (Signed)
MEDICARE [place] WELLNESS  Assessment / Plan:   Encounter for medicare annual wellness Yearly  Essential hypertension, benign Continue current medications:  Monitor blood pressure at home; call if consistently over 130/80 Continue DASH diet.   Reminder to go to the ER if any CP, SOB, nausea, dizziness, severe HA, changes vision/speech, left arm numbness and tingling and jaw pain.   Restless leg syndrome Managing at this time Continue requip   Hyperlipidemia associated with DM -adamantly declines statins; risk of MI/CVA discussed- Continue Zetia 10mg  - check lipids, decrease fatty foods, increase activity.  - Lipid panel    Diverticulosis of large intestine without hemorrhage Doing well at this time  increase fiber  Vitamin D deficiency At goal at recent check; continue to recommend supplementation for goal of 60-100 Defer vitamin D level   Medication management Continued   Gastroesophageal reflux disease with esophagitis Continue PPI/H2 blocker, diet discussed  Diabetic autonomic neuropathy associated with type 2 diabetes mellitus Discussed daily foot checks  Controlled type 2 diabetes mellitus with stage 2 chronic kidney disease, without long-term current use of insulin (HCC) Continue medications: Glipizide half tablet breakfast & lunch, whole tablet dinner. Unable to tolerate Discussed general issues about diabetes pathophysiology and management. Education: Reviewed 'ABCs' of diabetes management (respective goals in parentheses):  A1C (<7), blood pressure (<130/80), and cholesterol (LDL <70) Dietary recommendations Encouraged aerobic exercise.  Discussed foot care, check daily Yearly retinal exam Dental exam every 6 months Monitor blood glucose, discussed goal for patient  -     COMPLETE METABOLIC PANEL WITH GFR -     Hemoglobin A1c  CKD stage 2 due to type 2 diabetes mellitus (HCC) Increase fluids  Avoid NSAIDS Blood pressure control Monitor sugars   Will continue to monitor -     COMPLETE METABOLIC PANEL WITH GFR -     Hemoglobin A1c   Overweight (BMI 30.0-30.9)  Overweight  - long discussion about weight loss, diet, and exercise -recommended diet heavy in fruits and veggies and low in animal meats, cheeses, and dairy products   Discussed med's effects and SE's. Screening labs and tests as requested with regular follow-up as recommended.  Further disposition pending results if labs check today. Discussed med's effects and SE's.   Over 30 minutes of face to face interview, exam, counseling, chart review, and critical decision making was performed.    Future Appointments  Date Time Provider [place]  12/20/2020  9:00 AM Bernarda Caffey, MD [facility] None  05/02/2021  2:00 PM Garnet Sierras, NP [facility] None  09/27/2021  9:50 AM Narda Amber K, DO [facility] None  12/05/2021 10:30 AM Garnet Sierras, NP [facility] None    Plan:   During the course of the visit the patient was educated and counseled about appropriate screening and preventive services including:    Pneumococcal vaccine   Prevnar 13  Influenza vaccine  Td vaccine  Screening electrocardiogram  Bone densitometry screening  Colorectal cancer screening  Diabetes screening  Glaucoma screening  Nutrition counseling   Advanced directives: requested  ______________________________________________________________________  HPI 70 y.o. female  presents for a 3 month follow up and Medicare annual wellness for T2DM, HLD, HTN, vitamin D deficiency, CKD II.  She reports that  Overall she is doing well.  NO health or medication concerns today.  She had had bilateral leg swelling for years, left leg always worse than the right. No warmth, no pain, no redness. Improved with farxiga.   She is on requip for RLS which helps,  she follows with Dr. Posey Pronto, on 3 at night and occ 0.25mg  during the day   She retired May 2nd 2018, states she is doing well.    BMI is Body mass index is 30.15 kg/m., she has not been working on diet and exercise. Wt Readings from Last 3 Encounters:  12/05/20 184 lb (83.5 kg)  09/25/20 183 lb (83 kg)  08/15/20 179 lb 12.8 oz (81.6 kg)   She does not check BP at home due to always been steady, today their BP is BP: 130/80 She does not workout. She denies chest pain,  dizziness. She will have some SOB with dragging the trash can up hill in her driveway of 948 ft, no CP with it, no dizziness, nausea or fatigue.   She is not on cholesterol medication, she absolutely declines any medications due to her husband's very poor experience. Her cholesterol is not at goal of LDL <70. The cholesterol last visit was:   Lab Results  Component Value Date   CHOL 140 08/15/2020   HDL 43 (L) 08/15/2020   LDLCALC 78 08/15/2020   TRIG 108 08/15/2020   CHOLHDL 3.3 08/15/2020   She has been working on diet and exercise for diabetes  Dad with CHF, mom and sister with Afib CKD stage III on ACE Neuropathy Hyperlipidemia she is not on cholesterol medication Eye exam 12/2020 no retinopathy Glipizide half tablet with breakfast and lunch and whole tablet with dinner.  could not tolerate the metformin farxiga was too expensive but did help with swelling in her legs denies polydipsia, polyuria and visual disturbances.  She has glucometer  Last A1C in the office was:  Lab Results  Component Value Date   HGBA1C 7.1 (H) 08/15/2020   Lab Results  Component Value Date   GFRNONAA 51 (L) 08/15/2020   Patient is on Vitamin D supplement.   Lab Results  Component Value Date   VD25OH 66 08/15/2020      Current Medications:   Current Outpatient Medications (Endocrine & Metabolic):  .  glipiZIDE (GLUCOTROL) 5 MG tablet, TAKE 1/2 TO 1 TABLET BY MOUTH THREE TIMES DAILY WITH MEALS FOR DIABETES  Current Outpatient Medications (Cardiovascular):  .  ezetimibe (ZETIA) 10 MG tablet, Take 1 tablet (10 mg total) by mouth daily. Marland Kitchen   lisinopril (ZESTRIL) 10 MG tablet, TAKE 1 TABLET BY MOUTH DAILY FOR BLOOD PRESSURE   Current Outpatient Medications (Analgesics):  .  acetaminophen (TYLENOL) 325 MG tablet, Take 650 mg by mouth every 6 (six) hours as needed. Marland Kitchen  aspirin EC 81 MG tablet, Take 81 mg by mouth daily.   Current Outpatient Medications (Other):  Marland Kitchen  CRANBERRY EXTRACT PO, Take 2 tablets by mouth daily. .  cyclobenzaprine (FLEXERIL) 10 MG tablet, Take 1 tablet (10 mg total) by mouth 3 (three) times daily as needed for muscle spasms. Take 1/2-1 tab .  FIBER ADULT GUMMIES PO, Take by mouth daily. .  Multiple Vitamins-Minerals (MULTIVITAMIN PO), Take by mouth daily. Marland Kitchen  OVER THE COUNTER MEDICATION, Uses OTC eye drops for dry eyes. .  Phenazopyridine HCl (AZO TABS PO), Take 2 tablets by mouth daily. Marland Kitchen  rOPINIRole (REQUIP) 0.5 MG tablet, Take 1 tablet at noon and 4pm .  rOPINIRole (REQUIP) 3 MG tablet, TAKE 1 TABLET BY MOUTH AT BEDTIME FOR  RESTLESS  LEGS .  VITAMIN D, CHOLECALCIFEROL, PO, Take 6,000 Units by mouth daily.    MEDICARE WELLNESS OBJECTIVES: Physical activity: Current Exercise Habits: The patient does not participate in regular  exercise at present Cardiac risk factors: Cardiac Risk Factors include: advanced age (>38men, >13 women);dyslipidemia;diabetes mellitus;sedentary lifestyle;hypertension Depression/mood screen:   Depression screen [facility] 2/9 12/05/2020  Decreased Interest 0  Down, Depressed, Hopeless 0  PHQ - 2 Score 0    ADLs:  In your present state of health, do you have any difficulty performing the following activities: 12/05/2020 08/15/2020  Hearing? N N  Vision? N N  Difficulty concentrating or making decisions? N N  Walking or climbing stairs? N N  Dressing or bathing? N N  Doing errands, shopping? N N  Preparing Food and eating ? N -  Using the Toilet? N -  In the past six months, have you accidently leaked urine? N -  Do you have problems with loss of bowel control? N -  Managing your  Medications? N -  Managing your Finances? N -  Housekeeping or managing your Housekeeping? N -  Some recent data might be hidden     Cognitive Testing  Alert? Yes  Normal Appearance?Yes  Oriented to person? Yes  Place? Yes   Time? Yes  Recall of three objects?  Yes  Can perform simple calculations? Yes  Displays appropriate judgment?Yes  Can read the correct time from a watch face?Yes  EOL planning: Does Patient Have a Medical Advance Directive?: Yes Type of Advance Directive: [place] will     Health Maintenance:   Immunization History  Administered Date(s) Administered  . Influenza, High Dose Seasonal PF 08/01/2017, 09/22/2018, 07/20/2019  . Moderna Sars-Covid-2 Vaccination 01/18/2020, 02/15/2020, 08/18/2020  . Pneumococcal Conjugate-13 04/05/2016  . Pneumococcal Polysaccharide-23 04/15/2017  . Tdap 03/07/2011   Health Maintenance  Topic Date Due  . INFLUENZA VACCINE  05/14/2020  . FOOT EXAM  07/19/2020  . OPHTHALMOLOGY EXAM  12/12/2020  . HEMOGLOBIN A1C  02/12/2021  . TETANUS/TDAP  07/03/2021  . MAMMOGRAM  08/10/2022  . COLONOSCOPY (Pts 45-76yrs Insurance coverage will need to be confirmed)  07/12/2026  . DEXA SCAN  Completed  . COVID-19 Vaccine  Completed  . Hepatitis C Screening  Completed  . PNA vac Low Risk Adult  Completed   Tetanus: 2012 Pneumovax: 2018 Prevnar 13: 2017 Flu vaccine: 2019  Zostavax: declines  Pap: 2016 negative MGM: 07/2020  DEXA: 2021 normal, declines for now Colonoscopy: 06/2016, Dr. Deatra Ina EGD: N/A MRI lumbar spine 2013 CXR 2017  Eye exam: Dr. Rodman Key 12/2019  Dentist: Dr. Harrington Challenger q 6 months 2020  Patient Care Team: Unk Pinto, MD as PCP - General (Internal Medicine) Unk Pinto, MD as PCP - Internal Medicine (Internal Medicine) Phylliss Bob, MD as Consulting Physician (Orthopedic Surgery) Alda Berthold, DO as Consulting Physician (Neurology)   Allergies:  Allergies  Allergen  Reactions  . Hctz [Hydrochlorothiazide]     Leg cramping  . Metformin And Related Nausea Only  . Phenylbutazones Swelling  . Tropicamide Swelling    Red /itching/watery eyes   Medical History:  Past Medical History:  Diagnosis Date  . Allergy    otc med prn  . Arthritis    hips, lower back  . Cataract    bilateral  . Chronic kidney disease    stage 3 per patient   . Diet-controlled type 2 diabetes mellitus (HCC)    Diet controlled, no meds  . Diverticulosis   . Dribbling urine   . EKG abnormalities    pt states she had a BBB on her last EKG, no problems  . Essential hypertension, benign 08/19/2013  . HPV  in female   . Hyperlipidemia    diet controlled, no med  . Restless leg syndrome   . Snoring   . Vitamin D deficiency    Surgical History:  She  has a past surgical history that includes Vaginal hysterectomy (1992); Tubal ligation (1981); Ovarian cyst removal (1981); Lumbar laminectomy/decompression microdiscectomy (05/07/2012); Colonoscopy (2012); and Breast biopsy (Left, 1969).  Family History:  Her family history includes Atrial fibrillation in her brother, brother, sister, and sister; Cataracts in her mother; Colon cancer (age of onset: 76) in her mother; Colon polyps in her brother and sister; Diabetes in her mother, sister, and sister; Heart failure in her mother; Heart failure (age of onset: 36) in her father; Hypertension in her mother; Stroke in her mother; Stroke (age of onset: 33) in her sister.   Social History:  She  reports that she is a non-smoker but has been exposed to tobacco smoke. She has never used smokeless tobacco. She reports that she does not drink alcohol and does not use drugs.   Review of Systems  Constitutional: Negative.   HENT: Negative.   Eyes: Negative.  Negative for blurred vision.  Respiratory: Negative for cough, hemoptysis, sputum production, shortness of breath and wheezing.   Cardiovascular: Negative.  Negative for chest pain.   Gastrointestinal: Negative.   Genitourinary: Negative.        Stress incontinence  Musculoskeletal: Negative for back pain, falls, joint pain, myalgias (bilateral legs) and neck pain.  Skin: Negative for itching and rash.  Neurological: Negative for dizziness, tingling, tremors, sensory change and headaches.  Psychiatric/Behavioral: Negative.    Physical Exam: Estimated body mass index is 30.15 kg/m as calculated from the following:   Height as of this encounter: 5' 5.5" (1.664 m).   Weight as of this encounter: 184 lb (83.5 kg). BP 130/80   Pulse 63   Temp (!) 97 F (36.1 C)   Ht 5' 5.5" (1.664 m)   Wt 184 lb (83.5 kg)   SpO2 96%   BMI 30.15 kg/m    General Appearance: Well nourished, in no apparent distress. Eyes: PERRLA, EOMs, conjunctiva no swelling or erythema, normal fundi and vessels. Sinuses: No Frontal/maxillary tenderness ENT/Mouth: Ext aud canals clear, normal light reflex with TMs without erythema, bulging.  Good dentition. No erythema, swelling, or exudate on post pharynx. Tonsils not swollen or erythematous. Hearing normal.  Neck: Supple, thyroid normal. No bruits Respiratory: Respiratory effort normal, BS equal bilaterally without rales, rhonchi, wheezing or stridor. Cardio: RRR without murmurs, rubs or gallops. Brisk peripheral pulses without edema.  Chest: symmetric, with normal excursions and percussion. Breasts: Symmetric, without lumps, nipple discharge, retractions. Abdomen: Soft, +BS, Non tender, no guarding, rebound, hernias, masses, or organomegaly. .  Lymphatics: Non tender without lymphadenopathy.  Musculoskeletal: Full ROM all peripheral extremities,5/5 strength, and normal gait.  Skin: + varicose veins bilateral legs. Warm, dry without rashes, lesions, ecchymosis.  Neuro: Cranial nerves intact, reflexes equal bilaterally. Normal muscle tone, no cerebellar symptoms. Sensation intact Psych: Awake and oriented X 3, normal affect, Insight and Judgment  appropriate.    Garnet Sierras, Laqueta Jean, DNP [facility] 12/05/2020  11:35 AM

## 2020-12-06 LAB — LIPID PANEL
Cholesterol: 136 mg/dL (ref <200)
HDL: 41 mg/dL — ABNORMAL LOW (ref >=50)
LDL Cholesterol (Calc): 74 mg/dL (calc)
Non-HDL Cholesterol (Calc): 95 mg/dL (calc) (ref <130)
Total CHOL/HDL Ratio: 3.3 (calc) (ref <5.0)
Triglycerides: 133 mg/dL (ref <150)

## 2020-12-06 LAB — CBC WITH DIFFERENTIAL/PLATELET
Absolute Monocytes: 378 cells/uL (ref 200–950)
Basophils Absolute: 50 cells/uL (ref 0–200)
Basophils Relative: 0.8 %
Eosinophils Absolute: 136 cells/uL (ref 15–500)
Eosinophils Relative: 2.2 %
HCT: 44.1 % (ref 35.0–45.0)
Hemoglobin: 14.9 g/dL (ref 11.7–15.5)
Lymphs Abs: 1928 cells/uL (ref 850–3900)
MCH: 29.9 pg (ref 27.0–33.0)
MCHC: 33.8 g/dL (ref 32.0–36.0)
MCV: 88.6 fL (ref 80.0–100.0)
MPV: 9.3 fL (ref 7.5–12.5)
Monocytes Relative: 6.1 %
Neutro Abs: 3708 cells/uL (ref 1500–7800)
Neutrophils Relative %: 59.8 %
Platelets: 209 10*3/uL (ref 140–400)
RBC: 4.98 10*6/uL (ref 3.80–5.10)
RDW: 12.6 % (ref 11.0–15.0)
Total Lymphocyte: 31.1 %
WBC: 6.2 10*3/uL (ref 3.8–10.8)

## 2020-12-06 LAB — COMPLETE METABOLIC PANEL WITH GFR
AG Ratio: 1.3 (calc) (ref 1.0–2.5)
ALT: 23 U/L (ref 6–29)
AST: 20 U/L (ref 10–35)
Albumin: 4.3 g/dL (ref 3.6–5.1)
Alkaline phosphatase (APISO): 80 U/L (ref 37–153)
BUN/Creatinine Ratio: 13 (calc) (ref 6–22)
BUN: 13 mg/dL (ref 7–25)
CO2: 28 mmol/L (ref 20–32)
Calcium: 9.7 mg/dL (ref 8.6–10.4)
Chloride: 103 mmol/L (ref 98–110)
Creat: 1.04 mg/dL — ABNORMAL HIGH (ref 0.60–0.93)
GFR, Est African American: 63 mL/min/{1.73_m2} (ref >=60)
GFR, Est Non African American: 54 mL/min/{1.73_m2} — ABNORMAL LOW (ref >=60)
Globulin: 3.2 g/dL (calc) (ref 1.9–3.7)
Glucose, Bld: 107 mg/dL — ABNORMAL HIGH (ref 65–99)
Potassium: 4.2 mmol/L (ref 3.5–5.3)
Sodium: 138 mmol/L (ref 135–146)
Total Bilirubin: 0.4 mg/dL (ref 0.2–1.2)
Total Protein: 7.5 g/dL (ref 6.1–8.1)

## 2020-12-06 LAB — HEMOGLOBIN A1C
Hgb A1c MFr Bld: 7.3 % of total Hgb — ABNORMAL HIGH (ref <5.7)
Mean Plasma Glucose: 163 mg/dL
eAG (mmol/L): 9 mmol/L

## 2020-12-18 NOTE — Progress Notes (Addendum)
[facility] Note  12/20/2020     CHIEF COMPLAINT Patient presents for Retina Follow Up   HISTORY OF PRESENT ILLNESS: Rachel Daniel is a 70 y.o. female who presents to the clinic today for:   HPI    Retina Follow Up    Patient presents with  Diabetic Retinopathy.  In both eyes.  This started weeks ago.  Severity is moderate.  Duration of weeks.  Since onset it is gradually worsening.  I, the attending physician,  performed the HPI with the patient and updated documentation appropriately.          Comments    BS: ??? A1c: 7.3 Pt states her vision intermittently gets worse.  Patient uses tears on occasion but usually only if she feels like she needs it.  Pt denies eye pain.  Pt denies new or worsening floaters or fol OU.        Last edited by Bernarda Caffey, MD on 12/20/2020  9:52 AM. (History)    Pt states her last A1c was 7.3 on 02.22.22, she states her dr seems happy with this number and is not not changing any of her medications, pt is complaining of intermittent blurriness today  Referring physician: Liane Comber, NP [address] [place],  [place] [id number]  HISTORICAL INFORMATION:   Selected notes from the [facility] Referred by A. Corbett, NP (internal med) for DM exam;   LEE- unknown - saw Dr. Zigmund Daniel on 08.07.17, BCVA at that time OD: 20/20 OS: 20/20 Ocular Hx- cataract OU PMH- DM, arthritis, CKD, HTN    CURRENT MEDICATIONS: No current outpatient medications on file. (Ophthalmic Drugs)   No current facility-administered medications for this visit. (Ophthalmic Drugs)   Current Outpatient Medications (Other)  Medication Sig  . acetaminophen (TYLENOL) 325 MG tablet Take 650 mg by mouth every 6 (six) hours as needed.  Marland Kitchen aspirin EC 81 MG tablet Take 81 mg by mouth daily.  Marland Kitchen CRANBERRY EXTRACT PO Take 2 tablets by mouth daily.  . cyclobenzaprine (FLEXERIL) 10 MG tablet Take 1 tablet (10 mg total) by mouth 3  (three) times daily as needed for muscle spasms. Take 1/2-1 tab  . ezetimibe (ZETIA) 10 MG tablet Take 1 tablet (10 mg total) by mouth daily.  Marland Kitchen FIBER ADULT GUMMIES PO Take by mouth daily.  Marland Kitchen glipiZIDE (GLUCOTROL) 5 MG tablet TAKE 1/2 TO 1 TABLET BY MOUTH THREE TIMES DAILY WITH MEALS FOR DIABETES  . lisinopril (ZESTRIL) 10 MG tablet TAKE 1 TABLET BY MOUTH DAILY FOR BLOOD PRESSURE  . Multiple Vitamins-Minerals (MULTIVITAMIN PO) Take by mouth daily.  Marland Kitchen OVER THE COUNTER MEDICATION Uses OTC eye drops for dry eyes.  . Phenazopyridine HCl (AZO TABS PO) Take 2 tablets by mouth daily.  Marland Kitchen rOPINIRole (REQUIP) 0.5 MG tablet Take 1 tablet at noon and 4pm  . rOPINIRole (REQUIP) 3 MG tablet TAKE 1 TABLET BY MOUTH AT BEDTIME FOR  RESTLESS  LEGS  . VITAMIN D, CHOLECALCIFEROL, PO Take 6,000 Units by mouth daily.    No current facility-administered medications for this visit. (Other)      REVIEW OF SYSTEMS: ROS    Positive for: Endocrine, Eyes   Negative for: Constitutional, Gastrointestinal, Neurological, Skin, Genitourinary, Musculoskeletal, HENT, Cardiovascular, Respiratory, Psychiatric, Allergic/Imm, Heme/Lymph   Last edited by Doneen Poisson on 12/20/2020  9:09 AM. (History)       ALLERGIES Allergies  Allergen Reactions  . Hctz [Hydrochlorothiazide]  Leg cramping  . Metformin And Related Nausea Only  . Phenylbutazones Swelling  . Tropicamide Swelling    Red /itching/watery eyes    PAST MEDICAL HISTORY Past Medical History:  Diagnosis Date  . Allergy    otc med prn  . Arthritis    hips, lower back  . Cataract    bilateral  . Chronic kidney disease    stage 3 per patient   . Diet-controlled type 2 diabetes mellitus (HCC)    Diet controlled, no meds  . Diverticulosis   . Dribbling urine   . EKG abnormalities    pt states she had a BBB on her last EKG, no problems  . Essential hypertension, benign 08/19/2013  . HPV in female   . Hyperlipidemia    diet controlled, no med  .  Restless leg syndrome   . Snoring   . Vitamin D deficiency    Past Surgical History:  Procedure Laterality Date  . BREAST BIOPSY Left 1969   left lumpectomy - benign  . COLONOSCOPY  2012   kaplan-polyps, tics  . LUMBAR LAMINECTOMY/DECOMPRESSION MICRODISCECTOMY  05/07/2012   Procedure: LUMBAR LAMINECTOMY/DECOMPRESSION MICRODISCECTOMY;  Surgeon: Sinclair Ship, MD;  Location: [facility];  Service: Orthopedics;  Laterality: Bilateral;  Lumbar 4-5 decompression  . OVARIAN CYST REMOVAL  1981   right  . TUBAL LIGATION  1981  . VAGINAL HYSTERECTOMY  1992    FAMILY HISTORY Family History  Problem Relation Age of Onset  . Hypertension Mother   . Diabetes Mother   . Stroke Mother   . Heart failure Mother   . Colon cancer Mother 44  . Cataracts Mother   . Heart failure Father 44  . Stroke Sister 51  . Colon polyps Sister   . Atrial fibrillation Sister   . Diabetes Sister   . Colon polyps Brother   . Atrial fibrillation Brother   . Atrial fibrillation Brother   . Atrial fibrillation Sister   . Diabetes Sister   . Stomach cancer Neg Hx   . Esophageal cancer Neg Hx   . Rectal cancer Neg Hx   . Amblyopia Neg Hx   . Blindness Neg Hx   . Glaucoma Neg Hx   . Macular degeneration Neg Hx   . Retinal detachment Neg Hx   . Strabismus Neg Hx   . Retinitis pigmentosa Neg Hx   . Breast cancer Neg Hx     SOCIAL HISTORY Social History   Tobacco Use  . Smoking status: Passive Smoke Exposure - Never Smoker  . Smokeless tobacco: Never Used  Vaping Use  . Vaping Use: Never used  Substance Use Topics  . Alcohol use: No    Alcohol/week: 0.0 standard drinks  . Drug use: No         OPHTHALMIC EXAM:  Base Eye Exam    Visual Acuity (Snellen - Linear)      Right Left   Dist cc 20/20 -1 20/25   Correction: Glasses       Tonometry (Tonopen, 9:12 AM)      Right Left   Pressure 14 13       Pupils      Dark Light Shape React APD   Right 3 2 Round Minimal 0   Left 3 2 Round  Minimal 0       Visual Fields      Left Right    Full Full       Extraocular Movement  Right Left    Full Full       Neuro/Psych    Oriented x3: Yes   Mood/Affect: Normal       Dilation    Both eyes: 2.5% Phenylephrine @ 9:12 AM        Slit Lamp and Fundus Exam    Slit Lamp Exam      Right Left   Lids/Lashes Dermatochalasis - upper lid, Meibomian gland dysfunction Dermatochalasis - upper lid, Meibomian gland dysfunction   Conjunctiva/Sclera White and quiet, Conjunctivochalasis White and quiet   Cornea Arcus, tear film debris, trace PEE Arcus, other wise clear   Anterior Chamber Deep, Temporal Narrow angle Deep, Temporal Narrow angle   Iris Round and dilated, No NVI Round and dilated, No NVI   Lens 2-3+ Nuclear sclerosis, 2-3+ Cortical cataract 2-3+ Nuclear sclerosis, 3 Cortical cataract   Vitreous Vitreous syneresis Vitreous syneresis, Posterior vitreous detachment       Fundus Exam      Right Left   Disc Pink and sharp.  Mild Peripapillary pigmentation and atrophy, No NVD 360 Peripapillary atrophy with pigmnetation, Pink and Sharp   C/D Ratio 0.5 0.2   Macula Flat, good foveal reflex, Retinal pigment epithelial mottling, No heme or edema Flat, Blunted foveal reflex, RPE mottling and clumping, No heme or edema   Vessels mild attenuation, mild tortuousity mild attenuation, mild tortuousity   Periphery Attached.  No heme. Attached.  No heme.        Refraction    Wearing Rx      Sphere Cylinder Axis   Right -2.25 +0.50 018   Left -2.00 +0.50 067          IMAGING AND PROCEDURES  Imaging and Procedures for 11/27/17  OCT, Retina - OU - Both Eyes       Right Eye Quality was good. Central Foveal Thickness: 225. Progression has been stable. Findings include normal foveal contour, no IRF, no SRF.   Left Eye Quality was good. Central Foveal Thickness: 232. Progression has been stable. Findings include normal foveal contour, no IRF, no SRF (Peripapillary  ORA).   Notes *Images captured and stored on drive  Diagnosis / Impression: NFP, No IRF/SRF No DME OU OS: Peripapillary ORA  Clinical management:  See below  Abbreviations: NFP - Normal foveal profile. CME - cystoid macular edema. PED - pigment epithelial detachment. IRF - intraretinal fluid. SRF - subretinal fluid. EZ - ellipsoid zone. ERM - epiretinal membrane. ORA - outer retinal atrophy. ORT - outer retinal tubulation. SRHM - subretinal hyper-reflective material                  ASSESSMENT/PLAN:    ICD-10-CM   1. Diabetes mellitus type 2 without retinopathy ([place])  E11.9   2. Posterior vitreous detachment of left eye  H43.812   3. Retinal edema  H35.81 OCT, Retina - OU - Both Eyes  4. Combined forms of age-related cataract of both eyes  H25.813   5. Dry eyes  H04.123   6. Meibomian gland dysfunction (MGD) of both eyes  H02.883    H02.886     1. Diabetes mellitus, type 2 without retinopathy -- stable  - A1c:7.3 on 02.22.22  - The incidence, risk factors for progression, natural history and treatment options for diabetic retinopathy  were discussed with patient.    - The need for close monitoring of blood glucose, blood pressure, and serum lipids, avoiding cigarette or any type of tobacco, and the need for long  term follow up was also discussed with patient.  - f/u in 1-2 years, sooner prn  2. PVD OS  Long-standing, floater OS -- unchanged  Discussed findings and prognosis  No RT or RD on 360 peripheral exam  Reviewed s/s of RT/RD  Strict return precautions for any such RT/RD symptoms  3. No retinal edema on exam or OCT  4. Combined form age-related cataract OU   - The symptoms of cataract, surgical options, and treatments and risks were discussed with patient.  - discussed diagnosis and progression  - progressing but not yet visually significant  - monitor  5,6. Dry Eyes / MGD OU  - recommend artificial tears and lubricating ointment as needed  -  recommend hot compresses and lid scrubs    Ophthalmic Meds Ordered this visit:  No orders of the defined types were placed in this encounter.      Return in about 1 year (around 12/20/2021) for f/u DM exam, DFE, OCT.  There are no Patient Instructions on file for this visit.   Explained the diagnoses, plan, and follow up with the patient and they expressed understanding.  Patient expressed understanding of the importance of proper follow up care.   This document serves as a record of services personally performed by Gardiner Sleeper, MD, PhD. It was created on their behalf by Roselee Nova, COMT. The creation of this record is the provider's dictation and/or activities during the visit.  Electronically signed by: Roselee Nova, COMT 12/20/20 9:55 AM  Gardiner Sleeper, M.D., Ph.D. Diseases & Surgery of the Retina and Vitreous [facility]  I have reviewed the above documentation for accuracy and completeness, and I agree with the above. Gardiner Sleeper, M.D., Ph.D. 12/20/20 9:55 AM  Abbreviations: M myopia (nearsighted); A astigmatism; H hyperopia (farsighted); P presbyopia; Mrx spectacle prescription;  CTL contact lenses; OD right eye; OS left eye; OU both eyes  XT exotropia; ET esotropia; PEK punctate epithelial keratitis; PEE punctate epithelial erosions; DES dry eye syndrome; MGD meibomian gland dysfunction; ATs artificial tears; PFAT's preservative free artificial tears; [place] nuclear sclerotic cataract; PSC posterior subcapsular cataract; ERM epi-retinal membrane; PVD posterior vitreous detachment; RD retinal detachment; DM diabetes mellitus; DR diabetic retinopathy; NPDR non-proliferative diabetic retinopathy; PDR proliferative diabetic retinopathy; CSME clinically significant macular edema; DME diabetic macular edema; dbh dot blot hemorrhages; CWS cotton wool spot; POAG primary open angle glaucoma; C/D cup-to-disc ratio; HVF humphrey visual field; GVF goldmann visual  field; OCT optical coherence tomography; IOP intraocular pressure; BRVO Branch retinal vein occlusion; CRVO central retinal vein occlusion; CRAO central retinal artery occlusion; BRAO branch retinal artery occlusion; RT retinal tear; SB scleral buckle; PPV pars plana vitrectomy; VH Vitreous hemorrhage; PRP panretinal laser photocoagulation; IVK intravitreal kenalog; VMT vitreomacular traction; MH Macular hole;  NVD neovascularization of the disc; NVE neovascularization elsewhere; AREDS age related eye disease study; ARMD age related macular degeneration; POAG primary open angle glaucoma; EBMD epithelial/anterior basement membrane dystrophy; ACIOL anterior chamber intraocular lens; IOL intraocular lens; PCIOL posterior chamber intraocular lens; Phaco/IOL phacoemulsification with intraocular lens placement; [place] photorefractive keratectomy; LASIK laser assisted in situ keratomileusis; HTN hypertension; DM diabetes mellitus; COPD chronic obstructive pulmonary disease

## 2020-12-20 ENCOUNTER — Encounter (INDEPENDENT_AMBULATORY_CARE_PROVIDER_SITE_OTHER): Payer: Self-pay | Admitting: Ophthalmology

## 2020-12-20 ENCOUNTER — Other Ambulatory Visit: Payer: Self-pay

## 2020-12-20 ENCOUNTER — Ambulatory Visit (INDEPENDENT_AMBULATORY_CARE_PROVIDER_SITE_OTHER): Payer: Medicare Other | Admitting: Ophthalmology

## 2020-12-20 DIAGNOSIS — H04123 Dry eye syndrome of bilateral lacrimal glands: Secondary | ICD-10-CM | POA: Diagnosis not present

## 2020-12-20 DIAGNOSIS — H3581 Retinal edema: Secondary | ICD-10-CM | POA: Diagnosis not present

## 2020-12-20 DIAGNOSIS — H43812 Vitreous degeneration, left eye: Secondary | ICD-10-CM

## 2020-12-20 DIAGNOSIS — E119 Type 2 diabetes mellitus without complications: Secondary | ICD-10-CM

## 2020-12-20 DIAGNOSIS — H02883 Meibomian gland dysfunction of right eye, unspecified eyelid: Secondary | ICD-10-CM | POA: Diagnosis not present

## 2020-12-20 DIAGNOSIS — H25813 Combined forms of age-related cataract, bilateral: Secondary | ICD-10-CM | POA: Diagnosis not present

## 2020-12-20 DIAGNOSIS — H02886 Meibomian gland dysfunction of left eye, unspecified eyelid: Secondary | ICD-10-CM

## 2020-12-20 LAB — HM DIABETES EYE EXAM

## 2021-02-17 ENCOUNTER — Other Ambulatory Visit: Payer: Self-pay | Admitting: Adult Health

## 2021-05-02 ENCOUNTER — Encounter: Payer: Self-pay | Admitting: Adult Health

## 2021-05-02 ENCOUNTER — Ambulatory Visit (INDEPENDENT_AMBULATORY_CARE_PROVIDER_SITE_OTHER): Payer: Medicare Other | Admitting: Adult Health

## 2021-05-02 ENCOUNTER — Other Ambulatory Visit: Payer: Self-pay

## 2021-05-02 VITALS — BP 132/76 | HR 79 | Temp 97.2°F | Ht 64.5 in | Wt 185.2 lb

## 2021-05-02 DIAGNOSIS — E785 Hyperlipidemia, unspecified: Secondary | ICD-10-CM

## 2021-05-02 DIAGNOSIS — E1142 Type 2 diabetes mellitus with diabetic polyneuropathy: Secondary | ICD-10-CM | POA: Diagnosis not present

## 2021-05-02 DIAGNOSIS — Z1329 Encounter for screening for other suspected endocrine disorder: Secondary | ICD-10-CM

## 2021-05-02 DIAGNOSIS — G2581 Restless legs syndrome: Secondary | ICD-10-CM

## 2021-05-02 DIAGNOSIS — Z5329 Procedure and treatment not carried out because of patient's decision for other reasons: Secondary | ICD-10-CM

## 2021-05-02 DIAGNOSIS — Z0000 Encounter for general adult medical examination without abnormal findings: Secondary | ICD-10-CM | POA: Diagnosis not present

## 2021-05-02 DIAGNOSIS — E1169 Type 2 diabetes mellitus with other specified complication: Secondary | ICD-10-CM

## 2021-05-02 DIAGNOSIS — N183 Chronic kidney disease, stage 3 unspecified: Secondary | ICD-10-CM | POA: Insufficient documentation

## 2021-05-02 DIAGNOSIS — Z136 Encounter for screening for cardiovascular disorders: Secondary | ICD-10-CM | POA: Diagnosis not present

## 2021-05-02 DIAGNOSIS — I10 Essential (primary) hypertension: Secondary | ICD-10-CM | POA: Diagnosis not present

## 2021-05-02 DIAGNOSIS — M8580 Other specified disorders of bone density and structure, unspecified site: Secondary | ICD-10-CM

## 2021-05-02 DIAGNOSIS — N1831 Chronic kidney disease, stage 3a: Secondary | ICD-10-CM | POA: Diagnosis not present

## 2021-05-02 DIAGNOSIS — Z79899 Other long term (current) drug therapy: Secondary | ICD-10-CM | POA: Diagnosis not present

## 2021-05-02 DIAGNOSIS — Z1389 Encounter for screening for other disorder: Secondary | ICD-10-CM

## 2021-05-02 DIAGNOSIS — E669 Obesity, unspecified: Secondary | ICD-10-CM

## 2021-05-02 DIAGNOSIS — E1122 Type 2 diabetes mellitus with diabetic chronic kidney disease: Secondary | ICD-10-CM | POA: Diagnosis not present

## 2021-05-02 DIAGNOSIS — Z5320 Procedure and treatment not carried out because of patient's decision for unspecified reasons: Secondary | ICD-10-CM

## 2021-05-02 DIAGNOSIS — E559 Vitamin D deficiency, unspecified: Secondary | ICD-10-CM | POA: Diagnosis not present

## 2021-05-02 NOTE — Patient Instructions (Signed)
  Rachel Daniel , Thank you for taking time to come for your Medicare Wellness Visit. I appreciate your ongoing commitment to your health goals. Please review the following plan we discussed and let me know if I can assist you in the future.   These are the goals we discussed:  Goals      Exercise 150 min/wk Moderate Activity     Weight (lb) < 165 lb (74.8 kg)        This is a list of the screening recommended for you and due dates:  Health Maintenance  Topic Date Due   Eye exam for diabetics  12/12/2020   Zoster (Shingles) Vaccine (1 of 2) 05/02/2022*   Flu Shot  05/14/2021   COVID-19 Vaccine (5 - Booster for Moderna series) 05/14/2021   Hemoglobin A1C  06/04/2021   Tetanus Vaccine  07/03/2021   Complete foot exam   05/02/2022   Mammogram  08/10/2022   Colon Cancer Screening  07/12/2026   DEXA scan (bone density measurement)  Completed   Hepatitis C Screening: USPSTF Recommendation to screen - Ages 28-79 yo.  Completed   Pneumonia vaccines  Completed   HPV Vaccine  Aged Out  *Topic was postponed. The date shown is not the original due date.      Know what a healthy weight is for you (roughly BMI <25) and aim to maintain this  Aim for 7+ servings of fruits and vegetables daily  65-80+ fluid ounces of water or unsweet tea for healthy kidneys  Limit to max 1 drink of alcohol per day; avoid smoking/tobacco  Limit animal fats in diet for cholesterol and heart health - choose grass fed whenever available  Avoid highly processed foods, and foods high in saturated/trans fats  Aim for low stress - take time to unwind and care for your mental health  Aim for 150 min of moderate intensity exercise weekly for heart health, and weights twice weekly for bone health  Aim for 7-9 hours of sleep daily    A great goal to work towards is aiming to get in a serving daily of some of the most nutritionally dense foods - G- BOMBS daily

## 2021-05-02 NOTE — Progress Notes (Signed)
CPE  Assessment / Plan:   Encounter for Annual Physical Exam with abnormal findings Due annually  Health Maintenance reviewed Healthy lifestyle reviewed and goals set  Essential hypertension, benign Continue current medications, labile  Monitor blood pressure at home; call if consistently over 130/80 Continue DASH diet.   Reminder to go to the ER if any CP, SOB, nausea, dizziness, severe HA, changes vision/speech, left arm numbness and tingling and jaw pain.  Restless leg syndrome Managing at this time Continue requip   Hyperlipidemia associated with DM -adamantly declines statins;  Continue Zetia 10mg , is close to LDL goal <70 - check lipids, decrease fatty foods, increase activity.  - Lipid panel   Diverticulosis of large intestine without hemorrhage Doing well at this time  increase fiber  Vitamin D deficiency At goal at recent check; continue to recommend supplementation for goal of 60-100 Defer vitamin D level to next OV to avoid bill    Medication management Continued   Gastroesophageal reflux disease with esophagitis Continue PPI/H2 blocker, diet discussed  Diabetic poly neuropathy associated with type 2 diabetes mellitus Check feet regularly; control sugars  Controlled type 2 diabetes mellitus with stage 2 chronic kidney disease, without long-term current use of insulin (HCC) Continue medications: Glipizide 5 mg TID Unable to tolerate metformin  Discussed general issues about diabetes pathophysiology and management. Education: Reviewed 'ABCs' of diabetes management (respective goals in parentheses):  A1C (<7), blood pressure (<130/80), and cholesterol (LDL <70) Dietary recommendations Encouraged aerobic exercise.  Discussed foot care, check daily Yearly retinal exam Dental exam every 6 months Monitor blood glucose, discussed goal for patient -     COMPLETE METABOLIC PANEL WITH GFR -     Hemoglobin A1c  CKD stage 2 due to type 2 diabetes mellitus  (HCC) Increase fluids, avoid NSAIDS, monitor sugars, will monitor -     COMPLETE METABOLIC PANEL WITH GFR -     UA/Micro  Overweight (BMI 30-34) - long discussion about weight loss, diet, and exercise -recommended diet heavy in fruits and veggies and low in animal meats, cheeses, and dairy products   Orders Placed This Encounter  Procedures   CBC with Differential/Platelet   COMPLETE METABOLIC PANEL WITH GFR   Magnesium   Lipid panel   TSH   Hemoglobin A1c   Microalbumin / creatinine urine ratio   Urinalysis, Routine w reflex microscopic   EKG 12-Lead   HM DIABETES FOOT EXAM    Discussed med's effects and SE's. Screening labs and tests as requested with regular follow-up as recommended.  She declined q75m OV, receptive to q72m  Further disposition pending results if labs check today. Discussed med's effects and SE's.   Over 30 minutes of face to face interview, exam, counseling, chart review, and critical decision making was performed.    Future Appointments  Date Time Provider [place]  09/03/2021 11:30 AM Unk Pinto, MD [facility] None  09/24/2021 10:30 AM Narda Amber K, DO [facility] None  12/05/2021 11:00 AM Liane Comber, NP [facility] None  12/21/2021  1:00 PM Bernarda Caffey, MD [facility] None  05/02/2022  3:00 PM Liane Comber, NP [facility] None    Plan:   During the course of the visit the patient was educated and counseled about appropriate screening and preventive services including:   Pneumococcal vaccine  Prevnar 13 Influenza vaccine Td vaccine Screening electrocardiogram Bone densitometry screening Colorectal cancer screening Diabetes screening Glaucoma screening Nutrition counseling  Advanced directives: requested  ______________________________________________________________________  HPI 70 y.o. female  presents for  a 3 month follow up and CPE. She has Diverticulosis of large intestine; Restless leg syndrome; Hyperlipidemia  associated with type 2 diabetes mellitus ([place]); Vitamin D deficiency; Essential hypertension; Diabetic neuropathy ([place]); Meralgia paresthetica of both lower extremities; Medication management; Obesity (BMI 30.0-34.9); Type 2 diabetes mellitus with stage 3a chronic kidney disease, without long-term current use of insulin ([place]); Osteopenia; and CKD stage 3 due to type 2 diabetes mellitus ([place]) on their problem list.   She retired May 2nd 2018, states she is doing well.  Widowed, 2 grown children, 2 grandsons are in their 35s. NO health or medication concerns today.  She had had bilateral leg swelling for years, left leg always worse than the right. No warmth, no pain, no redness. Does compression intermittently, doesn't tolerate diuretics.   She is on requip for RLS which helps, she follows with Dr. Posey Pronto, on 3 at night and occ 0.25mg  during the day.   BMI is Body mass index is 31.3 kg/m., she has been working on diet, makes good choices, limited exercise, too hot. Does do ankle exercise.  Wt Readings from Last 3 Encounters:  05/02/21 185 lb 3.2 oz (84 kg)  12/05/20 184 lb (83.5 kg)  09/25/20 183 lb (83 kg)   She does not check BP at home due to always been steady, today their BP is BP: 132/76 She does not workout. She denies chest pain,  dizziness. She will have some SOB with dragging the trash can up hill in her driveway of 956 ft, no CP with it, no dizziness, nausea or fatigue.   She is not on cholesterol medication, she absolutely declines any statins due to her husband's very poor experience, she is on zetia 10 mg daily. Her cholesterol is not at goal of LDL <70. The cholesterol last visit was:   Lab Results  Component Value Date   CHOL 136 12/05/2020   HDL 41 (L) 12/05/2020   LDLCALC 74 12/05/2020   TRIG 133 12/05/2020   CHOLHDL 3.3 12/05/2020   She has been working on diet and exercise for diabetes  CKD stage III on ACE Neuropathy Hyperlipidemia - declines statin, on zetia  Eye  exam 12/2020 no retinopathy Glipizide - 1 tab TID except 1/2 tab if walking  could not tolerate the metformin farxiga was too expensive but did help with swelling in her legs denies polydipsia, polyuria and visual disturbances.  She has glucometer  Last A1C in the office was:  Lab Results  Component Value Date   HGBA1C 7.3 (H) 12/05/2020   Lab Results  Component Value Date   GFRNONAA 62 (L) 12/05/2020   Patient is on Vitamin D supplement, 4000 IU daily   Lab Results  Component Value Date   VD25OH 66 08/15/2020     Current Medications:   Current Outpatient Medications (Endocrine & Metabolic):    glipiZIDE (GLUCOTROL) 5 MG tablet, TAKE 1/2 TO 1 TABLET BY MOUTH THREE TIMES DAILY WITH MEALS FOR DIABETES  Current Outpatient Medications (Cardiovascular):    ezetimibe (ZETIA) 10 MG tablet, Take 1 tablet (10 mg total) by mouth daily.   lisinopril (ZESTRIL) 10 MG tablet, TAKE 1 TABLET BY MOUTH DAILY FOR BLOOD PRESSURE   Current Outpatient Medications (Analgesics):    acetaminophen (TYLENOL) 325 MG tablet, Take 650 mg by mouth every 6 (six) hours as needed.   aspirin EC 81 MG tablet, Take 81 mg by mouth daily.   Current Outpatient Medications (Other):    CRANBERRY EXTRACT PO, Take 2 tablets  by mouth daily.   FIBER ADULT GUMMIES PO, Take by mouth daily.   Multiple Vitamins-Minerals (MULTIVITAMIN PO), Take by mouth daily.   OVER THE COUNTER MEDICATION, Uses OTC eye drops for dry eyes.   Phenazopyridine HCl (AZO TABS PO), Take 2 tablets by mouth daily.   rOPINIRole (REQUIP) 0.5 MG tablet, Take 1 tablet at noon and 4pm   rOPINIRole (REQUIP) 3 MG tablet, TAKE 1 TABLET BY MOUTH AT BEDTIME FOR  RESTLESS  LEGS   VITAMIN D, CHOLECALCIFEROL, PO, Take 4,000 Units by mouth daily.   Health Maintenance:   Immunization History  Administered Date(s) Administered   Influenza, High Dose Seasonal PF 08/01/2017, 09/22/2018, 07/20/2019   Moderna SARS-COV2 Booster Vaccination 01/12/2021    Moderna Sars-Covid-2 Vaccination 01/18/2020, 02/15/2020, 08/18/2020   Pneumococcal Conjugate-13 04/05/2016   Pneumococcal Polysaccharide-23 04/15/2017   Tdap 03/07/2011   Tetanus: 2012 - will boost PRN due to cost Pneumovax: 2018 Prevnar 13: 2017 Flu vaccine: 2019  Shingrix: declines Covid 19: 2/2 + 2   Pap: 2016 negative, DONE MGM: 07/2020  DEXA: 2021 normal, declines for now  Colonoscopy: 06/2016, Dr. Deatra Ina, 10 years, family hx of colon cancer EGD: N/A  Eye exam: Dr. Coralyn Pear  Dentist: Dr. Harrington Challenger q 6 months 2022  Patient Care Team: Unk Pinto, MD as PCP - General (Internal Medicine) Unk Pinto, MD as PCP - Internal Medicine (Internal Medicine) Phylliss Bob, MD as Consulting Physician (Orthopedic Surgery) Alda Berthold, DO as Consulting Physician (Neurology)   Allergies:  Allergies  Allergen Reactions   Hctz [Hydrochlorothiazide]     Leg cramping   Metformin And Related Nausea Only   Phenylbutazones Swelling   Tropicamide Swelling    Red /itching/watery eyes   Medical History:  Past Medical History:  Diagnosis Date   Allergy    otc med prn   Arthritis    hips, lower back   Cataract    bilateral   Chronic kidney disease    stage 3 per patient    Diet-controlled type 2 diabetes mellitus ([place])    Diet controlled, no meds   Diverticulosis    Dribbling urine    EKG abnormalities    pt states she had a BBB on her last EKG, no problems   Essential hypertension, benign 08/19/2013   HPV in female    Hyperlipidemia    diet controlled, no med   Restless leg syndrome    Snoring    Vitamin D deficiency    Surgical History:  She  has a past surgical history that includes Vaginal hysterectomy (1992); Tubal ligation (1981); Ovarian cyst removal (1981); Lumbar laminectomy/decompression microdiscectomy (05/07/2012); Colonoscopy (2012); and Breast biopsy (Left, 1969).  Family History:  Her family history includes Atrial fibrillation in her brother,  brother, sister, sister, and sister; Cataracts in her mother; Colon cancer (age of onset: 56) in her mother; Colon polyps in her brother and sister; Diabetes in her mother, paternal grandfather, sister, and sister; Heart failure in her mother; Heart failure (age of onset: 40) in her father; Hypertension in her mother; Kidney disease in her father; Stroke in her mother; Stroke (age of onset: 33) in her sister.   Social History:  She  reports that she has never smoked. She has been exposed to tobacco smoke. She has never used smokeless tobacco. She reports that she does not drink alcohol and does not use drugs.   Review of Systems  Constitutional: Negative.  Negative for malaise/fatigue and weight loss.  HENT: Negative.  Negative  for hearing loss and tinnitus.   Eyes: Negative.  Negative for blurred vision and double vision.  Respiratory:  Negative for cough, hemoptysis, sputum production, shortness of breath and wheezing.   Cardiovascular: Negative.  Negative for chest pain, palpitations, orthopnea, claudication, leg swelling and PND.  Gastrointestinal: Negative.  Negative for abdominal pain, blood in stool, constipation, diarrhea, heartburn, melena, nausea and vomiting.  Genitourinary: Negative.   Musculoskeletal:  Negative for back pain, falls, joint pain, myalgias and neck pain.  Skin:  Negative for itching and rash.  Neurological:  Negative for dizziness, tingling, tremors, sensory change, weakness and headaches.  Endo/Heme/Allergies:  Negative for polydipsia.  Psychiatric/Behavioral: Negative.  Negative for depression, memory loss, substance abuse and suicidal ideas. The patient is not nervous/anxious and does not have insomnia.   All other systems reviewed and are negative.  Physical Exam: Estimated body mass index is 31.3 kg/m as calculated from the following:   Height as of this encounter: 5' 4.5" (1.638 m).   Weight as of this encounter: 185 lb 3.2 oz (84 kg). BP 132/76   Pulse 79    Temp (!) 97.2 F (36.2 C)   Ht 5' 4.5" (1.638 m)   Wt 185 lb 3.2 oz (84 kg)   SpO2 99%   BMI 31.30 kg/m    General Appearance: Well nourished, in no apparent distress. Eyes: PERRLA, EOMs, conjunctiva no swelling or erythema, normal fundi and vessels. Sinuses: No Frontal/maxillary tenderness ENT/Mouth: Ext aud canals clear, normal light reflex with TMs without erythema, bulging.  Good dentition. No erythema, swelling, or exudate on post pharynx. Tonsils not swollen or erythematous. Hearing normal.  Neck: Supple, thyroid normal. No bruits Respiratory: Respiratory effort normal, BS equal bilaterally without rales, rhonchi, wheezing or stridor. Cardio: RRR without murmurs, rubs or gallops. Brisk peripheral pulses without edema.  Chest: symmetric, with normal excursions and percussion. Breasts: defer, getting annual mammograms, no concerns Abdomen: Soft, +BS, Non tender, no guarding, rebound, hernias, masses, or organomegaly. .  Lymphatics: Non tender without lymphadenopathy.  Musculoskeletal: Full ROM all peripheral extremities,5/5 strength, and normal gait.  Skin: + varicose veins bilateral legs. Warm, dry without rashes, lesions, ecchymosis.  Neuro: Cranial nerves intact, reflexes equal bilaterally. Normal muscle tone, no cerebellar symptoms. Sensation intact Psych: Awake and oriented X 3, normal affect, Insight and Judgment appropriate.   EKG: NSR, NSCPT  Izora Ribas, NP 5:38 PM [facility]

## 2021-05-03 LAB — CBC WITH DIFFERENTIAL/PLATELET
Absolute Monocytes: 330 cells/uL (ref 200–950)
Basophils Absolute: 28 cells/uL (ref 0–200)
Basophils Relative: 0.5 %
Eosinophils Absolute: 252 cells/uL (ref 15–500)
Eosinophils Relative: 4.5 %
HCT: 39.5 % (ref 35.0–45.0)
Hemoglobin: 13.6 g/dL (ref 11.7–15.5)
Lymphs Abs: 1999 cells/uL (ref 850–3900)
MCH: 30.4 pg (ref 27.0–33.0)
MCHC: 34.4 g/dL (ref 32.0–36.0)
MCV: 88.2 fL (ref 80.0–100.0)
MPV: 9.6 fL (ref 7.5–12.5)
Monocytes Relative: 5.9 %
Neutro Abs: 2990 cells/uL (ref 1500–7800)
Neutrophils Relative %: 53.4 %
Platelets: 163 10*3/uL (ref 140–400)
RBC: 4.48 10*6/uL (ref 3.80–5.10)
RDW: 12.8 % (ref 11.0–15.0)
Total Lymphocyte: 35.7 %
WBC: 5.6 10*3/uL (ref 3.8–10.8)

## 2021-05-03 LAB — URINALYSIS, ROUTINE W REFLEX MICROSCOPIC
Bilirubin Urine: NEGATIVE
Glucose, UA: NEGATIVE
Hgb urine dipstick: NEGATIVE
Ketones, ur: NEGATIVE
Leukocytes,Ua: NEGATIVE
Nitrite: NEGATIVE
Protein, ur: NEGATIVE
Specific Gravity, Urine: 1.006 (ref 1.001–1.035)
pH: 6.5 (ref 5.0–8.0)

## 2021-05-03 LAB — HEMOGLOBIN A1C
Hgb A1c MFr Bld: 7.2 % of total Hgb — ABNORMAL HIGH (ref <5.7)
Mean Plasma Glucose: 160 mg/dL
eAG (mmol/L): 8.9 mmol/L

## 2021-05-03 LAB — COMPLETE METABOLIC PANEL WITH GFR
AG Ratio: 1.5 (calc) (ref 1.0–2.5)
ALT: 24 U/L (ref 6–29)
AST: 21 U/L (ref 10–35)
Albumin: 4.4 g/dL (ref 3.6–5.1)
Alkaline phosphatase (APISO): 71 U/L (ref 37–153)
BUN/Creatinine Ratio: 14 (calc) (ref 6–22)
BUN: 15 mg/dL (ref 7–25)
CO2: 27 mmol/L (ref 20–32)
Calcium: 9.4 mg/dL (ref 8.6–10.4)
Chloride: 106 mmol/L (ref 98–110)
Creat: 1.05 mg/dL — ABNORMAL HIGH (ref 0.60–1.00)
Globulin: 2.9 g/dL (calc) (ref 1.9–3.7)
Glucose, Bld: 96 mg/dL (ref 65–99)
Potassium: 4.2 mmol/L (ref 3.5–5.3)
Sodium: 140 mmol/L (ref 135–146)
Total Bilirubin: 0.4 mg/dL (ref 0.2–1.2)
Total Protein: 7.3 g/dL (ref 6.1–8.1)
eGFR: 57 mL/min/{1.73_m2} — ABNORMAL LOW (ref >=60)

## 2021-05-03 LAB — LIPID PANEL
Cholesterol: 131 mg/dL (ref <200)
HDL: 44 mg/dL — ABNORMAL LOW (ref >=50)
LDL Cholesterol (Calc): 69 mg/dL (calc)
Non-HDL Cholesterol (Calc): 87 mg/dL (calc) (ref <130)
Total CHOL/HDL Ratio: 3 (calc) (ref <5.0)
Triglycerides: 101 mg/dL (ref <150)

## 2021-05-03 LAB — MICROALBUMIN / CREATININE URINE RATIO
Creatinine, Urine: 30 mg/dL (ref 20–275)
Microalb Creat Ratio: 10 mcg/mg creat (ref <30)
Microalb, Ur: 0.3 mg/dL

## 2021-05-03 LAB — MAGNESIUM: Magnesium: 2 mg/dL (ref 1.5–2.5)

## 2021-05-03 LAB — TSH: TSH: 1.18 mIU/L (ref 0.40–4.50)

## 2021-05-08 ENCOUNTER — Other Ambulatory Visit: Payer: Self-pay | Admitting: Adult Health

## 2021-05-08 DIAGNOSIS — E1165 Type 2 diabetes mellitus with hyperglycemia: Secondary | ICD-10-CM

## 2021-05-18 ENCOUNTER — Encounter: Payer: Self-pay | Admitting: Internal Medicine

## 2021-07-06 DIAGNOSIS — Z23 Encounter for immunization: Secondary | ICD-10-CM | POA: Diagnosis not present

## 2021-07-17 ENCOUNTER — Other Ambulatory Visit: Payer: Self-pay | Admitting: Internal Medicine

## 2021-07-17 DIAGNOSIS — Z1231 Encounter for screening mammogram for malignant neoplasm of breast: Secondary | ICD-10-CM

## 2021-08-14 ENCOUNTER — Other Ambulatory Visit: Payer: Self-pay

## 2021-08-14 ENCOUNTER — Ambulatory Visit
Admission: RE | Admit: 2021-08-14 | Discharge: 2021-08-14 | Disposition: A | Discharge status: Home / Self Care | Payer: Medicare Other | Source: Ambulatory Visit | Attending: Internal Medicine | Admitting: Internal Medicine

## 2021-08-14 DIAGNOSIS — Z1231 Encounter for screening mammogram for malignant neoplasm of breast: Secondary | ICD-10-CM

## 2021-09-02 ENCOUNTER — Encounter: Payer: Self-pay | Admitting: Internal Medicine

## 2021-09-02 NOTE — Progress Notes (Signed)
Annual Screening/Preventative Visit & Comprehensive Evaluation &  Examination  Future Appointments  Date Time Provider [place]  09/03/2021 11:30 AM Unk Pinto, MD [facility] None  09/24/2021 10:30 AM Narda Amber K, DO [facility] None  12/05/2021 11:00 AM Liane Comber, NP [facility] None  12/21/2021  1:00 PM Bernarda Caffey, MD [facility] None  05/02/2022  3:00 PM Liane Comber, NP [facility] None        This very nice 70 y.o.female presents for a Screening /Preventative Visit & comprehensive evaluation and management of multiple medical co-morbidities.  Patient has been followed for HTN, HLD, T2_NIDDM  Prediabetes  and Vitamin D Deficiency.        HTN predates since     . Patient's BP has been controlled at home and patient denies any cardiac symptoms as chest pain, palpitations, shortness of breath, dizziness or ankle swelling. Today's          Patient's hyperlipidemia is controlled with diet and medications. Patient denies myalgias or other medication SE's. Last lipids were   Lab Results  Component Value Date   CHOL 131 05/02/2021   HDL 44 (L) 05/02/2021   LDLCALC 69 05/02/2021   TRIG 101 05/02/2021   CHOLHDL 3.0 05/02/2021         Patient has hx/o T2_NIDDM prediabetes predating since      and patient denies reactive hypoglycemic symptoms, visual blurring, diabetic polys or paresthesias. Last A1c was   Lab Results  Component Value Date   HGBA1C 7.2 (H) 05/02/2021         Finally, patient has history of Vitamin D Deficiency and last Vitamin D was   Lab Results  Component Value Date   VD25OH 66 08/15/2020     Current Outpatient Medications on File Prior to Visit  Medication Sig   acetaminophen (TYLENOL) 325 MG tablet Take 650 mg by mouth every 6 (six) hours as needed.   aspirin EC 81 MG tablet Take 81 mg by mouth daily.   CRANBERRY EXTRACT PO Take 2 tablets by mouth daily.   ezetimibe (ZETIA) 10 MG tablet Take 1 tablet (10 mg total) by mouth daily.    FIBER ADULT GUMMIES PO Take by mouth daily.   glipiZIDE (GLUCOTROL) 5 MG tablet Take  1/2 to 1 tablet  3 x /day   with Meals for Diabetes  / Patient knows to take by mouth   lisinopril (ZESTRIL) 10 MG tablet TAKE 1 TABLET BY MOUTH DAILY FOR BLOOD PRESSURE   Multiple Vitamins-Minerals (MULTIVITAMIN PO) Take by mouth daily.   OVER THE COUNTER MEDICATION Uses OTC eye drops for dry eyes.   Phenazopyridine HCl (AZO TABS PO) Take 2 tablets by mouth daily.   rOPINIRole (REQUIP) 0.5 MG tablet Take 1 tablet at noon and 4pm   rOPINIRole (REQUIP) 3 MG tablet TAKE 1 TABLET BY MOUTH AT BEDTIME FOR  RESTLESS  LEGS   VITAMIN D, CHOLECALCIFEROL, PO Take 4,000 Units by mouth daily.   No current facility-administered medications on file prior to visit.     Allergies  Allergen Reactions   Hctz [Hydrochlorothiazide]     Leg cramping   Metformin And Related Nausea Only   Phenylbutazones Swelling   Tropicamide Swelling    Red /itching/watery eyes     Past Medical History:  Diagnosis Date   Allergy    otc med prn   Arthritis    hips, lower back   Cataract    bilateral   Chronic kidney disease    stage  3 per patient    Diet-controlled type 2 diabetes mellitus ([place])    Diet controlled, no meds   Diverticulosis    Dribbling urine    EKG abnormalities    pt states she had a BBB on her last EKG, no problems   Essential hypertension, benign 08/19/2013   HPV in female    Hyperlipidemia    diet controlled, no med   Restless leg syndrome    Snoring    Vitamin D deficiency      Health Maintenance  Topic Date Due   COVID-19 Vaccine (4 - Booster for Moderna series) 03/09/2021   INFLUENZA VACCINE  05/14/2021   TETANUS/TDAP  07/03/2021   Zoster Vaccines- Shingrix (1 of 2) 05/02/2022 (Originally 10/13/1970)   HEMOGLOBIN A1C  11/02/2021   OPHTHALMOLOGY EXAM  12/20/2021   FOOT EXAM  05/02/2022   MAMMOGRAM  08/15/2023   COLONOSCOPY (Pts 45-44yrs Insurance coverage will need to be confirmed)   07/12/2026   Pneumonia Vaccine 1+ Years old  Completed   DEXA SCAN  Completed   Hepatitis C Screening  Completed   HPV VACCINES  Aged Out     Immunization History  Administered Date(s) Administered   Influenza, High Dose Seasonal PF 08/01/2017, 09/22/2018, 07/20/2019   Moderna SARS-COV2 Booster Vaccination 01/12/2021   Moderna Sars-Covid-2 Vaccination 01/18/2020, 02/15/2020, 08/18/2020   Pneumococcal Conjugate-13 04/05/2016   Pneumococcal Polysaccharide-23 04/15/2017   Tdap 03/07/2011     Last Colon -    Last MGM -    Past Surgical History:  Procedure Laterality Date   BREAST BIOPSY Left 1969   left lumpectomy - benign   COLONOSCOPY  2012   kaplan-polyps, tics   LUMBAR LAMINECTOMY/DECOMPRESSION MICRODISCECTOMY  05/07/2012   Procedure: LUMBAR LAMINECTOMY/DECOMPRESSION MICRODISCECTOMY;  Surgeon: Sinclair Ship, MD;  Location: [place];  Service: Orthopedics;  Laterality: Bilateral;  Lumbar 4-5 decompression   OVARIAN CYST REMOVAL  1981   right   TUBAL LIGATION  1981   VAGINAL HYSTERECTOMY  1992     Family History  Problem Relation Age of Onset   Hypertension Mother    Diabetes Mother    Stroke Mother    Heart failure Mother    Colon cancer Mother 44   Cataracts Mother    Heart failure Father 2   Kidney disease Father    Stroke Sister 50   Colon polyps Sister    Atrial fibrillation Sister    Diabetes Sister    Atrial fibrillation Sister    Diabetes Sister    Atrial fibrillation Sister    Colon polyps Brother    Atrial fibrillation Brother    Atrial fibrillation Brother    Diabetes Paternal Grandfather    Stomach cancer Neg Hx    Esophageal cancer Neg Hx    Rectal cancer Neg Hx    Amblyopia Neg Hx    Blindness Neg Hx    Glaucoma Neg Hx    Macular degeneration Neg Hx    Retinal detachment Neg Hx    Strabismus Neg Hx    Retinitis pigmentosa Neg Hx    Breast cancer Neg Hx      Social History   Tobacco Use   Smoking status: Never    Passive  exposure: Yes   Smokeless tobacco: Never  Vaping Use   Vaping Use: Never used  Substance Use Topics   Alcohol use: No    Alcohol/week: 0.0 standard drinks   Drug use: No      ROS Constitutional: Denies  fever, chills, weight loss/gain, headaches, insomnia,  night sweats, and change in appetite. Does c/o fatigue. Eyes: Denies redness, blurred vision, diplopia, discharge, itchy, watery eyes.  ENT: Denies discharge, congestion, post nasal drip, epistaxis, sore throat, earache, hearing loss, dental pain, Tinnitus, Vertigo, Sinus pain, snoring.  Cardio: Denies chest pain, palpitations, irregular heartbeat, syncope, dyspnea, diaphoresis, orthopnea, PND, claudication, edema Respiratory: denies cough, dyspnea, DOE, pleurisy, hoarseness, laryngitis, wheezing.  Gastrointestinal: Denies dysphagia, heartburn, reflux, water brash, pain, cramps, nausea, vomiting, bloating, diarrhea, constipation, hematemesis, melena, hematochezia, jaundice, hemorrhoids Genitourinary: Denies dysuria, frequency, urgency, nocturia, hesitancy, discharge, hematuria, flank pain Breast: Breast lumps, nipple discharge, bleeding.  Musculoskeletal: Denies arthralgia, myalgia, stiffness, Jt. Swelling, pain, limp, and strain/sprain. Denies falls. Skin: Denies puritis, rash, hives, warts, acne, eczema, changing in skin lesion Neuro: No weakness, tremor, incoordination, spasms, paresthesia, pain Psychiatric: Denies confusion, memory loss, sensory loss. Denies Depression. Endocrine: Denies change in weight, skin, hair change, nocturia, and paresthesia, diabetic polys, visual blurring, hyper / hypo glycemic episodes.  Heme/Lymph: No excessive bleeding, bruising, enlarged lymph nodes.  Physical Exam  There were no vitals taken for this visit.  General Appearance: Well nourished, well groomed and in no apparent distress.  Eyes: PERRLA, EOMs, conjunctiva no swelling or erythema, normal fundi and vessels. Sinuses: No  frontal/maxillary tenderness ENT/Mouth: EACs patent / TMs  nl. Nares clear without erythema, swelling, mucoid exudates. Oral hygiene is good. No erythema, swelling, or exudate. Tongue normal, non-obstructing. Tonsils not swollen or erythematous. Hearing normal.  Neck: Supple, thyroid not palpable. No bruits, nodes or JVD. Respiratory: Respiratory effort normal.  BS equal and clear bilateral without rales, rhonci, wheezing or stridor. Cardio: Heart sounds are normal with regular rate and rhythm and no murmurs, rubs or gallops. Peripheral pulses are normal and equal bilaterally without edema. No aortic or femoral bruits. Chest: symmetric with normal excursions and percussion. Breasts: Symmetric, without lumps, nipple discharge, retractions, or fibrocystic changes.  Abdomen: Flat, soft with bowel sounds active. Nontender, no guarding, rebound, hernias, masses, or organomegaly.  Lymphatics: Non tender without lymphadenopathy.  Genitourinary:  Musculoskeletal: Full ROM all peripheral extremities, joint stability, 5/5 strength, and normal gait. Skin: Warm and dry without rashes, lesions, cyanosis, clubbing or  ecchymosis.  Neuro: Cranial nerves intact, reflexes equal bilaterally. Normal muscle tone, no cerebellar symptoms. Sensation intact.  Pysch: Alert and oriented X 3, normal affect, Insight and Judgment appropriate.    Assessment and Plan            Patient was counseled in prudent diet to achieve/maintain BMI less than 25 for weight control, BP monitoring, regular exercise and medications. Discussed med's effects and SE's. Screening labs and tests as requested with regular follow-up as recommended. Over 40 minutes of exam, counseling, chart review and high complex critical decision making was performed.   Kirtland Bouchard, MD

## 2021-09-02 NOTE — Patient Instructions (Signed)

## 2021-09-02 NOTE — Progress Notes (Signed)
Future Appointments  Date Time Provider [place]  09/03/2021 11:30 AM Unk Pinto, MD [facility] None  09/24/2021 10:30 AM Narda Amber K, DO [facility] None  12/05/2021         Wellness 11:00 AM Liane Comber, NP [facility] None  12/21/2021  1:00 PM Bernarda Caffey, MD [facility] None  05/02/2022        CPE  3:00 PM Liane Comber, NP [facility] None    History of Present Illness:       This very nice 70 y.o. WWF presents for 3 month follow up with HTN, HLD, Pre-Diabetes and Vitamin D Deficiency.        Patient is treated for HTN  since  2018 & BP has been controlled at home. Today's BP is at goal - 130/80. Patient has had no complaints of any cardiac type chest pain, palpitations, dyspnea / orthopnea / PND, dizziness, claudication, or dependent edema.       Hyperlipidemia is controlled with diet & Ezetimibe.  Patient denies myalgias or other med SE's. Last Lipids were at goal :  Lab Results  Component Value Date   CHOL 131 05/02/2021   HDL 44 (L) 05/02/2021   LDLCALC 69 05/02/2021   TRIG 101 05/02/2021   CHOLHDL 3.0 05/02/2021     Also, the patient has history of T2_NIDDM  on Glipizide since 2001 w/CKD3a  (GFR 57) and has had no symptoms of reactive hypoglycemia, diabetic polys, paresthesias or visual blurring.  Last A1c was not at goal :  Lab Results  Component Value Date   HGBA1C 7.2 (H) 05/02/2021                                                          Further, the patient also has history of Vitamin D Deficiency and supplements vitamin D without any suspected side-effects. Last vitamin D was at goal :  Lab Results  Component Value Date   VD25OH 66 08/15/2020     Current Outpatient Medications on File Prior to Visit  Medication Sig   acetaminophen (TYLENOL) 325 MG tablet Take 650 mg every 6 hours as needed.   aspirin EC 81 MG tablet Take  daily.   CRANBERRY EXTRACT PO Take 2 tablets daily.   ezetimibe 10 MG tablet Take 1 tablet daily.    FIBER ADULT GUMMIES  Take daily.   glipiZIDE 5 MG tablet Take  1/2 to 1 tablet  3 x /day   with Meals    lisinopril 10 MG tablet TAKE 1 TABLET DAILY    Multiple Vitamins-Minerals  Take  daily.   OTC eye drops  Uses for dry eyes.   Phenazopyridine (AZO  ) Take 2 tablets  daily.   rOPINIRole  0.5 MG tablet Take 1 tablet at noon and 4pm   VITAMIN D Take 4,000 Units daily.     Allergies  Allergen Reactions   Hctz [Hydrochlorothiazide]     Leg cramping   Metformin And Related Nausea Only   Phenylbutazones Swelling   Tropicamide Swelling    Red /itching/watery eyes    PMHx:   Past Medical History:  Diagnosis Date   Allergy    otc med prn   Arthritis    hips, lower back   Cataract    bilateral  Chronic kidney disease    stage 3 per patient    Diet-controlled type 2 diabetes mellitus ([place])    Diet controlled, no meds   Diverticulosis    Dribbling urine    EKG abnormalities    pt states she had a BBB on her last EKG, no problems   Essential hypertension, benign 08/19/2013   HPV in female    Hyperlipidemia    diet controlled, no med   Restless leg syndrome    Snoring    Vitamin D deficiency      Immunization History  Administered Date(s) Administered   Influenza, High Dose  08/01/2017, 09/22/2018, 07/20/2019   Moderna SARS-COV2 Booster 01/12/2021   Moderna Sars-Covid-2 Vacc 01/18/2020, 02/15/2020, 08/18/2020   Pneumococcal -13 04/05/2016   Pneumococcal -23 04/15/2017   Tdap 03/07/2011     Past Surgical History:  Procedure Laterality Date   BREAST BIOPSY Left 1969   left lumpectomy - benign   COLONOSCOPY  2012   kaplan-polyps, tics   LUMBAR LAMINECTOMY/DECOMPRESSION MICRODISCECTOMY  05/07/2012   Procedure: LUMBAR LAMINECTOMY/DECOMPRESSION MICRODISCECTOMY;  Surgeon: Sinclair Ship, MD;  Location: [facility];  Service: Orthopedics;  Laterality: Bilateral;  Lumbar 4-5 decompression   OVARIAN CYST REMOVAL  1981   right   TUBAL LIGATION  1981   VAGINAL  HYSTERECTOMY  1992    FHx:    Reviewed / unchanged  SHx:    Reviewed / unchanged   Systems Review:  Constitutional: Denies fever, chills, wt changes, headaches, insomnia, fatigue, night sweats, change in appetite. Eyes: Denies redness, blurred vision, diplopia, discharge, itchy, watery eyes.  ENT: Denies discharge, congestion, post nasal drip, epistaxis, sore throat, earache, hearing loss, dental pain, tinnitus, vertigo, sinus pain, snoring.  CV: Denies chest pain, palpitations, irregular heartbeat, syncope, dyspnea, diaphoresis, orthopnea, PND, claudication or edema. Respiratory: denies cough, dyspnea, DOE, pleurisy, hoarseness, laryngitis, wheezing.  Gastrointestinal: Denies dysphagia, odynophagia, heartburn, reflux, water brash, abdominal pain or cramps, nausea, vomiting, bloating, diarrhea, constipation, hematemesis, melena, hematochezia  or hemorrhoids. Genitourinary: Denies dysuria, frequency, urgency, nocturia, hesitancy, discharge, hematuria or flank pain. Musculoskeletal: Denies arthralgias, myalgias, stiffness, jt. swelling, pain, limping or strain/sprain.  Skin: Denies pruritus, rash, hives, warts, acne, eczema or change in skin lesion(s). Neuro: No weakness, tremor, incoordination, spasms, paresthesia or pain. Psychiatric: Denies confusion, memory loss or sensory loss. Endo: Denies change in weight, skin or hair change.  Heme/Lymph: No excessive bleeding, bruising or enlarged lymph nodes.  Physical Exam  BP 130/80   Pulse 85   Temp 97.8 F (36.6 C)   Resp 16   Ht 5' 4.5" (1.638 m)   Wt 184 lb 3.2 oz (83.6 kg)   SpO2 98%   BMI 31.13 kg/m   Appears  well nourished, well groomed  and in no distress.  Eyes: PERRLA, EOMs, conjunctiva no swelling or erythema. Sinuses: No frontal/maxillary tenderness ENT/Mouth: EAC's clear, TM's nl w/o erythema, bulging. Nares clear w/o erythema, swelling, exudates. Oropharynx clear without erythema or exudates. Oral hygiene is good.  Tongue normal, non obstructing. Hearing intact.  Neck: Supple. Thyroid not palpable. Car 2+/2+ without bruits, nodes or JVD. Chest: Respirations nl with BS clear & equal w/o rales, rhonchi, wheezing or stridor.  Cor: Heart sounds normal w/ regular rate and rhythm without sig. murmurs, gallops, clicks or rubs. Peripheral pulses normal and equal  without edema.  Abdomen: Soft & bowel sounds normal. Non-tender w/o guarding, rebound, hernias, masses or organomegaly.  Lymphatics: Unremarkable.  Musculoskeletal: Full ROM all peripheral extremities, joint stability, 5/5  strength and normal gait.  Skin: Warm, dry without exposed rashes, lesions or ecchymosis apparent.  Neuro: Cranial nerves intact, reflexes equal bilaterally. Sensory-motor testing grossly intact. Tendon reflexes grossly intact.  Pysch: Alert & oriented x 3.  Insight and judgement nl & appropriate. No ideations.  Assessment and Plan:  1. Essential hypertension  - Continue medication, monitor blood pressure at home.  - Continue DASH diet.  Reminder to go to the ER if any CP,  SOB, nausea, dizziness, severe HA, changes vision/speech.    - CBC with Differential/Platelet - COMPLETE METABOLIC PANEL WITH GFR - Magnesium - TSH  2. Hyperlipidemia associated with type 2 diabetes mellitus ([place])  - Continue diet/meds, exercise,& lifestyle modifications.  - Continue monitor periodic cholesterol/liver & renal functions   - Lipid panel - TSH  3. Type 2 diabetes mellitus with stage 3a chronic kidney  disease, without long-term current use of insulin ([facility])  - Continue diet, exercise  - Lifestyle modifications.  - Monitor appropriate labs   - Fructosamine - Insulin, random - PTH, intact and calcium  4. Vitamin D deficiency  - Continue supplementation     - VITAMIN D 25 Hydroxy   5. Medication management  - CBC with Differential/Platelet - COMPLETE METABOLIC PANEL WITH GFR - Magnesium - Lipid panel - TSH -  Fructosamine - Insulin, random - VITAMIN D 25 Hydroxy  - PTH, intact and calcium          Discussed  regular exercise, BP monitoring, weight control to achieve/maintain BMI less than 25 and discussed med and SE's. Recommended labs to assess and monitor clinical status with further disposition pending results of labs.  I discussed the assessment and treatment plan with the patient. The patient was provided an opportunity to ask questions and all were answered. The patient agreed with the plan and demonstrated an understanding of the instructions.  I provided over 30 minutes of exam, counseling, chart review and  complex critical decision making.        The patient was advised to call back or seek an in-person evaluation if the symptoms worsen or if the condition fails to improve as anticipated.   Kirtland Bouchard, MD

## 2021-09-03 ENCOUNTER — Other Ambulatory Visit: Payer: Self-pay

## 2021-09-03 ENCOUNTER — Ambulatory Visit (INDEPENDENT_AMBULATORY_CARE_PROVIDER_SITE_OTHER): Payer: Medicare Other | Admitting: Internal Medicine

## 2021-09-03 ENCOUNTER — Encounter: Payer: Self-pay | Admitting: Internal Medicine

## 2021-09-03 VITALS — BP 130/80 | HR 85 | Temp 97.8°F | Resp 16 | Ht 64.5 in | Wt 184.2 lb

## 2021-09-03 DIAGNOSIS — E1122 Type 2 diabetes mellitus with diabetic chronic kidney disease: Secondary | ICD-10-CM | POA: Diagnosis not present

## 2021-09-03 DIAGNOSIS — E1169 Type 2 diabetes mellitus with other specified complication: Secondary | ICD-10-CM | POA: Diagnosis not present

## 2021-09-03 DIAGNOSIS — E785 Hyperlipidemia, unspecified: Secondary | ICD-10-CM | POA: Diagnosis not present

## 2021-09-03 DIAGNOSIS — Z79899 Other long term (current) drug therapy: Secondary | ICD-10-CM | POA: Diagnosis not present

## 2021-09-03 DIAGNOSIS — E559 Vitamin D deficiency, unspecified: Secondary | ICD-10-CM | POA: Diagnosis not present

## 2021-09-03 DIAGNOSIS — N1831 Chronic kidney disease, stage 3a: Secondary | ICD-10-CM

## 2021-09-03 DIAGNOSIS — I10 Essential (primary) hypertension: Secondary | ICD-10-CM | POA: Diagnosis not present

## 2021-09-04 NOTE — Progress Notes (Signed)
============================================================ -   Test results slightly outside the reference range are not unusual. If there is anything important, I will review this with you,  otherwise it is considered normal test values.  If you have further questions,  please do not hesitate to contact me at the office or via My Chart.  ============================================================ ============================================================  -  Kidney functions still Stage 3a  (GFR = 53)   & Stable   - Great  ============================================================ ============================================================  -  Total Chol = 174 -  Excellent   - Very low risk for Heart Attack  / Stroke ============================================================ ============================================================  -  Vitamin D = 75  - Excellent  - Please continue dosing Same  ============================================================ ============================================================  -  PTH - Hormone that regulates calcium balance  is Normal  -  Great ! ============================================================ ============================================================  -  All Else - CBC - Kidneys - Electrolytes - Liver - Magnesium & Thyroid    - all  Normal / OK ============================================================ ============================================================

## 2021-09-09 LAB — CBC WITH DIFFERENTIAL/PLATELET
Absolute Monocytes: 413 cells/uL (ref 200–950)
Basophils Absolute: 42 cells/uL (ref 0–200)
Basophils Relative: 0.6 %
Eosinophils Absolute: 168 cells/uL (ref 15–500)
Eosinophils Relative: 2.4 %
HCT: 44 % (ref 35.0–45.0)
Hemoglobin: 14.7 g/dL (ref 11.7–15.5)
Lymphs Abs: 2618 cells/uL (ref 850–3900)
MCH: 29.7 pg (ref 27.0–33.0)
MCHC: 33.4 g/dL (ref 32.0–36.0)
MCV: 88.9 fL (ref 80.0–100.0)
MPV: 9.5 fL (ref 7.5–12.5)
Monocytes Relative: 5.9 %
Neutro Abs: 3759 cells/uL (ref 1500–7800)
Neutrophils Relative %: 53.7 %
Platelets: 177 10*3/uL (ref 140–400)
RBC: 4.95 10*6/uL (ref 3.80–5.10)
RDW: 12.7 % (ref 11.0–15.0)
Total Lymphocyte: 37.4 %
WBC: 7 10*3/uL (ref 3.8–10.8)

## 2021-09-09 LAB — COMPLETE METABOLIC PANEL WITH GFR
AG Ratio: 1.2 (calc) (ref 1.0–2.5)
ALT: 23 U/L (ref 6–29)
AST: 20 U/L (ref 10–35)
Albumin: 4.3 g/dL (ref 3.6–5.1)
Alkaline phosphatase (APISO): 74 U/L (ref 37–153)
BUN/Creatinine Ratio: 13 (calc) (ref 6–22)
BUN: 15 mg/dL (ref 7–25)
CO2: 26 mmol/L (ref 20–32)
Calcium: 9.5 mg/dL (ref 8.6–10.4)
Chloride: 103 mmol/L (ref 98–110)
Creat: 1.12 mg/dL — ABNORMAL HIGH (ref 0.60–1.00)
Globulin: 3.5 g/dL (calc) (ref 1.9–3.7)
Glucose, Bld: 81 mg/dL (ref 65–99)
Potassium: 4 mmol/L (ref 3.5–5.3)
Sodium: 139 mmol/L (ref 135–146)
Total Bilirubin: 0.4 mg/dL (ref 0.2–1.2)
Total Protein: 7.8 g/dL (ref 6.1–8.1)
eGFR: 53 mL/min/{1.73_m2} — ABNORMAL LOW (ref >=60)

## 2021-09-09 LAB — LIPID PANEL
Cholesterol: 174 mg/dL (ref <200)
HDL: 42 mg/dL — ABNORMAL LOW (ref >=50)
LDL Cholesterol (Calc): 103 mg/dL (calc) — ABNORMAL HIGH
Non-HDL Cholesterol (Calc): 132 mg/dL (calc) — ABNORMAL HIGH (ref <130)
Total CHOL/HDL Ratio: 4.1 (calc) (ref <5.0)
Triglycerides: 169 mg/dL — ABNORMAL HIGH (ref <150)

## 2021-09-09 LAB — TSH: TSH: 1.14 mIU/L (ref 0.40–4.50)

## 2021-09-09 LAB — PTH, INTACT AND CALCIUM
Calcium: 9.5 mg/dL (ref 8.6–10.4)
PTH: 37 pg/mL (ref 16–77)

## 2021-09-09 LAB — MAGNESIUM: Magnesium: 1.9 mg/dL (ref 1.5–2.5)

## 2021-09-09 LAB — VITAMIN D 25 HYDROXY (VIT D DEFICIENCY, FRACTURES): Vit D, 25-Hydroxy: 75 ng/mL (ref 30–100)

## 2021-09-09 LAB — FRUCTOSAMINE: Fructosamine: 351 umol/L — ABNORMAL HIGH (ref 205–285)

## 2021-09-09 LAB — INSULIN, RANDOM: Insulin: 28.4 u[IU]/mL — ABNORMAL HIGH

## 2021-09-09 NOTE — Progress Notes (Signed)
============================================================ ============================================================   Finally returned result  -  PTH hormone level that regulates calcium balance &                                                            calcium level are both Normal - Great   !   ============================================================ ============================================================

## 2021-09-24 ENCOUNTER — Other Ambulatory Visit: Payer: Self-pay

## 2021-09-24 ENCOUNTER — Encounter: Payer: Self-pay | Admitting: Neurology

## 2021-09-24 ENCOUNTER — Ambulatory Visit (INDEPENDENT_AMBULATORY_CARE_PROVIDER_SITE_OTHER): Payer: Medicare Other | Admitting: Neurology

## 2021-09-24 DIAGNOSIS — G2581 Restless legs syndrome: Secondary | ICD-10-CM | POA: Diagnosis not present

## 2021-09-24 MED ORDER — ROPINIROLE HCL 3 MG PO TABS: ORAL_TABLET | ORAL | 3 refills | Status: DC

## 2021-09-24 MED ORDER — GABAPENTIN 100 MG PO CAPS: ORAL_CAPSULE | ORAL | 2 refills | Status: DC

## 2021-09-24 MED ORDER — ROPINIROLE HCL 0.5 MG PO TABS: ORAL_TABLET | ORAL | 3 refills | Status: DC

## 2021-09-24 NOTE — Patient Instructions (Signed)
Start gabapentin 100mg  at 6p. Please send me an update in 2-3 weeks to see how you are doing. If you are not having side effects, we can increase the dose to 200mg .  Continue ropinirole as you are taking  Return to clinic in 1 year

## 2021-09-24 NOTE — Progress Notes (Signed)
Follow-up Visit   Date: 09/24/21    Rachel Daniel MRN: [id number] DOB: [birth year]   Interim History: Rachel Daniel is a 70 y.o. right-handed Caucasian female with diabetes mellitus (HbA1c 6.5), hypertension, RLS returning to the clinic for follow-up of restless leg syndrome.  The patient was accompanied to the clinic by self.   History of present illness: Starting around her 27s, she recalls having achy pain of the right thigh, which would prohibit her from falling asleep.  Her husband often complained that she would kick him all night long.  She has squeezing, stabbing, knife-like pain of bilateral thighs. The lower legs and feet are spared.  She only has relief with walking and stretching.  Pain was severe at night time, but over the past several years started having daytime symptoms and she cannot get relief with anything. Her pain is better on the weekends when she is less sedentary.  She was diagnosed with RLS in her 26s and started on ropinorole and takes 3mg  at bedtime which provides relief until lunch time.  She is unable to take ropinorole during the day because of sedation.  She is intolerant to Lyrica.  She denies any weakness.    UPDATE 12//13/2021  She is here for 1 year follow-up.  She is doing well on ropinirole 0.5mg  at noon, 0.5mg  at 4pm, and 3mg  at bedtime.  She continues to have some restless sensation in the afternoon, which she manages with walking around.  Symptoms are not bothersome enough to increase the dose of her medication.  Nighttime symptoms are well-controlled and she does not wake up at nighttime.  She usually drinks coffee in the morning and enjoys tea later in the day.   UPDATE 09/24/2021:  She is here for follow-up visit. She continues to take ropinirole 0.5mg  at noon, 0.5mg  at 4p,and 3mg  at bedtime; however, reports having breakthrough symptoms in the evening before her night time dose. She tries to walk around and tried taking the bedtime dose  earlier, but it makes her very sleepy.   Medications:  Current Outpatient Medications on File Prior to Visit  Medication Sig Dispense Refill   acetaminophen (TYLENOL) 325 MG tablet Take 650 mg by mouth every 6 (six) hours as needed.     aspirin EC 81 MG tablet Take 81 mg by mouth daily.     CRANBERRY EXTRACT PO Take 2 tablets by mouth daily.     FIBER ADULT GUMMIES PO Take by mouth daily.     glipiZIDE (GLUCOTROL) 5 MG tablet Take  1/2 to 1 tablet  3 x /day   with Meals for Diabetes  / Patient knows to take by mouth 270 tablet 1   lisinopril (ZESTRIL) 10 MG tablet TAKE 1 TABLET BY MOUTH DAILY FOR BLOOD PRESSURE 90 tablet 1   Multiple Vitamins-Minerals (MULTIVITAMIN PO) Take by mouth daily.     OVER THE COUNTER MEDICATION Uses OTC eye drops for dry eyes.     Phenazopyridine HCl (AZO TABS PO) Take 2 tablets by mouth daily.     VITAMIN D, CHOLECALCIFEROL, PO Take 4,000 Units by mouth daily.     ezetimibe (ZETIA) 10 MG tablet Take 1 tablet (10 mg total) by mouth daily. (Patient not taking: Reported on 09/24/2021) 30 tablet 11   No current facility-administered medications on file prior to visit.    Allergies:  Allergies  Allergen Reactions   Hctz [Hydrochlorothiazide]     Leg cramping   Metformin And Related Nausea  Only   Phenylbutazones Swelling   Tropicamide Swelling    Red /itching/watery eyes    Vital Signs:  BP (!) 146/81   Pulse 90   Ht 5' 4.5" (1.638 m)   Wt 184 lb (83.5 kg)   SpO2 98%   BMI 31.10 kg/m     Neurological Exam: MENTAL STATUS including orientation to time, place, person, and recent and remote memory is normal.  Speech is not dysarthric.  CRANIAL NERVES:  Extraocular muscles intact  MOTOR:  Motor strength is 5/5 throughout. No atrophy, fasciculations or abnormal movements.  No pronator drift.  Tone is normal.    MSRs:  Reflexes are 2+/4, except absent right Achilles  SENSORY:  Vibration intact throughout  COORDINATION/GAIT:  Gait narrow based and  stable.   Data: Lab Results  Component Value Date   FERRITIN 100 10/10/2016   Lab Results  Component Value Date   TSH 1.14 09/03/2021   Lab Results  Component Value Date   [id number] 356 04/06/2015    IMPRESSION/PLAN:  Restless leg syndrome, worsening - Continue ropinirole 0.5mg  at noon, 0.5mg  at 4p, and 3mg  at bedtime. Maximum dose for RLS. Refilled - Start gabapentin 100mg  at 6p. Titrate as tolerable. Patient to call with update in 2-3 weeks - Consider adding sinemet going forward as needed  Return to clinic in 1 year   Thank you for allowing me to participate in patient's care.  If I can answer any additional questions, I would be pleased to do so.    Sincerely,    Adeoluwa Silvers K. Posey Pronto, DO

## 2021-09-27 ENCOUNTER — Ambulatory Visit: Payer: Medicare Other | Admitting: Neurology

## 2021-10-17 ENCOUNTER — Other Ambulatory Visit: Payer: Self-pay | Admitting: Adult Health

## 2021-10-17 ENCOUNTER — Other Ambulatory Visit: Payer: Self-pay

## 2021-10-17 DIAGNOSIS — E1165 Type 2 diabetes mellitus with hyperglycemia: Secondary | ICD-10-CM

## 2021-10-17 MED ORDER — GLIPIZIDE 5 MG PO TABS: ORAL_TABLET | ORAL | 1 refills | Status: DC

## 2021-11-19 ENCOUNTER — Other Ambulatory Visit: Payer: Self-pay

## 2021-11-19 ENCOUNTER — Ambulatory Visit: Payer: Medicare Other | Admitting: Nurse Practitioner

## 2021-11-19 ENCOUNTER — Encounter: Payer: Self-pay | Admitting: Internal Medicine

## 2021-11-19 ENCOUNTER — Encounter: Payer: Self-pay | Admitting: Nurse Practitioner

## 2021-11-19 VITALS — HR 78 | Temp 97.7°F

## 2021-11-19 DIAGNOSIS — U071 COVID-19: Secondary | ICD-10-CM

## 2021-11-19 DIAGNOSIS — R6889 Other general symptoms and signs: Secondary | ICD-10-CM

## 2021-11-19 DIAGNOSIS — Z1152 Encounter for screening for COVID-19: Secondary | ICD-10-CM | POA: Diagnosis not present

## 2021-11-19 LAB — POC COVID19 BINAXNOW: SARS Coronavirus 2 Ag: POSITIVE — AB

## 2021-11-19 LAB — POCT INFLUENZA A/B
Influenza A, POC: NEGATIVE
Influenza B, POC: NEGATIVE

## 2021-11-19 MED ORDER — DEXAMETHASONE 1 MG PO TABS: ORAL_TABLET | ORAL | 0 refills | Status: DC

## 2021-11-19 MED ORDER — PROMETHAZINE-DM 6.25-15 MG/5ML PO SYRP: 5.0000 mL | ORAL_SOLUTION | Freq: Four times a day (QID) | ORAL | 1 refills | Status: DC | PRN

## 2021-11-19 MED ORDER — AZITHROMYCIN 250 MG PO TABS: ORAL_TABLET | ORAL | 1 refills | Status: DC

## 2021-11-19 MED ORDER — MOLNUPIRAVIR EUA 200MG CAPSULE: 4.0000 | ORAL_CAPSULE | Freq: Two times a day (BID) | ORAL | 0 refills | Status: AC

## 2021-11-19 NOTE — Progress Notes (Signed)
THIS ENCOUNTER IS A VIRTUAL VISIT DUE TO COVID-19 - PATIENT WAS NOT SEEN IN THE OFFICE.  PATIENT HAS CONSENTED TO VIRTUAL VISIT / TELEMEDICINE VISIT   Virtual Visit via telephone Note  I connected with  Rachel Daniel on 11/19/2021 by telephone.  I verified that I am speaking with the correct person using two identifiers.    I discussed the limitations of evaluation and management by telemedicine and the availability of in person appointments. The patient expressed understanding and agreed to proceed.  History of Present Illness:  Pulse 78    Temp 97.7 F (36.5 C)    SpO2 95%  71 y.o. patient contacted office reporting URI sx . she tested positive by Covid test in parking lot of office. OV was conducted by telephone to minimize exposure. This patient was vaccinated for covid 19, last 06/2021  Sx began 1 days ago with Non productive Cough, congestion, fever, Headache, weakness. Denies nausea, vomiting and diarrhea  Treatments tried so far: Tylenol  Exposures: Unknown   Medications  Current Outpatient Medications (Endocrine & Metabolic):    glipiZIDE (GLUCOTROL) 5 MG tablet, Take  1/2 to 1 tablet  3 x /day   with Meals for Diabetes  / Patient knows to take by mouth  Current Outpatient Medications (Cardiovascular):    lisinopril (ZESTRIL) 10 MG tablet, TAKE 1 TABLET BY MOUTH DAILY FOR BLOOD PRESSURE   ezetimibe (ZETIA) 10 MG tablet, Take 1 tablet (10 mg total) by mouth daily. (Patient not taking: Reported on 09/24/2021)   Current Outpatient Medications (Analgesics):    acetaminophen (TYLENOL) 325 MG tablet, Take 650 mg by mouth every 6 (six) hours as needed.   aspirin EC 81 MG tablet, Take 81 mg by mouth daily.   Current Outpatient Medications (Other):    CRANBERRY EXTRACT PO, Take 2 tablets by mouth daily.   FIBER ADULT GUMMIES PO, Take by mouth daily.   gabapentin (NEURONTIN) 100 MG capsule, Take 1 tablet at 6p.   Multiple Vitamins-Minerals (MULTIVITAMIN PO), Take by mouth  daily.   OVER THE COUNTER MEDICATION, Uses OTC eye drops for dry eyes.   Phenazopyridine HCl (AZO TABS PO), Take 2 tablets by mouth daily.   rOPINIRole (REQUIP) 0.5 MG tablet, Take 1 tablet at noon and 4pm   rOPINIRole (REQUIP) 3 MG tablet, TAKE 1 TABLET BY MOUTH AT BEDTIME FOR  RESTLESS  LEGS   VITAMIN D, CHOLECALCIFEROL, PO, Take 4,000 Units by mouth daily.  Allergies:  Allergies  Allergen Reactions   Hctz [Hydrochlorothiazide]     Leg cramping   Metformin And Related Nausea Only   Phenylbutazones Swelling   Tropicamide Swelling    Red /itching/watery eyes    Problem list She has Diverticulosis of large intestine; Restless leg syndrome; Hyperlipidemia associated with type 2 diabetes mellitus ([place]); Vitamin D deficiency; Essential hypertension; Diabetic neuropathy ([place]); Meralgia paresthetica of both lower extremities; Medication management; Obesity (BMI 30.0-34.9); Type 2 diabetes mellitus with stage 3a chronic kidney disease, without long-term current use of insulin ([place]); Osteopenia; and CKD stage 3 due to type 2 diabetes mellitus ([place]) on their problem list.   Social History:   reports that she has never smoked. She has been exposed to tobacco smoke. She has never used smokeless tobacco. She reports that she does not drink alcohol and does not use drugs.  Observations/Objective:  General : Well sounding patient in no apparent distress HEENT: no hoarseness, no cough for duration of visit Lungs: speaks in complete sentences, no audible wheezing,  no apparent distress Neurological: alert, oriented x 3 Psychiatric: pleasant, judgement appropriate   Assessment and Plan:  Covid 88 Rachel Daniel was seen today for acute visit.  Diagnoses and all orders for this visit:  Flu-like symptoms -     POCT Influenza A/B  Negative  Encounter for screening for COVID-19 -     POC COVID-19  COVID-19 -     molnupiravir EUA (LAGEVRIO) 200 mg CAPS capsule; Take 4 capsules (800 mg total) by mouth  2 (two) times daily for 5 days. -     dexamethasone (DECADRON) 1 MG tablet; Take 3 tabs for 3 days, 2 tabs for 3 days 1 tab for 5 days. Take with food. -     promethazine-dextromethorphan (PROMETHAZINE-DM) 6.25-15 MG/5ML syrup; Take 5 mLs by mouth 4 (four) times daily as needed for cough. -     azithromycin (ZITHROMAX) 250 MG tablet; Take 2 tablets (500 mg) on  Day 1,  followed by 1 tablet (250 mg) once daily on Days 2 through 5.    Covid 19 positive per rapid screening test in parking lot of office Risk factors include: hypertension, obesity, diabetes Symptoms are: mild Due to co morbid conditions and risk factors, discussed antivirals  Immue support reviewed  Take tylenol PRN temp 101+ Push hydration Regular ambulation or calf exercises exercises for clot prevention and 81 mg ASA unless contraindicated Sx supportive therapy suggested Follow up via mychart or telephone if needed Advised patient obtain O2 monitor; present to ED if persistently <90% or with severe dyspnea, CP, fever uncontrolled by tylenol, confusion, sudden decline Should remain in isolation until at least 5 days from onset of sx, 24-48 hours fever free without tylenol, sx such as cough are improved.      Follow Up Instructions:  I discussed the assessment and treatment plan with the patient. The patient was provided an opportunity to ask questions and all were answered. The patient agreed with the plan and demonstrated an understanding of the instructions.   The patient was advised to call back or seek an in-person evaluation if the symptoms worsen or if the condition fails to improve as anticipated.  I provided 20 minutes of non-face-to-face time during this encounter.   Magda Bernheim, NP

## 2021-11-19 NOTE — Telephone Encounter (Signed)
Can you schedule her to come this afternoon- can double book with any of the other visits  Will need flu and covid test

## 2021-12-05 ENCOUNTER — Ambulatory Visit: Payer: Medicare Other | Admitting: Adult Health

## 2021-12-12 ENCOUNTER — Other Ambulatory Visit: Payer: Self-pay

## 2021-12-12 ENCOUNTER — Encounter: Payer: Self-pay | Admitting: Adult Health

## 2021-12-12 ENCOUNTER — Ambulatory Visit (INDEPENDENT_AMBULATORY_CARE_PROVIDER_SITE_OTHER): Payer: Medicare Other | Admitting: Adult Health

## 2021-12-12 VITALS — BP 138/80 | HR 83 | Temp 97.2°F | Wt 183.0 lb

## 2021-12-12 DIAGNOSIS — E1122 Type 2 diabetes mellitus with diabetic chronic kidney disease: Secondary | ICD-10-CM | POA: Diagnosis not present

## 2021-12-12 DIAGNOSIS — E785 Hyperlipidemia, unspecified: Secondary | ICD-10-CM | POA: Diagnosis not present

## 2021-12-12 DIAGNOSIS — H6123 Impacted cerumen, bilateral: Secondary | ICD-10-CM | POA: Diagnosis not present

## 2021-12-12 DIAGNOSIS — N183 Chronic kidney disease, stage 3 unspecified: Secondary | ICD-10-CM

## 2021-12-12 DIAGNOSIS — R6889 Other general symptoms and signs: Secondary | ICD-10-CM | POA: Diagnosis not present

## 2021-12-12 DIAGNOSIS — I10 Essential (primary) hypertension: Secondary | ICD-10-CM | POA: Diagnosis not present

## 2021-12-12 DIAGNOSIS — E559 Vitamin D deficiency, unspecified: Secondary | ICD-10-CM

## 2021-12-12 DIAGNOSIS — M8580 Other specified disorders of bone density and structure, unspecified site: Secondary | ICD-10-CM

## 2021-12-12 DIAGNOSIS — E1169 Type 2 diabetes mellitus with other specified complication: Secondary | ICD-10-CM

## 2021-12-12 DIAGNOSIS — M791 Myalgia, unspecified site: Secondary | ICD-10-CM

## 2021-12-12 DIAGNOSIS — K5730 Diverticulosis of large intestine without perforation or abscess without bleeding: Secondary | ICD-10-CM

## 2021-12-12 DIAGNOSIS — Z0001 Encounter for general adult medical examination with abnormal findings: Secondary | ICD-10-CM | POA: Diagnosis not present

## 2021-12-12 DIAGNOSIS — E66811 Obesity, class 1: Secondary | ICD-10-CM

## 2021-12-12 DIAGNOSIS — G2581 Restless legs syndrome: Secondary | ICD-10-CM

## 2021-12-12 DIAGNOSIS — N1831 Chronic kidney disease, stage 3a: Secondary | ICD-10-CM | POA: Diagnosis not present

## 2021-12-12 DIAGNOSIS — Z79899 Other long term (current) drug therapy: Secondary | ICD-10-CM | POA: Diagnosis not present

## 2021-12-12 DIAGNOSIS — Z0000 Encounter for general adult medical examination without abnormal findings: Secondary | ICD-10-CM

## 2021-12-12 DIAGNOSIS — E669 Obesity, unspecified: Secondary | ICD-10-CM

## 2021-12-12 DIAGNOSIS — E1142 Type 2 diabetes mellitus with diabetic polyneuropathy: Secondary | ICD-10-CM

## 2021-12-12 MED ORDER — LISINOPRIL 20 MG PO TABS: 20.0000 mg | ORAL_TABLET | Freq: Every day | ORAL | 3 refills | Status: DC

## 2021-12-12 NOTE — Progress Notes (Signed)
AWV and FOLLOW UP  Assessment / Plan:   Annual Medicare Wellness Visit Due annually  Health maintenance reviewed  Essential hypertension, benign Increase lisinopril to 20 mg daily  Monitor blood pressure at home; call if consistently over 130/80 Continue DASH diet.   Reminder to go to the ER if any CP, SOB, nausea, dizziness, severe HA, changes vision/speech, left arm numbness and tingling and jaw pain.  Restless leg syndrome Managing at this time Continue requip    Hyperlipidemia associated with DM -adamantly declines statins;  Continue Zetia 29m, is close to LDL goal <70 - check lipids, decrease fatty foods, increase activity.  - Lipid panel   Diverticulosis of large intestine without hemorrhage Doing well at this time  increase fiber  Vitamin D deficiency At goal at recent check; continue to recommend supplementation for goal of 60-100 Defer vitamin D    Medication management Continued   Gastroesophageal reflux disease with esophagitis Continue PPI/H2 blocker, diet discussed  Diabetic poly neuropathy associated with type 2 diabetes mellitus Check feet regularly; control sugars  Controlled type 2 diabetes mellitus with stage 2 chronic kidney disease, without long-term current use of insulin (HCC) Continue medications: Glipizide 5 mg TID, hold if needed to avoid hypoglycemia with increased activity  Unable to tolerate metformin  Discussed general issues about diabetes pathophysiology and management. Education: Reviewed ABCs of diabetes management (respective goals in parentheses):  A1C (<7), blood pressure (<130/80), and cholesterol (LDL <70) Dietary recommendations Encouraged aerobic exercise.  Discussed foot care, check daily Yearly retinal exam Dental exam every 6 months Monitor blood glucose, discussed goal for patient -     COMPLETE METABOLIC PANEL WITH GFR -     Hemoglobin A1c  CKD stage 2 due to type 2 diabetes mellitus (HCC) Increase fluids, avoid  NSAIDS, monitor sugars, will monitor -     COMPLETE METABOLIC PANEL WITH GFR  Overweight (BMI 30-34) - long discussion about weight loss, diet, and exercise -recommended diet heavy in fruits and veggies and low in animal meats, cheeses, and dairy products  Bil Cerumen Impaction - stop using Qtips, irrigation used in the office without complications, use OTC drops/oil at home to prevent reoccurence  Myalgias ? R/t RLS but hasn't had CK, will check to r/o other etiology   Orders Placed This Encounter  Procedures   CBC with Differential/Platelet   COMPLETE METABOLIC PANEL WITH GFR   Magnesium   Lipid panel   TSH   Hemoglobin A1c   CK    Discussed med's effects and SE's. Screening labs and tests as requested with regular follow-up as recommended.  She declined [id number], receptive to [id number] disposition pending results if labs check today. Discussed med's effects and SE's.   Over 30 minutes of face to face interview, exam, counseling, chart review, and critical decision making was performed.    Future Appointments  Date Time Provider DepFranklin/17/2023  1:30 PM ZamBernarda CaffeyD [facility] None  05/02/2022  3:00 PM CorLiane ComberP [facility] None  09/27/2022 10:50 AM PatAlda BertholdO [facility] None  12/17/2022  3:00 PM CorLiane ComberP [facility] None    Plan:   During the course of the visit the patient was educated and counseled about appropriate screening and preventive services including:   Pneumococcal vaccine  Prevnar 13 Influenza vaccine Td vaccine Screening electrocardiogram Bone densitometry screening Colorectal cancer screening Diabetes screening Glaucoma screening Nutrition counseling  Advanced directives: requested  ______________________________________________________________________  HPI 71 3o. female  presents for a 3 month follow up and AWV. She has Diverticulosis of large intestine; Restless leg syndrome; Hyperlipidemia  associated with type 2 diabetes mellitus ([place]); Vitamin D deficiency; Essential hypertension; Diabetic neuropathy ([place]); Medication management; Obesity (BMI 30.0-34.9); Type 2 diabetes mellitus with stage 3a chronic kidney disease, without long-term current use of insulin ([place]); Osteopenia; and CKD stage 3 due to type 2 diabetes mellitus ([place]) on their problem list.  Recently early Feb 2023 had covid 19, reports mostly resolved except for improving dry occasional cough.   She had had bilateral leg swelling for years, left leg always worse than the right. No warmth, no pain, no redness. Does compression intermittently, doesn't tolerate diuretics.   She is on requip for RLS which helps, she follows with Dr. Posey Pronto, on 3 at night and 0.5 mg during the day but doesn't last. Describes restlness movements and tenderness and aching in her thighs. Topical aspercreme with benefit. She is also prescribed gabapentin 100 mg.   BMI is Body mass index is 30.93 kg/m., she has been working on diet, makes good choices, limited exercise, too hot. Does do ankle exercise.  Wt Readings from Last 3 Encounters:  12/12/21 183 lb (83 kg)  09/24/21 184 lb (83.5 kg)  09/03/21 184 lb 3.2 oz (83.6 kg)   She does not check BP at home due to always been steady, today their BP is BP: 138/80 She does not workout. She denies chest pain,  dizziness. She will have some SOB with dragging the trash can up hill in her driveway of 992 ft, no CP with it, no dizziness, nausea or fatigue.   She is not on cholesterol medication, she absolutely declines any statins due to her husband's very poor experience, she is on zetia 10 mg daily. Her cholesterol is not at goal of LDL <70. The cholesterol last visit was:   Lab Results  Component Value Date   CHOL 174 09/03/2021   HDL 42 (L) 09/03/2021   LDLCALC 103 (H) 09/03/2021   TRIG 169 (H) 09/03/2021   CHOLHDL 4.1 09/03/2021   She has been working on diet and exercise for diabetes  CKD  stage III on ACE - lisinopril 10 mg  Neuropathy Hyperlipidemia - declines statin, on zetia  Eye exam 12/2020 no retinopathy, has upcoming with Dr. Coralyn Pear  Glipizide - 1 tab TID except will skip if walking  could not tolerate the metformin farxiga was too expensive but did help with swelling in her legs denies polydipsia, polyuria and visual disturbances.  She has glucometer but doesn't check Last A1C in the office was:  Lab Results  Component Value Date   HGBA1C 7.2 (H) 05/02/2021    Lab Results  Component Value Date   EGFR 53 (L) 09/03/2021   EGFR 57 (L) 05/02/2021   Patient is on Vitamin D supplement, 4000 IU daily   Lab Results  Component Value Date   VD25OH 75 09/03/2021     Current Medications:   Current Outpatient Medications (Endocrine & Metabolic):    glipiZIDE (GLUCOTROL) 5 MG tablet, Take  1/2 to 1 tablet  3 x /day   with Meals for Diabetes  / Patient knows to take by mouth  Current Outpatient Medications (Cardiovascular):    lisinopril (ZESTRIL) 20 MG tablet, Take 1 tablet (20 mg total) by mouth daily. for blood pressure  Current Outpatient Medications (Respiratory):    promethazine-dextromethorphan (PROMETHAZINE-DM) 6.25-15 MG/5ML syrup, Take 5 mLs by mouth 4 (four) times daily as needed for  cough.  Current Outpatient Medications (Analgesics):    acetaminophen (TYLENOL) 325 MG tablet, Take 650 mg by mouth every 6 (six) hours as needed.   aspirin EC 81 MG tablet, Take 81 mg by mouth daily.   Current Outpatient Medications (Other):    CRANBERRY EXTRACT PO, Take 2 tablets by mouth daily.   FIBER ADULT GUMMIES PO, Take by mouth daily.   gabapentin (NEURONTIN) 100 MG capsule, Take 1 tablet at 6p.   Multiple Vitamins-Minerals (MULTIVITAMIN PO), Take by mouth daily.   OVER THE COUNTER MEDICATION, Uses OTC eye drops for dry eyes.   Phenazopyridine HCl (AZO TABS PO), Take 2 tablets by mouth daily.   rOPINIRole (REQUIP) 0.5 MG tablet, Take 1 tablet at noon and  4pm   rOPINIRole (REQUIP) 3 MG tablet, TAKE 1 TABLET BY MOUTH AT BEDTIME FOR  RESTLESS  LEGS   VITAMIN D, CHOLECALCIFEROL, PO, Take 4,000 Units by mouth daily.   Health Maintenance:   Immunization History  Administered Date(s) Administered   Influenza, High Dose Seasonal PF 08/01/2017, 09/22/2018, 07/20/2019   Moderna SARS-COV2 Booster Vaccination 01/12/2021   Moderna Sars-Covid-2 Vaccination 01/18/2020, 02/15/2020, 08/18/2020   PFIZER Comirnaty(Gray Top)Covid-19 Tri-Sucrose Vaccine 07/06/2021   Pneumococcal Conjugate-13 04/05/2016   Pneumococcal Polysaccharide-23 04/15/2017   Tdap 03/07/2011   Health Maintenance  Topic Date Due   COVID-19 Vaccine (5 - Booster for Moderna series) 08/31/2021   HEMOGLOBIN A1C  11/02/2021   Zoster Vaccines- Shingrix (1 of 2) 05/02/2022 (Originally 10/13/1970)   TETANUS/TDAP  12/13/2022 (Originally 07/03/2021)   OPHTHALMOLOGY EXAM  12/20/2021   FOOT EXAM  05/02/2022   DEXA SCAN  08/10/2022   MAMMOGRAM  08/14/2022   COLONOSCOPY (Pts 45-32yr Insurance coverage will need to be confirmed)  07/12/2026   Pneumonia Vaccine 71 Years old  Completed   INFLUENZA VACCINE  Completed   Hepatitis C Screening  Completed   HPV VACCINES  Aged Out   Tetanus: 2012 - will boost PRN due to cost Shingrix: declines  Pap: 2016 negative, DONE MGM:  annually at breast center  DEXA: 07/2020 R fem T -1.7, osteopenia, will follow up in 2-3 years  Colonoscopy: 06/2016, Dr. KDeatra Ina 10 years, family hx of colon cancer EGD: N/A  Eye exam: Dr. ZCoralyn Pear has upcoming  Dentist: Dr. RHarrington Challengerq 6 months 2022  Patient Care Team: MUnk Pinto MD as PCP - General (Internal Medicine) MUnk Pinto MD as PCP - Internal Medicine (Internal Medicine) DPhylliss Bob MD as Consulting Physician (Orthopedic Surgery) PAlda Berthold DO as Consulting Physician (Neurology)   Allergies:  Allergies  Allergen Reactions   Hctz [Hydrochlorothiazide]     Leg cramping   Metformin And  Related Nausea Only   Phenylbutazones Swelling   Tropicamide Swelling    Red /itching/watery eyes   Medical History:  Past Medical History:  Diagnosis Date   Allergy    otc med prn   Arthritis    hips, lower back   Cataract    bilateral   Chronic kidney disease    stage 3 per patient    Diet-controlled type 2 diabetes mellitus [place]    Diet controlled, no meds   Diverticulosis    Dribbling urine    EKG abnormalities    pt states she had a BBB on her last EKG, no problems   Essential hypertension, benign 08/19/2013   HPV in female    Hyperlipidemia    diet controlled, no med   Restless leg syndrome    Snoring  Vitamin D deficiency    Surgical History:  She  has a past surgical history that includes Vaginal hysterectomy (1992); Tubal ligation (1981); Ovarian cyst removal (1981); Lumbar laminectomy/decompression microdiscectomy (05/07/2012); Colonoscopy (2012); and Breast biopsy (Left, 1969).  Family History:  Her family history includes Atrial fibrillation in her brother, brother, sister, sister, and sister; Cataracts in her mother; Colon cancer (age of onset: 49) in her mother; Colon polyps in her brother and sister; Diabetes in her mother, paternal grandfather, sister, and sister; Heart failure in her mother; Heart failure (age of onset: 1) in her father; Hypertension in her mother; Kidney disease in her father; Stroke in her mother; Stroke (age of onset: 66) in her sister.   Social History:  She  reports that she has never smoked. She has been exposed to tobacco smoke. She has never used smokeless tobacco. She reports that she does not drink alcohol and does not use drugs.   MEDICARE WELLNESS OBJECTIVES: Physical activity: Current Exercise Habits: The patient does not participate in regular exercise at present, Exercise limited by: neurologic condition(s) Cardiac risk factors: Cardiac Risk Factors include: advanced age (>59mn, >>47women);dyslipidemia;hypertension;obesity  (BMI >30kg/m2);sedentary lifestyle;diabetes mellitus Depression/mood screen:   Depression screen [facility] 12/12/2021  Decreased Interest 0  Down, Depressed, Hopeless 0  PHQ - 2 Score 0    ADLs:  In your present state of health, do you have any difficulty performing the following activities: 12/12/2021 09/02/2021  Hearing? N N  Vision? N N  Difficulty concentrating or making decisions? N N  Walking or climbing stairs? N N  Dressing or bathing? N N  Doing errands, shopping? N N  Some recent data might be hidden     Cognitive Testing  Alert? Yes  Normal Appearance?Yes  Oriented to person? Yes  Place? Yes   Time? Yes  Recall of three objects?  Yes  Can perform simple calculations? Yes  Displays appropriate judgment?Yes  Can read the correct time from a watch face?Yes  EOL planning: Does Patient Have a Medical Advance Directive?: Yes Type of Advance Directive: Healthcare Power of Attorney, Living will Does patient want to make changes to medical advance directive?: No - Patient declined Copy of [place] Chart?: Yes - validated most recent copy scanned in chart (See row information)     Review of Systems  Constitutional: Negative.  Negative for malaise/fatigue and weight loss.  HENT: Negative.  Negative for hearing loss and tinnitus.   Eyes: Negative.  Negative for blurred vision and double vision.  Respiratory:  Negative for cough, hemoptysis, sputum production, shortness of breath and wheezing.   Cardiovascular: Negative.  Negative for chest pain, palpitations, orthopnea, claudication, leg swelling and PND.  Gastrointestinal: Negative.  Negative for abdominal pain, blood in stool, constipation, diarrhea, heartburn, melena, nausea and vomiting.  Genitourinary: Negative.   Musculoskeletal:  Positive for myalgias (bil thighs). Negative for back pain, falls, joint pain and neck pain.  Skin:  Negative for itching and rash.  Neurological:  Negative for dizziness,  tingling, tremors, sensory change, weakness and headaches.  Endo/Heme/Allergies:  Negative for polydipsia.  Psychiatric/Behavioral: Negative.  Negative for depression, memory loss, substance abuse and suicidal ideas. The patient is not nervous/anxious and does not have insomnia.   All other systems reviewed and are negative.  Physical Exam: Estimated body mass index is 30.93 kg/m as calculated from the following:   Height as of 09/24/21: 5' 4.5" (1.638 m).   Weight as of this encounter: 183 lb (  83 kg). BP 138/80    Pulse 83    Temp (!) 97.2 F (36.2 C)    Wt 183 lb (83 kg)    SpO2 99%    BMI 30.93 kg/m    General Appearance: Well nourished, in no apparent distress. Eyes: PERRLA, EOMs, conjunctiva no swelling or erythema Sinuses: No Frontal/maxillary tenderness ENT/Mouth: Ext aud canals bil obstructed by dru wax.  Good dentition. No erythema, swelling, or exudate on post pharynx. Tonsils not swollen or erythematous. Hearing normal.  Neck: Supple, thyroid normal. No bruits Respiratory: Respiratory effort normal, BS equal bilaterally without rales, rhonchi, wheezing or stridor. Cardio: RRR without murmurs, rubs or gallops. Brisk peripheral pulses without edema.  Chest: symmetric, with normal excursions and percussion. Breasts: defer, getting annual mammograms, no concerns Abdomen: Soft, +BS, Non tender, no guarding, rebound, hernias, masses, or organomegaly. .  Lymphatics: Non tender without lymphadenopathy.  Musculoskeletal: Full ROM all peripheral extremities,5/5 strength, and normal gait.  Skin: + varicose veins bilateral legs. Warm, dry without rashes, lesions, ecchymosis.  Neuro: Cranial nerves intact, reflexes equal bilaterally. Normal muscle tone, no cerebellar symptoms. Sensation intact Psych: Awake and oriented X 3, normal affect, Insight and Judgment appropriate.    Medicare Attestation I have personally reviewed: The patient's medical and social history Their use of alcohol,  tobacco or illicit drugs Their current medications and supplements The patient's functional ability including ADLs,fall risks, home safety risks, cognitive, and hearing and visual impairment Diet and physical activities Evidence for depression or mood disorders  The patient's weight, height, BMI, and visual acuity have been recorded in the chart.  I have made referrals, counseling, and provided education to the patient based on review of the above and I have provided the patient with a written personalized care plan for preventive services.     Izora Ribas, NP 3:16 PM [facility]

## 2021-12-13 LAB — MAGNESIUM: Magnesium: 2.2 mg/dL (ref 1.5–2.5)

## 2021-12-13 LAB — CBC WITH DIFFERENTIAL/PLATELET
Absolute Monocytes: 340 cells/uL (ref 200–950)
Basophils Absolute: 38 cells/uL (ref 0–200)
Basophils Relative: 0.6 %
Eosinophils Absolute: 183 cells/uL (ref 15–500)
Eosinophils Relative: 2.9 %
HCT: 41.6 % (ref 35.0–45.0)
Hemoglobin: 13.7 g/dL (ref 11.7–15.5)
Lymphs Abs: 2003 cells/uL (ref 850–3900)
MCH: 29.2 pg (ref 27.0–33.0)
MCHC: 32.9 g/dL (ref 32.0–36.0)
MCV: 88.7 fL (ref 80.0–100.0)
MPV: 10.1 fL (ref 7.5–12.5)
Monocytes Relative: 5.4 %
Neutro Abs: 3736 cells/uL (ref 1500–7800)
Neutrophils Relative %: 59.3 %
Platelets: 189 10*3/uL (ref 140–400)
RBC: 4.69 10*6/uL (ref 3.80–5.10)
RDW: 12.6 % (ref 11.0–15.0)
Total Lymphocyte: 31.8 %
WBC: 6.3 10*3/uL (ref 3.8–10.8)

## 2021-12-13 LAB — COMPLETE METABOLIC PANEL WITH GFR
AG Ratio: 1.3 (calc) (ref 1.0–2.5)
ALT: 18 U/L (ref 6–29)
AST: 19 U/L (ref 10–35)
Albumin: 4.3 g/dL (ref 3.6–5.1)
Alkaline phosphatase (APISO): 73 U/L (ref 37–153)
BUN/Creatinine Ratio: 13 (calc) (ref 6–22)
BUN: 15 mg/dL (ref 7–25)
CO2: 27 mmol/L (ref 20–32)
Calcium: 9.4 mg/dL (ref 8.6–10.4)
Chloride: 104 mmol/L (ref 98–110)
Creat: 1.16 mg/dL — ABNORMAL HIGH (ref 0.60–1.00)
Globulin: 3.3 g/dL (calc) (ref 1.9–3.7)
Glucose, Bld: 158 mg/dL — ABNORMAL HIGH (ref 65–99)
Potassium: 4.2 mmol/L (ref 3.5–5.3)
Sodium: 138 mmol/L (ref 135–146)
Total Bilirubin: 0.3 mg/dL (ref 0.2–1.2)
Total Protein: 7.6 g/dL (ref 6.1–8.1)
eGFR: 50 mL/min/{1.73_m2} — ABNORMAL LOW (ref >=60)

## 2021-12-13 LAB — LIPID PANEL
Cholesterol: 167 mg/dL (ref <200)
HDL: 40 mg/dL — ABNORMAL LOW (ref >=50)
LDL Cholesterol (Calc): 104 mg/dL (calc) — ABNORMAL HIGH
Non-HDL Cholesterol (Calc): 127 mg/dL (calc) (ref <130)
Total CHOL/HDL Ratio: 4.2 (calc) (ref <5.0)
Triglycerides: 132 mg/dL (ref <150)

## 2021-12-13 LAB — HEMOGLOBIN A1C
Hgb A1c MFr Bld: 7.5 % of total Hgb — ABNORMAL HIGH (ref <5.7)
Mean Plasma Glucose: 169 mg/dL
eAG (mmol/L): 9.3 mmol/L

## 2021-12-13 LAB — CK: Total CK: 122 U/L (ref 29–143)

## 2021-12-13 LAB — TSH: TSH: 1.1 mIU/L (ref 0.40–4.50)

## 2021-12-19 NOTE — Progress Notes (Shared)
[facility] Note  12/28/2021     CHIEF COMPLAINT Patient presents for No chief complaint on file.   HISTORY OF PRESENT ILLNESS: Rachel Daniel is a 71 y.o. female who presents to the clinic today for:    Pt states her last A1c was 7.3 on 02.22.22, she states her dr seems happy with this number and is not not changing any of her medications, pt is complaining of intermittent blurriness today  Referring physician: Unk Pinto, MD [address] [place],  [place] [address]  HISTORICAL INFORMATION:   Selected notes from the [facility] Referred by A. Corbett, NP (internal med) for DM exam;   LEE- unknown - saw Dr. Zigmund Daniel on 08.07.17, BCVA at that time OD: 20/20 OS: 20/20 Ocular Hx- cataract OU PMH- DM, arthritis, CKD, HTN    CURRENT MEDICATIONS: No current outpatient medications on file. (Ophthalmic Drugs)   No current facility-administered medications for this visit. (Ophthalmic Drugs)   Current Outpatient Medications (Other)  Medication Sig   acetaminophen (TYLENOL) 325 MG tablet Take 650 mg by mouth every 6 (six) hours as needed.   aspirin EC 81 MG tablet Take 81 mg by mouth daily.   CRANBERRY EXTRACT PO Take 2 tablets by mouth daily.   FIBER ADULT GUMMIES PO Take by mouth daily.   gabapentin (NEURONTIN) 100 MG capsule Take 1 tablet at 6p.   glipiZIDE (GLUCOTROL) 5 MG tablet Take  1/2 to 1 tablet  3 x /day   with Meals for Diabetes  / Patient knows to take by mouth   lisinopril (ZESTRIL) 20 MG tablet Take 1 tablet (20 mg total) by mouth daily. for blood pressure   Multiple Vitamins-Minerals (MULTIVITAMIN PO) Take by mouth daily.   OVER THE COUNTER MEDICATION Uses OTC eye drops for dry eyes.   Phenazopyridine HCl (AZO TABS PO) Take 2 tablets by mouth daily.   promethazine-dextromethorphan (PROMETHAZINE-DM) 6.25-15 MG/5ML syrup Take 5 mLs by mouth 4 (four) times daily as needed for cough.   rOPINIRole (REQUIP) 0.5  MG tablet Take 1 tablet at noon and 4pm   rOPINIRole (REQUIP) 3 MG tablet TAKE 1 TABLET BY MOUTH AT BEDTIME FOR  RESTLESS  LEGS   VITAMIN D, CHOLECALCIFEROL, PO Take 4,000 Units by mouth daily.   No current facility-administered medications for this visit. (Other)      REVIEW OF SYSTEMS:     ALLERGIES Allergies  Allergen Reactions   Hctz [Hydrochlorothiazide]     Leg cramping   Metformin And Related Nausea Only   Phenylbutazones Swelling   Tropicamide Swelling    Red /itching/watery eyes    PAST MEDICAL HISTORY Past Medical History:  Diagnosis Date   Allergy    otc med prn   Arthritis    hips, lower back   Cataract    bilateral   Chronic kidney disease    stage 3 per patient    Diet-controlled type 2 diabetes mellitus ([place])    Diet controlled, no meds   Diverticulosis    Dribbling urine    EKG abnormalities    pt states she had a BBB on her last EKG, no problems   Essential hypertension, benign 08/19/2013   HPV in female    Hyperlipidemia    diet controlled, no med   Restless leg syndrome    Snoring    Vitamin D deficiency    Past Surgical History:  Procedure Laterality Date   BREAST BIOPSY Left 1969  left lumpectomy - benign   COLONOSCOPY  2012   kaplan-polyps, tics   LUMBAR LAMINECTOMY/DECOMPRESSION MICRODISCECTOMY  05/07/2012   Procedure: LUMBAR LAMINECTOMY/DECOMPRESSION MICRODISCECTOMY;  Surgeon: Sinclair Ship, MD;  Location: [facility];  Service: Orthopedics;  Laterality: Bilateral;  Lumbar 4-5 decompression   OVARIAN CYST REMOVAL  1981   right   TUBAL LIGATION  1981   VAGINAL HYSTERECTOMY  1992    FAMILY HISTORY Family History  Problem Relation Age of Onset   Hypertension Mother    Diabetes Mother    Stroke Mother    Heart failure Mother    Colon cancer Mother 88   Cataracts Mother    Heart failure Father 38   Kidney disease Father    Stroke Sister 51   Colon polyps Sister    Atrial fibrillation Sister    Diabetes Sister    Atrial  fibrillation Sister    Diabetes Sister    Atrial fibrillation Sister    Colon polyps Brother    Atrial fibrillation Brother    Atrial fibrillation Brother    Diabetes Paternal Grandfather    Stomach cancer Neg Hx    Esophageal cancer Neg Hx    Rectal cancer Neg Hx    Amblyopia Neg Hx    Blindness Neg Hx    Glaucoma Neg Hx    Macular degeneration Neg Hx    Retinal detachment Neg Hx    Strabismus Neg Hx    Retinitis pigmentosa Neg Hx    Breast cancer Neg Hx     SOCIAL HISTORY Social History   Tobacco Use   Smoking status: Never    Passive exposure: Past   Smokeless tobacco: Never  Vaping Use   Vaping Use: Never used  Substance Use Topics   Alcohol use: No    Alcohol/week: 0.0 standard drinks   Drug use: No         OPHTHALMIC EXAM:  Not recorded     IMAGING AND PROCEDURES  Imaging and Procedures for 11/27/17            ASSESSMENT/PLAN:    ICD-10-CM   1. Diabetes mellitus type 2 without retinopathy ([place])  E11.9     2. Posterior vitreous detachment of left eye  H43.812     3. Combined forms of age-related cataract of both eyes  H25.813     4. Dry eyes  H04.123        1. Diabetes mellitus, type 2 without retinopathy -- stable  - A1c:7.3 on 02.22.22  - The incidence, risk factors for progression, natural history and treatment options for diabetic retinopathy  were discussed with patient.    - The need for close monitoring of blood glucose, blood pressure, and serum lipids, avoiding cigarette or any type of tobacco, and the need for long term follow up was also discussed with patient.   - f/u in 1-2 years, sooner prn  2. PVD OS  Long-standing, floater OS -- unchanged  Discussed findings and prognosis  No RT or RD on 360 peripheral exam  Reviewed s/s of RT/RD  Strict return precautions for any such RT/RD symptoms  3. Combined form age-related cataract OU   - The symptoms of cataract, surgical options, and treatments and risks were discussed  with patient.  - discussed diagnosis and progression  - progressing but not yet visually significant  - monitor   4,5. Dry Eyes / MGD OU  - recommend artificial tears and lubricating ointment as needed   - recommend  hot compresses and lid scrubs    Ophthalmic Meds Ordered this visit:  No orders of the defined types were placed in this encounter.      No follow-ups on file.  There are no Patient Instructions on file for this visit.   Explained the diagnoses, plan, and follow up with the patient and they expressed understanding.  Patient expressed understanding of the importance of proper follow up care.   This document serves as a record of services personally performed by Gardiner Sleeper, MD, PhD. It was created on their behalf by Leonie Douglas, an ophthalmic technician. The creation of this record is the provider's dictation and/or activities during the visit.    Electronically signed by: Leonie Douglas COA, 12/19/21  10:42 AM   Gardiner Sleeper, M.D., Ph.D. Diseases & Surgery of the Retina and Vitreous [facility]: M myopia (nearsighted); A astigmatism; H hyperopia (farsighted); P presbyopia; Mrx spectacle prescription;  CTL contact lenses; OD right eye; OS left eye; OU both eyes  XT exotropia; ET esotropia; PEK punctate epithelial keratitis; PEE punctate epithelial erosions; DES dry eye syndrome; MGD meibomian gland dysfunction; ATs artificial tears; PFAT's preservative free artificial tears; [place] nuclear sclerotic cataract; PSC posterior subcapsular cataract; ERM epi-retinal membrane; PVD posterior vitreous detachment; RD retinal detachment; DM diabetes mellitus; DR diabetic retinopathy; NPDR non-proliferative diabetic retinopathy; PDR proliferative diabetic retinopathy; CSME clinically significant macular edema; DME diabetic macular edema; dbh dot blot hemorrhages; CWS cotton wool spot; POAG primary open angle glaucoma; C/D cup-to-disc ratio;  HVF humphrey visual field; GVF goldmann visual field; OCT optical coherence tomography; IOP intraocular pressure; BRVO Branch retinal vein occlusion; CRVO central retinal vein occlusion; CRAO central retinal artery occlusion; BRAO branch retinal artery occlusion; RT retinal tear; SB scleral buckle; PPV pars plana vitrectomy; VH Vitreous hemorrhage; PRP panretinal laser photocoagulation; IVK intravitreal kenalog; VMT vitreomacular traction; MH Macular hole;  NVD neovascularization of the disc; NVE neovascularization elsewhere; AREDS age related eye disease study; ARMD age related macular degeneration; POAG primary open angle glaucoma; EBMD epithelial/anterior basement membrane dystrophy; ACIOL anterior chamber intraocular lens; IOL intraocular lens; PCIOL posterior chamber intraocular lens; Phaco/IOL phacoemulsification with intraocular lens placement; [place] photorefractive keratectomy; LASIK laser assisted in situ keratomileusis; HTN hypertension; DM diabetes mellitus; COPD chronic obstructive pulmonary disease

## 2021-12-21 ENCOUNTER — Encounter (INDEPENDENT_AMBULATORY_CARE_PROVIDER_SITE_OTHER): Payer: Medicare Other | Admitting: Ophthalmology

## 2021-12-27 ENCOUNTER — Other Ambulatory Visit: Payer: Self-pay | Admitting: Neurology

## 2021-12-28 ENCOUNTER — Ambulatory Visit (INDEPENDENT_AMBULATORY_CARE_PROVIDER_SITE_OTHER): Payer: Medicare Other | Admitting: Ophthalmology

## 2021-12-28 ENCOUNTER — Encounter (INDEPENDENT_AMBULATORY_CARE_PROVIDER_SITE_OTHER): Payer: Self-pay | Admitting: Ophthalmology

## 2021-12-28 ENCOUNTER — Other Ambulatory Visit: Payer: Self-pay

## 2021-12-28 DIAGNOSIS — H25813 Combined forms of age-related cataract, bilateral: Secondary | ICD-10-CM

## 2021-12-28 DIAGNOSIS — E119 Type 2 diabetes mellitus without complications: Secondary | ICD-10-CM

## 2021-12-28 DIAGNOSIS — H43812 Vitreous degeneration, left eye: Secondary | ICD-10-CM | POA: Diagnosis not present

## 2021-12-28 DIAGNOSIS — H04123 Dry eye syndrome of bilateral lacrimal glands: Secondary | ICD-10-CM | POA: Diagnosis not present

## 2021-12-29 ENCOUNTER — Encounter (INDEPENDENT_AMBULATORY_CARE_PROVIDER_SITE_OTHER): Payer: Self-pay | Admitting: Ophthalmology

## 2022-02-28 ENCOUNTER — Other Ambulatory Visit: Payer: Self-pay | Admitting: Neurology

## 2022-04-01 ENCOUNTER — Other Ambulatory Visit: Payer: Self-pay | Admitting: Neurology

## 2022-04-15 ENCOUNTER — Other Ambulatory Visit: Payer: Self-pay | Admitting: Internal Medicine

## 2022-04-15 DIAGNOSIS — E1165 Type 2 diabetes mellitus with hyperglycemia: Secondary | ICD-10-CM

## 2022-05-01 NOTE — Progress Notes (Signed)
CPE  Assessment / Plan:   Encounter for Annual Physical Exam with abnormal findings Due annually  Health Maintenance reviewed Healthy lifestyle reviewed and goals set  Essential hypertension, benign Continue current medications, labile  Monitor blood pressure at home; call if consistently over 130/80 Continue DASH diet.   Reminder to go to the ER if any CP, SOB, nausea, dizziness, severe HA, changes vision/speech, left arm numbness and tingling and jaw pain. - EKG  Restless leg syndrome Continue requip and Gabapentin Continue to follow with neurology   Hyperlipidemia associated with DM -adamantly declines statins;  Continue Zetia '10mg'$ , is close to LDL goal <70 - check lipids, decrease fatty foods, increase activity.  - Lipid panel   Diverticulosis of large intestine without hemorrhage Doing well at this time  increase fiber  Right Flank Pain - Urine routine with microscopic - urine culture - Stat CT to rule out kidney stone.  Vitamin D deficiency At goal at recent check; continue to recommend supplementation for goal of 60-100 Defer vitamin D level to next OV to avoid bill    Medication management Continued   Gastroesophageal reflux disease with esophagitis Continue PPI/H2 blocker, diet discussed  Diabetic poly neuropathy associated with type 2 diabetes mellitus Check feet regularly; control sugars  Controlled type 2 diabetes mellitus with stage 2 chronic kidney disease, without long-term current use of insulin (HCC) Continue medications: Glipizide 5 mg TID Unable to tolerate metformin  Discussed general issues about diabetes pathophysiology and management. Education: Reviewed 'ABCs' of diabetes management (respective goals in parentheses):  A1C (<7), blood pressure (<130/80), and cholesterol (LDL <70) Dietary recommendations Encouraged aerobic exercise.  Discussed foot care, check daily Yearly retinal exam Dental exam every 6 months Monitor blood glucose,  discussed goal for patient -     COMPLETE METABOLIC PANEL WITH GFR -     Hemoglobin A1c  CKD stage 2 due to type 2 diabetes mellitus (HCC) Increase fluids, avoid NSAIDS, monitor sugars, will monitor -     COMPLETE METABOLIC PANEL WITH GFR -     UA/Micro  Obesity (BMI 30-34) - long discussion about weight loss, diet, and exercise -recommended diet heavy in fruits and veggies and low in animal meats, cheeses, and dairy products     Discussed med's effects and SE's. Screening labs and tests as requested with regular follow-up as recommended.   Further disposition pending results if labs check today. Discussed med's effects and SE's.   Over 30 minutes of face to face interview, exam, counseling, chart review, and critical decision making was performed.    Future Appointments  Date Time Provider [place]  08/06/2022 10:30 AM Unk Pinto, MD [facility] None  09/27/2022 10:50 AM Narda Amber K, DO [facility] None  12/17/2022  3:00 PM Darrol Jump, NP [facility] None  12/30/2022  2:00 PM Bernarda Caffey, MD [facility] None  05/05/2023 10:00 AM Alycia Rossetti, NP [facility] None    Plan:   During the course of the visit the patient was educated and counseled about appropriate screening and preventive services including:   Pneumococcal vaccine  Prevnar 13 Influenza vaccine Td vaccine Screening electrocardiogram Bone densitometry screening Colorectal cancer screening Diabetes screening Glaucoma screening Nutrition counseling  Advanced directives: requested  ______________________________________________________________________  HPI 71 y.o. female  presents for a 3 month follow up and CPE. She has Diverticulosis of large intestine; Restless leg syndrome; Hyperlipidemia associated with type 2 diabetes mellitus ([place]); Vitamin D deficiency; Essential hypertension; Diabetic neuropathy ([place]); Medication management; Obesity (BMI 30.0-34.9); Type  2 diabetes mellitus with  stage 3a chronic kidney disease, without long-term current use of insulin ([place]); Osteopenia; and CKD stage 3 due to type 2 diabetes mellitus ([place]) on their problem list.   She retired May 2nd 2018, states she is doing well.  Widowed, 2 grown children, 2 grandsons are in their 39s.  She is having pain in right lower back which has been present for 2 days.  Describes pain as 7/10 that is a persistent ache.  She has no history of kidney stones.  No increased frequency of urination, dysuria or hematuria. Has tried Tylenol but it is not helping the pain.   She had had bilateral leg swelling for years, left leg always worse than the right. No warmth, no pain, no redness. Does compression intermittently, doesn't tolerate diuretics.   She is on requip for RLS which helps, she follows with Dr. Posey Pronto, on 3 at night and occ 0.'5mg'$  at lunch and dinner.  Also using gabapentin 100 mg at 6 pm. Followed by Dr. Posey Pronto, neurology  BMI is Body mass index is 30.19 kg/m., she has been working on diet, makes good choices, limited exercise, too hot. Does do ankle exercise.  Wt Readings from Last 3 Encounters:  05/02/22 181 lb 6.4 oz (82.3 kg)  12/12/21 183 lb (83 kg)  09/24/21 184 lb (83.5 kg)   She does not check BP at home due to always been steady, she is currently on lisinopril 20 mg qd, today their BP is BP: 130/72  BP Readings from Last 3 Encounters:  05/02/22 130/72  12/12/21 138/80  09/24/21 (!) 146/81  She does not workout. She denies chest pain,  dizziness. She will have some SOB with dragging the trash can up hill in her driveway of 332 ft, no CP with it, no dizziness, nausea or fatigue.   She is not on cholesterol medication, she absolutely declines any statins due to her husband's very poor experience, she is on zetia 10 mg daily. Her cholesterol is not at goal of LDL <70. The cholesterol last visit was:   Lab Results  Component Value Date   CHOL 167 12/12/2021   HDL 40 (L) 12/12/2021   LDLCALC 104  (H) 12/12/2021   TRIG 132 12/12/2021   CHOLHDL 4.2 12/12/2021   She has been working on diet and exercise for diabetes  CKD stage III on ACE Neuropathy Hyperlipidemia - declines statin, on zetia  Eye exam 12/2020 no retinopathy Glipizide - 1 /2 tab at breakfast and 1 at lunch or dinner could not tolerate the metformin farxiga was too expensive but did help with swelling in her legs denies polydipsia, polyuria and visual disturbances.  She has glucometer , running 140-150 Last A1C in the office was:  Lab Results  Component Value Date   HGBA1C 7.5 (H) 12/12/2021   Lab Results  Component Value Date   GFRNONAA 50 (L) 12/05/2020   Patient is on Vitamin D supplement, 4000 IU daily   Lab Results  Component Value Date   VD25OH 75 09/03/2021     Current Medications:   Current Outpatient Medications (Endocrine & Metabolic):    glipiZIDE (GLUCOTROL) 5 MG tablet, TAKE 1/2 TO 1 TABLET BY MOUTH THREE TIMES DAILY WITH MEALS( FOR DIABETES)  Current Outpatient Medications (Cardiovascular):    lisinopril (ZESTRIL) 20 MG tablet, Take 1 tablet (20 mg total) by mouth daily. for blood pressure  Current Outpatient Medications (Respiratory):    promethazine-dextromethorphan (PROMETHAZINE-DM) 6.25-15 MG/5ML syrup, Take 5 mLs by  mouth 4 (four) times daily as needed for cough. (Patient not taking: Reported on 05/02/2022)  Current Outpatient Medications (Analgesics):    acetaminophen (TYLENOL) 325 MG tablet, Take 650 mg by mouth every 6 (six) hours as needed.   aspirin EC 81 MG tablet, Take 81 mg by mouth daily.   Current Outpatient Medications (Other):    CRANBERRY EXTRACT PO, Take 2 tablets by mouth daily.   FIBER ADULT GUMMIES PO, Take by mouth daily.   gabapentin (NEURONTIN) 100 MG capsule, TAKE 1 CAPSULE BY MOUTH DAILY AT 6 PM   Multiple Vitamins-Minerals (MULTIVITAMIN PO), Take by mouth daily.   OVER THE COUNTER MEDICATION, Uses OTC eye drops for dry eyes.   Phenazopyridine HCl (AZO TABS  PO), Take 2 tablets by mouth daily.   rOPINIRole (REQUIP) 0.5 MG tablet, Take 1 tablet at noon and 4pm   rOPINIRole (REQUIP) 3 MG tablet, TAKE 1 TABLET BY MOUTH AT BEDTIME FOR  RESTLESS  LEGS   VITAMIN D, CHOLECALCIFEROL, PO, Take 4,000 Units by mouth daily.   Health Maintenance:   Immunization History  Administered Date(s) Administered   Influenza, High Dose Seasonal PF 08/01/2017, 09/22/2018, 07/20/2019   Moderna SARS-COV2 Booster Vaccination 01/12/2021   Moderna Sars-Covid-2 Vaccination 01/18/2020, 02/15/2020, 08/18/2020   PFIZER Comirnaty([place])Covid-19 Tri-[place] Vaccine 07/06/2021   Pneumococcal Conjugate-13 04/05/2016   Pneumococcal Polysaccharide-23 04/15/2017   Tdap 03/07/2011   Tetanus: 2012 - will boost PRN due to cost Pneumovax: 2018 Prevnar 13: 2017 Flu vaccine: 2019  Shingrix: declines Covid 19: 2/2 + 2   Pap: 2016 negative, DONE MGM: 08/2021  DEXA: 2021 normal, declines for now  Colonoscopy: 06/2016, Dr. Deatra Ina, 10 years, family hx of colon cancer EGD: N/A  Eye exam: Dr. Coralyn Pear  Dentist: Dr. Harrington Challenger q 6 months 2022  Patient Care Team: Unk Pinto, MD as PCP - General (Internal Medicine) Unk Pinto, MD as PCP - Internal Medicine (Internal Medicine) Phylliss Bob, MD as Consulting Physician (Orthopedic Surgery) Alda Berthold, DO as Consulting Physician (Neurology)   Allergies:  Allergies  Allergen Reactions   Hctz [Hydrochlorothiazide]     Leg cramping   Metformin And Related Nausea Only   Phenylbutazones Swelling   Tropicamide Swelling    Red /itching/watery eyes   Medical History:  Past Medical History:  Diagnosis Date   Allergy    otc med prn   Arthritis    hips, lower back   Cataract    bilateral   Chronic kidney disease    stage 3 per patient    Diet-controlled type 2 diabetes mellitus ([place])    Diet controlled, no meds   Diverticulosis    Dribbling urine    EKG abnormalities    pt states she had a BBB on her last EKG,  no problems   Essential hypertension, benign 08/19/2013   HPV in female    Hyperlipidemia    diet controlled, no med   Restless leg syndrome    Snoring    Vitamin D deficiency    Surgical History:  She  has a past surgical history that includes Vaginal hysterectomy (1992); Tubal ligation (1981); Ovarian cyst removal (1981); Lumbar laminectomy/decompression microdiscectomy (05/07/2012); Colonoscopy (2012); and Breast biopsy (Left, 1969).  Family History:  Her family history includes Atrial fibrillation in her brother, brother, sister, sister, and sister; Cataracts in her mother; Colon cancer (age of onset: 10) in her mother; Colon polyps in her brother and sister; Diabetes in her mother, paternal grandfather, sister, and sister; Heart failure in  her mother; Heart failure (age of onset: 34) in her father; Hypertension in her mother; Kidney disease in her father; Stroke in her mother; Stroke (age of onset: 76) in her sister.   Social History:  She  reports that she has never smoked. She has been exposed to tobacco smoke. She has never used smokeless tobacco. She reports that she does not drink alcohol and does not use drugs.   Review of Systems  Constitutional: Negative.  Negative for malaise/fatigue and weight loss.  HENT: Negative.  Negative for hearing loss and tinnitus.   Eyes: Negative.  Negative for blurred vision and double vision.  Respiratory:  Negative for cough, hemoptysis, sputum production, shortness of breath and wheezing.   Cardiovascular: Negative.  Negative for chest pain, palpitations, orthopnea, claudication, leg swelling and PND.  Gastrointestinal: Negative.  Negative for abdominal pain, blood in stool, constipation, diarrhea, heartburn, melena, nausea and vomiting.  Genitourinary:  Positive for flank pain (right).  Musculoskeletal:  Negative for back pain, falls, joint pain, myalgias and neck pain.  Skin:  Negative for itching and rash.  Neurological:  Positive for  tingling (right foot). Negative for dizziness, tremors, sensory change, weakness and headaches.       Restless leg symptoms  Endo/Heme/Allergies:  Negative for polydipsia.  Psychiatric/Behavioral: Negative.  Negative for depression, memory loss, substance abuse and suicidal ideas. The patient is not nervous/anxious and does not have insomnia.   All other systems reviewed and are negative.   Physical Exam: Estimated body mass index is 30.19 kg/m as calculated from the following:   Height as of this encounter: '5\' 5"'$  (1.651 m).   Weight as of this encounter: 181 lb 6.4 oz (82.3 kg). BP 130/72   Pulse 78   Temp 97.7 F (36.5 C)   Ht '5\' 5"'$  (1.651 m)   Wt 181 lb 6.4 oz (82.3 kg)   SpO2 97%   BMI 30.19 kg/m    General Appearance: Well nourished, in no apparent distress. Eyes: PERRLA, EOMs, conjunctiva no swelling or erythema, normal fundi and vessels. Sinuses: No Frontal/maxillary tenderness ENT/Mouth: Ext aud canals clear, normal light reflex with TMs without erythema, bulging.  Good dentition. No erythema, swelling, or exudate on post pharynx. Tonsils not swollen or erythematous. Hearing normal.  Neck: Supple, thyroid normal. No bruits Respiratory: Respiratory effort normal, BS equal bilaterally without rales, rhonchi, wheezing or stridor. Cardio: RRR without murmurs, rubs or gallops. Brisk peripheral pulses without edema.  Chest: symmetric, with normal excursions and percussion. Breasts: defer, getting annual mammograms, no concerns Pelvic:deferred no concerns Abdomen: Soft, +BS, Non tender, no guarding, rebound, hernias, masses, or organomegaly. .  Lymphatics: Non tender without lymphadenopathy.  Musculoskeletal: Full ROM all peripheral extremities,5/5 strength, and normal gait. Positive right CVA tenderness Skin: + varicose veins bilateral legs. Warm, dry without rashes, lesions, ecchymosis.  Neuro: Cranial nerves intact, reflexes equal bilaterally. Normal muscle tone, no cerebellar  symptoms. Sensation intact Psych: Awake and oriented X 3, normal affect, Insight and Judgment appropriate.   EKG: IRBBB, no ST changes  Deavin Forst Mikki Santee, NP 10:21 AM [facility]

## 2022-05-02 ENCOUNTER — Other Ambulatory Visit: Payer: Self-pay | Admitting: Nurse Practitioner

## 2022-05-02 ENCOUNTER — Ambulatory Visit (INDEPENDENT_AMBULATORY_CARE_PROVIDER_SITE_OTHER): Payer: Medicare Other | Admitting: Nurse Practitioner

## 2022-05-02 ENCOUNTER — Encounter: Payer: Self-pay | Admitting: Nurse Practitioner

## 2022-05-02 ENCOUNTER — Encounter: Payer: Medicare Other | Admitting: Adult Health

## 2022-05-02 ENCOUNTER — Ambulatory Visit
Admission: RE | Admit: 2022-05-02 | Discharge: 2022-05-02 | Disposition: A | Discharge status: Home / Self Care | Payer: Medicare Other | Source: Ambulatory Visit | Attending: Nurse Practitioner | Admitting: Nurse Practitioner

## 2022-05-02 VITALS — BP 130/72 | HR 78 | Temp 97.7°F | Ht 65.0 in | Wt 181.4 lb

## 2022-05-02 DIAGNOSIS — E669 Obesity, unspecified: Secondary | ICD-10-CM | POA: Diagnosis not present

## 2022-05-02 DIAGNOSIS — M5136 Other intervertebral disc degeneration, lumbar region: Secondary | ICD-10-CM | POA: Diagnosis not present

## 2022-05-02 DIAGNOSIS — E785 Hyperlipidemia, unspecified: Secondary | ICD-10-CM

## 2022-05-02 DIAGNOSIS — N1831 Chronic kidney disease, stage 3a: Secondary | ICD-10-CM

## 2022-05-02 DIAGNOSIS — E1142 Type 2 diabetes mellitus with diabetic polyneuropathy: Secondary | ICD-10-CM

## 2022-05-02 DIAGNOSIS — N183 Chronic kidney disease, stage 3 unspecified: Secondary | ICD-10-CM | POA: Diagnosis not present

## 2022-05-02 DIAGNOSIS — K5730 Diverticulosis of large intestine without perforation or abscess without bleeding: Secondary | ICD-10-CM

## 2022-05-02 DIAGNOSIS — Z79899 Other long term (current) drug therapy: Secondary | ICD-10-CM | POA: Diagnosis not present

## 2022-05-02 DIAGNOSIS — R109 Unspecified abdominal pain: Secondary | ICD-10-CM

## 2022-05-02 DIAGNOSIS — G2581 Restless legs syndrome: Secondary | ICD-10-CM

## 2022-05-02 DIAGNOSIS — I10 Essential (primary) hypertension: Secondary | ICD-10-CM

## 2022-05-02 DIAGNOSIS — E559 Vitamin D deficiency, unspecified: Secondary | ICD-10-CM

## 2022-05-02 DIAGNOSIS — M545 Low back pain, unspecified: Secondary | ICD-10-CM

## 2022-05-02 DIAGNOSIS — E1122 Type 2 diabetes mellitus with diabetic chronic kidney disease: Secondary | ICD-10-CM | POA: Diagnosis not present

## 2022-05-02 DIAGNOSIS — E1169 Type 2 diabetes mellitus with other specified complication: Secondary | ICD-10-CM

## 2022-05-02 DIAGNOSIS — K2100 Gastro-esophageal reflux disease with esophagitis, without bleeding: Secondary | ICD-10-CM | POA: Diagnosis not present

## 2022-05-02 DIAGNOSIS — M5126 Other intervertebral disc displacement, lumbar region: Secondary | ICD-10-CM

## 2022-05-02 DIAGNOSIS — K429 Umbilical hernia without obstruction or gangrene: Secondary | ICD-10-CM | POA: Diagnosis not present

## 2022-05-02 DIAGNOSIS — Z0001 Encounter for general adult medical examination with abnormal findings: Secondary | ICD-10-CM

## 2022-05-02 DIAGNOSIS — D7389 Other diseases of spleen: Secondary | ICD-10-CM | POA: Diagnosis not present

## 2022-05-02 DIAGNOSIS — K7689 Other specified diseases of liver: Secondary | ICD-10-CM | POA: Diagnosis not present

## 2022-05-02 NOTE — Patient Instructions (Signed)
CT scan of kidneys at [facility] today at 1:15  Kidney Stones  Kidney stones are solid, rock-like deposits that form inside of the kidneys. The kidneys are a pair of organs that make urine. A kidney stone may form in a kidney and move into other parts of the urinary tract, including the tubes that connect the kidneys to the bladder (ureters), the bladder, and the tube that carries urine out of the body (urethra). As the stone moves through these areas, it can cause intense pain and block the flow of urine. Kidney stones are created when high levels of certain minerals are found in the urine. The stones are usually passed out of the body through urination, but in some cases, medical treatment may be needed to remove them. What are the causes? Kidney stones may be caused by: A condition in which certain glands produce too much parathyroid hormone (primary hyperparathyroidism), which causes too much calcium buildup in the blood. A buildup of uric acid crystals in the bladder (hyperuricosuria). Uric acid is a chemical that the body produces when you eat certain foods. It usually exits the body in the urine. Narrowing (stricture) of one or both of the ureters. A kidney blockage that is present at birth (congenital obstruction). Past surgery on the kidney or the ureters, such as gastric bypass surgery. What increases the risk? The following factors may make you more likely to develop this condition: Having had a kidney stone in the past. Having a family history of kidney stones. Not drinking enough water. Eating a diet that is high in protein, salt (sodium), or sugar. Being overweight or obese. What are the signs or symptoms? Symptoms of a kidney stone may include: Pain in the side of the abdomen, right below the ribs (flank pain). Pain usually spreads (radiates) to the groin. Needing to urinate frequently or urgently. Painful urination. Blood in the urine  (hematuria). Nausea. Vomiting. Fever and chills. How is this diagnosed? This condition may be diagnosed based on: Your symptoms and medical history. A physical exam. Blood tests. Urine tests. These may be done before and after the stone passes out of your body through urination. Imaging tests, such as a CT scan, abdominal X-ray, or ultrasound. A procedure to examine the inside of the bladder (cystoscopy). How is this treated? Treatment for kidney stones depends on the size, location, and makeup of the stones. Kidney stones will often pass out of the body through urination. You may need to: Increase your fluid intake to help pass the stone. In some cases, you may be given fluids through an IV and may need to be monitored at the hospital. Take medicine for pain. Make changes in your diet to help prevent kidney stones from coming back. Sometimes, medical procedures are needed to remove a kidney stone. This may involve: A procedure to break up kidney stones using: A focused beam of light (laser therapy). Shock waves (extracorporeal shock wave lithotripsy). Surgery to remove kidney stones. This may be needed if you have severe pain or have stones that block your urinary tract. Follow these instructions at home: Medicines Take over-the-counter and prescription medicines only as told by your health care provider. Ask your health care provider if the medicine prescribed to you requires you to avoid driving or using heavy machinery. Eating and drinking Drink enough fluid to keep your urine pale yellow. You may be instructed to drink at least 8-10 glasses of water each day. This will help you pass the  kidney stone. If directed, change your diet. This may include: Limiting how much sodium you eat. Eating more fruits and vegetables. Limiting how much animal protein--such as red meat, poultry, fish, and eggs--you eat. Follow instructions from your health care provider about eating or drinking  restrictions. General instructions Collect urine samples as told by your health care provider. You may need to collect a urine sample: 24 hours after you pass the stone. 8-12 weeks after passing the kidney stone, and every 6-12 months after that. Strain your urine every time you urinate, for as long as directed. Use the strainer that your health care provider recommends. Do not throw out the kidney stone after passing it. Keep the stone so it can be tested by your health care provider. Testing the makeup of your kidney stone may help prevent you from getting kidney stones in the future. Keep all follow-up visits as told by your health care provider. This is important. You may need follow-up X-rays or ultrasounds to make sure that your stone has passed. How is this prevented? To prevent another kidney stone: Drink enough fluid to keep your urine pale yellow. This is the best way to prevent kidney stones. Eat a healthy diet and follow recommendations from your health care provider about foods to avoid. You may be instructed to eat a low-protein diet. Recommendations vary depending on the type of kidney stone that you have. Maintain a healthy weight. Where to find more information [place] [facility]): www.kidney.[facility]): www.urologyhealth.org Contact a health care provider if: You have pain that gets worse or does not get better with medicine. Get help right away if: You have a fever or chills. You develop severe pain. You develop new abdominal pain. You faint. You are unable to urinate. Summary Kidney stones are solid, rock-like deposits that form inside of the kidneys. Kidney stones can cause nausea, vomiting, blood in the urine, abdominal pain, and the urge to urinate frequently. Treatment for kidney stones depends on the size, location, and makeup of the stones. Kidney stones will often pass out of the body through urination. Kidney stones can be  prevented by drinking enough fluids, eating a healthy diet, and maintaining a healthy weight. This information is not intended to replace advice given to you by your health care provider. Make sure you discuss any questions you have with your health care provider. Document Revised: 06/20/2021 Document Reviewed: 06/04/2021 Elsevier Patient Education  [place].

## 2022-05-03 LAB — LIPID PANEL
Cholesterol: 153 mg/dL (ref <200)
HDL: 36 mg/dL — ABNORMAL LOW (ref >=50)
LDL Cholesterol (Calc): 89 mg/dL (calc)
Non-HDL Cholesterol (Calc): 117 mg/dL (calc) (ref <130)
Total CHOL/HDL Ratio: 4.3 (calc) (ref <5.0)
Triglycerides: 190 mg/dL — ABNORMAL HIGH (ref <150)

## 2022-05-03 LAB — MICROALBUMIN / CREATININE URINE RATIO
Creatinine, Urine: 20 mg/dL (ref 20–275)
Microalb, Ur: <0.2 mg/dL

## 2022-05-03 LAB — CBC WITH DIFFERENTIAL/PLATELET
Absolute Monocytes: 482 cells/uL (ref 200–950)
Basophils Absolute: 47 cells/uL (ref 0–200)
Basophils Relative: 0.6 %
Eosinophils Absolute: 363 cells/uL (ref 15–500)
Eosinophils Relative: 4.6 %
HCT: 41.9 % (ref 35.0–45.0)
Hemoglobin: 14.2 g/dL (ref 11.7–15.5)
Lymphs Abs: 3002 cells/uL (ref 850–3900)
MCH: 29.8 pg (ref 27.0–33.0)
MCHC: 33.9 g/dL (ref 32.0–36.0)
MCV: 87.8 fL (ref 80.0–100.0)
MPV: 9.9 fL (ref 7.5–12.5)
Monocytes Relative: 6.1 %
Neutro Abs: 4005 cells/uL (ref 1500–7800)
Neutrophils Relative %: 50.7 %
Platelets: 199 10*3/uL (ref 140–400)
RBC: 4.77 10*6/uL (ref 3.80–5.10)
RDW: 12.7 % (ref 11.0–15.0)
Total Lymphocyte: 38 %
WBC: 7.9 10*3/uL (ref 3.8–10.8)

## 2022-05-03 LAB — URINE CULTURE
MICRO NUMBER:: 13673807
SPECIMEN QUALITY:: ADEQUATE

## 2022-05-03 LAB — URINALYSIS, ROUTINE W REFLEX MICROSCOPIC
Bilirubin Urine: NEGATIVE
Glucose, UA: NEGATIVE
Hgb urine dipstick: NEGATIVE
Ketones, ur: NEGATIVE
Leukocytes,Ua: NEGATIVE
Nitrite: NEGATIVE
Protein, ur: NEGATIVE
Specific Gravity, Urine: 1.004 (ref 1.001–1.035)
pH: 6.5 (ref 5.0–8.0)

## 2022-05-03 LAB — COMPLETE METABOLIC PANEL WITH GFR
AG Ratio: 1.3 (calc) (ref 1.0–2.5)
ALT: 22 U/L (ref 6–29)
AST: 21 U/L (ref 10–35)
Albumin: 4.3 g/dL (ref 3.6–5.1)
Alkaline phosphatase (APISO): 74 U/L (ref 37–153)
BUN/Creatinine Ratio: 11 (calc) (ref 6–22)
BUN: 13 mg/dL (ref 7–25)
CO2: 23 mmol/L (ref 20–32)
Calcium: 9.4 mg/dL (ref 8.6–10.4)
Chloride: 104 mmol/L (ref 98–110)
Creat: 1.15 mg/dL — ABNORMAL HIGH (ref 0.60–1.00)
Globulin: 3.3 g/dL (calc) (ref 1.9–3.7)
Glucose, Bld: 79 mg/dL (ref 65–99)
Potassium: 3.9 mmol/L (ref 3.5–5.3)
Sodium: 139 mmol/L (ref 135–146)
Total Bilirubin: 0.4 mg/dL (ref 0.2–1.2)
Total Protein: 7.6 g/dL (ref 6.1–8.1)
eGFR: 51 mL/min/{1.73_m2} — ABNORMAL LOW (ref >=60)

## 2022-05-03 LAB — HEMOGLOBIN A1C
Hgb A1c MFr Bld: 7.4 % of total Hgb — ABNORMAL HIGH (ref <5.7)
Mean Plasma Glucose: 166 mg/dL
eAG (mmol/L): 9.2 mmol/L

## 2022-05-03 LAB — MAGNESIUM: Magnesium: 2 mg/dL (ref 1.5–2.5)

## 2022-05-03 LAB — VITAMIN D 25 HYDROXY (VIT D DEFICIENCY, FRACTURES): Vit D, 25-Hydroxy: 77 ng/mL (ref 30–100)

## 2022-05-03 LAB — TSH: TSH: 1.43 mIU/L (ref 0.40–4.50)

## 2022-05-24 DIAGNOSIS — M5451 Vertebrogenic low back pain: Secondary | ICD-10-CM | POA: Diagnosis not present

## 2022-07-01 DIAGNOSIS — M5451 Vertebrogenic low back pain: Secondary | ICD-10-CM | POA: Diagnosis not present

## 2022-07-14 ENCOUNTER — Other Ambulatory Visit: Payer: Self-pay | Admitting: Neurology

## 2022-07-17 ENCOUNTER — Other Ambulatory Visit: Payer: Self-pay | Admitting: Internal Medicine

## 2022-07-17 DIAGNOSIS — Z1231 Encounter for screening mammogram for malignant neoplasm of breast: Secondary | ICD-10-CM

## 2022-08-02 ENCOUNTER — Other Ambulatory Visit: Payer: Self-pay | Admitting: Neurology

## 2022-08-02 DIAGNOSIS — G2581 Restless legs syndrome: Secondary | ICD-10-CM

## 2022-08-05 ENCOUNTER — Encounter: Payer: Self-pay | Admitting: Internal Medicine

## 2022-08-05 NOTE — Progress Notes (Signed)
Future Appointments  Date Time Provider Department  08/06/2022 10:30 AM Unk Pinto, MD [facility]  09/27/2022 10:50 AM Narda Amber K, DO [facility]  12/17/2022                      wellness  3:00 PM Darrol Jump, NP [facility]  12/30/2022  2:00 PM Bernarda Caffey, MD [facility]  05/05/2023                    cpe 10:00 AM Alycia Rossetti, NP [facility]    History of Present Illness:       This very nice 71 y.o. WWF presents for 3 month follow up with HTN, HLD, Pre-Diabetes and Vitamin D Deficiency.        Patient is treated for HTN  ( 2018 ) & BP has been controlled at home. Today's BP is at goal - 114/60 . Patient has had no complaints of any cardiac type chest pain, palpitations, dyspnea /orthopnea / PND, dizziness, claudication  or dependent edema.       Hyperlipidemia is controlled with diet & Ezetimibe.  Patient denies myalgias or other med SE's. Last Lipids were at goal :  Lab Results  Component Value Date   CHOL 131 05/02/2021   HDL 44 (L) 05/02/2021   LDLCALC 69 05/02/2021   TRIG 101 05/02/2021   CHOLHDL 3.0 05/02/2021     Also, the patient has history of T2_NIDDM  on Glipizide since 2001 w/CKD3a  (GFR 57) and has had no symptoms of reactive hypoglycemia, diabetic polys, paresthesias or visual blurring.  Last A1c was not at goal :  Lab Results  Component Value Date   HGBA1C 7.2 (H) 05/02/2021                                                          Further, the patient also has history of Vitamin D Deficiency and supplements vitamin D without any suspected side-effects. Last vitamin D was at goal :  Lab Results  Component Value Date   VD25OH 66 08/15/2020     Current Outpatient Medications on File Prior to Visit  Medication Sig   acetaminophen  325 MG tablet Take 650 mg every 6 hours as needed.   aspirin EC 81 MG tablet Take  daily.   CRANBERRY EXTRACT PO Take 2 tablets daily.   ezetimibe 10 MG tablet Take 1 tablet daily.   FIBER ADULT GUMMIES   Take daily.   glipiZIDE 5 MG tablet Take  1/2 to 1 tablet  3 x /day   with Meals    lisinopril 10 MG tablet TAKE 1 TABLET DAILY    Multiple Vitamins-Minerals  Take  daily.   OTC eye drops  Uses for dry eyes.   Phenazopyridine (AZO  ) Take 2 tablets  daily.   rOPINIRole  0.5 MG tablet Take 1 tablet at noon and 4pm   VITAMIN D Take 4,000 Units daily.     Allergies  Allergen Reactions   Hctz [Hydrochlorothiazide]     Leg cramping   Metformin And Related Nausea Only   Phenylbutazones Swelling   Tropicamide Swelling    Red /itching/watery eyes    PMHx:   Past Medical History:  Diagnosis Date   Allergy    otc  med prn   Arthritis    hips, lower back   Cataract    bilateral   Chronic kidney disease    stage 3 per patient    Diet-controlled type 2 diabetes mellitus ([place])    Diet controlled, no meds   Diverticulosis    Dribbling urine    EKG abnormalities    pt states she had a BBB on her last EKG, no problems   Essential hypertension, benign 08/19/2013   HPV in female    Hyperlipidemia    diet controlled, no med   Restless leg syndrome    Snoring    Vitamin D deficiency      Immunization History  Administered Date(s) Administered   Influenza, High Dose  08/01/2017, 09/22/2018, 07/20/2019   Moderna SARS-COV2 Booster 01/12/2021   Moderna Sars-Covid-2 Vacc 01/18/2020, 02/15/2020, 08/18/2020   Pneumococcal -13 04/05/2016   Pneumococcal -23 04/15/2017   Tdap 03/07/2011     Past Surgical History:  Procedure Laterality Date   BREAST BIOPSY Left 1969   left lumpectomy - benign   COLONOSCOPY  2012   kaplan-polyps, tics   LUMBAR LAMINECTOMY/DECOMPRESSION MICRODISCECTOMY  05/07/2012   Procedure: LUMBAR LAMINECTOMY/DECOMPRESSION MICRODISCECTOMY;  Surgeon: Sinclair Ship, MD;  Location: [place];  Service: Orthopedics;  Laterality: Bilateral;  Lumbar 4-5 decompression   OVARIAN CYST REMOVAL  1981   right   TUBAL LIGATION  1981   VAGINAL HYSTERECTOMY  1992    FHx:     Reviewed / unchanged  SHx:    Reviewed / unchanged   Systems Review:  Constitutional: Denies fever, chills, wt changes, headaches, insomnia, fatigue, night sweats, change in appetite. Eyes: Denies redness, blurred vision, diplopia, discharge, itchy, watery eyes.  ENT: Denies discharge, congestion, post nasal drip, epistaxis, sore throat, earache, hearing loss, dental pain, tinnitus, vertigo, sinus pain, snoring.  CV: Denies chest pain, palpitations, irregular heartbeat, syncope, dyspnea, diaphoresis, orthopnea, PND, claudication or edema. Respiratory: denies cough, dyspnea, DOE, pleurisy, hoarseness, laryngitis, wheezing.  Gastrointestinal: Denies dysphagia, odynophagia, heartburn, reflux, water brash, abdominal pain or cramps, nausea, vomiting, bloating, diarrhea, constipation, hematemesis, melena, hematochezia  or hemorrhoids. Genitourinary: Denies dysuria, frequency, urgency, nocturia, hesitancy, discharge, hematuria or flank pain. Musculoskeletal: Denies arthralgias, myalgias, stiffness, jt. swelling, pain, limping or strain/sprain.  Skin: Denies pruritus, rash, hives, warts, acne, eczema or change in skin lesion(s). Neuro: No weakness, tremor, incoordination, spasms, paresthesia or pain. Psychiatric: Denies confusion, memory loss or sensory loss. Endo: Denies change in weight, skin or hair change.  Heme/Lymph: No excessive bleeding, bruising or enlarged lymph nodes.  Physical Exam  BP 114/60   Pulse 73   Temp 97.6 F (36.4 C)   Resp 16   Ht '5\' 5"'$  (1.651 m)   Wt 181 lb 12.8 oz (82.5 kg)   SpO2 98%   BMI 30.25 kg/m   Appears  over nourished, well groomed  and in no distress.  Eyes: PERRLA, EOMs, conjunctiva no swelling or erythema. Sinuses: No frontal/maxillary tenderness ENT/Mouth: EAC's clear, TM's nl w/o erythema, bulging. Nares clear w/o erythema, swelling, exudates. Oropharynx clear without erythema or exudates. Oral hygiene is good. Tongue normal, non obstructing.  Hearing intact.  Neck: Supple. Thyroid not palpable. Car 2+/2+ without bruits, nodes or JVD. Chest: Respirations nl with BS clear & equal w/o rales, rhonchi, wheezing or stridor.  Cor: Heart sounds normal w/ regular rate and rhythm without sig. murmurs, gallops, clicks or rubs. Peripheral pulses normal and equal  without edema.  Abdomen: Soft & bowel sounds normal. Non-tender  w/o guarding, rebound, hernias, masses or organomegaly.  Lymphatics: Unremarkable.  Musculoskeletal: Full ROM all peripheral extremities, joint stability, 5/5 strength and normal gait.  Skin: Warm, dry without exposed rashes, lesions or ecchymosis apparent.  Neuro: Cranial nerves intact, reflexes equal bilaterally. Sensory-motor testing grossly intact. Tendon reflexes grossly intact.  Pysch: Alert & oriented x 3.  Insight and judgement nl & appropriate. No ideations.  Assessment and Plan:  1. Essential hypertension  - Continue medication, monitor blood pressure at home.  - Continue DASH diet.  Reminder to go to the ER if any CP,  SOB, nausea, dizziness, severe HA, changes vision/speech.    - CBC with Differential/Platelet - COMPLETE METABOLIC PANEL WITH GFR - Magnesium - TSH  2. Hyperlipidemia associated with type 2 diabetes mellitus ([place])  - Continue diet/meds, exercise,& lifestyle modifications.  - Continue monitor periodic cholesterol/liver & renal functions   - Lipid panel - TSH  3. Type 2 diabetes mellitus with stage 3a chronic kidney  disease, without long-term current use of insulin (HCC)  - Continue diet, exercise  - Lifestyle modifications.  - Monitor appropriate labs   -  Hgb A1c  - Insulin, random - PTH, intact and calcium  4. Vitamin D deficiency  - Continue supplementation  - VITAMIN D 25 Hydroxy   5. Medication management  - CBC with Differential/Platelet - COMPLETE METABOLIC PANEL WITH GFR - Magnesium - Lipid panel - TSH - Hgb A1c - Insulin, random - VITAMIN D 25 Hydroxy   - PTH, intact and calcium          Discussed  regular exercise, BP monitoring, weight control to achieve/maintain BMI less than 25 and discussed med and SE's. Recommended labs to assess and monitor clinical status with further disposition pending results of labs.  I discussed the assessment and treatment plan with the patient. The patient was provided an opportunity to ask questions and all were answered. The patient agreed with the plan and demonstrated an understanding of the instructions.  I provided over 30 minutes of exam, counseling, chart review and  complex critical decision making.        The patient was advised to call back or seek an in-person evaluation if the symptoms worsen or if the condition fails to improve as anticipated.   Kirtland Bouchard, MD.

## 2022-08-05 NOTE — Patient Instructions (Signed)

## 2022-08-06 ENCOUNTER — Ambulatory Visit (INDEPENDENT_AMBULATORY_CARE_PROVIDER_SITE_OTHER): Payer: Medicare Other | Admitting: Internal Medicine

## 2022-08-06 ENCOUNTER — Encounter: Payer: Self-pay | Admitting: Internal Medicine

## 2022-08-06 VITALS — BP 114/60 | HR 73 | Temp 97.6°F | Resp 16 | Ht 65.0 in | Wt 181.8 lb

## 2022-08-06 DIAGNOSIS — I10 Essential (primary) hypertension: Secondary | ICD-10-CM

## 2022-08-06 DIAGNOSIS — E1169 Type 2 diabetes mellitus with other specified complication: Secondary | ICD-10-CM

## 2022-08-06 DIAGNOSIS — E785 Hyperlipidemia, unspecified: Secondary | ICD-10-CM

## 2022-08-06 DIAGNOSIS — N1831 Chronic kidney disease, stage 3a: Secondary | ICD-10-CM

## 2022-08-06 DIAGNOSIS — G2581 Restless legs syndrome: Secondary | ICD-10-CM | POA: Diagnosis not present

## 2022-08-06 DIAGNOSIS — Z79899 Other long term (current) drug therapy: Secondary | ICD-10-CM

## 2022-08-06 DIAGNOSIS — E559 Vitamin D deficiency, unspecified: Secondary | ICD-10-CM

## 2022-08-06 DIAGNOSIS — E1122 Type 2 diabetes mellitus with diabetic chronic kidney disease: Secondary | ICD-10-CM | POA: Diagnosis not present

## 2022-08-06 DIAGNOSIS — Z23 Encounter for immunization: Secondary | ICD-10-CM

## 2022-08-06 MED ORDER — ROPINIROLE HCL 1 MG PO TABS: ORAL_TABLET | ORAL | 3 refills | Status: DC

## 2022-08-06 MED ORDER — ROPINIROLE HCL 0.5 MG PO TABS: ORAL_TABLET | ORAL | 3 refills | Status: DC

## 2022-08-07 LAB — CBC WITH DIFFERENTIAL/PLATELET
Absolute Monocytes: 319 cells/uL (ref 200–950)
Basophils Absolute: 28 cells/uL (ref 0–200)
Basophils Relative: 0.5 %
Eosinophils Absolute: 179 cells/uL (ref 15–500)
Eosinophils Relative: 3.2 %
HCT: 43.3 % (ref 35.0–45.0)
Hemoglobin: 14.7 g/dL (ref 11.7–15.5)
Lymphs Abs: 1792 cells/uL (ref 850–3900)
MCH: 30 pg (ref 27.0–33.0)
MCHC: 33.9 g/dL (ref 32.0–36.0)
MCV: 88.4 fL (ref 80.0–100.0)
MPV: 9.7 fL (ref 7.5–12.5)
Monocytes Relative: 5.7 %
Neutro Abs: 3282 cells/uL (ref 1500–7800)
Neutrophils Relative %: 58.6 %
Platelets: 167 10*3/uL (ref 140–400)
RBC: 4.9 10*6/uL (ref 3.80–5.10)
RDW: 12.2 % (ref 11.0–15.0)
Total Lymphocyte: 32 %
WBC: 5.6 10*3/uL (ref 3.8–10.8)

## 2022-08-07 LAB — COMPLETE METABOLIC PANEL WITH GFR
AG Ratio: 1.3 (calc) (ref 1.0–2.5)
ALT: 21 U/L (ref 6–29)
AST: 18 U/L (ref 10–35)
Albumin: 4.3 g/dL (ref 3.6–5.1)
Alkaline phosphatase (APISO): 76 U/L (ref 37–153)
BUN/Creatinine Ratio: 14 (calc) (ref 6–22)
BUN: 18 mg/dL (ref 7–25)
CO2: 27 mmol/L (ref 20–32)
Calcium: 9.5 mg/dL (ref 8.6–10.4)
Chloride: 102 mmol/L (ref 98–110)
Creat: 1.25 mg/dL — ABNORMAL HIGH (ref 0.60–1.00)
Globulin: 3.3 g/dL (calc) (ref 1.9–3.7)
Glucose, Bld: 116 mg/dL — ABNORMAL HIGH (ref 65–99)
Potassium: 4.3 mmol/L (ref 3.5–5.3)
Sodium: 137 mmol/L (ref 135–146)
Total Bilirubin: 0.4 mg/dL (ref 0.2–1.2)
Total Protein: 7.6 g/dL (ref 6.1–8.1)
eGFR: 46 mL/min/{1.73_m2} — ABNORMAL LOW (ref >=60)

## 2022-08-07 LAB — INSULIN, RANDOM: Insulin: 52.2 u[IU]/mL — ABNORMAL HIGH

## 2022-08-07 LAB — TSH: TSH: 1.29 mIU/L (ref 0.40–4.50)

## 2022-08-07 LAB — LIPID PANEL
Cholesterol: 172 mg/dL (ref <200)
HDL: 40 mg/dL — ABNORMAL LOW (ref >=50)
LDL Cholesterol (Calc): 100 mg/dL (calc) — ABNORMAL HIGH
Non-HDL Cholesterol (Calc): 132 mg/dL (calc) — ABNORMAL HIGH (ref <130)
Total CHOL/HDL Ratio: 4.3 (calc) (ref <5.0)
Triglycerides: 199 mg/dL — ABNORMAL HIGH (ref <150)

## 2022-08-07 LAB — HEMOGLOBIN A1C
Hgb A1c MFr Bld: 7.8 % of total Hgb — ABNORMAL HIGH (ref <5.7)
Mean Plasma Glucose: 177 mg/dL
eAG (mmol/L): 9.8 mmol/L

## 2022-08-07 LAB — MAGNESIUM: Magnesium: 2.1 mg/dL (ref 1.5–2.5)

## 2022-08-07 LAB — VITAMIN D 25 HYDROXY (VIT D DEFICIENCY, FRACTURES): Vit D, 25-Hydroxy: 75 ng/mL (ref 30–100)

## 2022-08-07 NOTE — Progress Notes (Signed)
<><><><><><><><><><><><><><><><><><><><><><><><><><><><><><><><><> <><><><><><><><><><><><><><><><><><><><><><><><><><><><><><><><><> - Test results slightly outside the reference range are not unusual. If there is anything important, I will review this with you,  otherwise it is considered normal test values.  If you have further questions,  please do not hesitate to contact me at the office or via My Chart.  <><><><><><><><><><><><><><><><><><><><><><><><><><><><><><><><><> <><><><><><><><><><><><><><><><><><><><><><><><><><><><><><><><><>  - Kidney functions look a little dehydrated   ( GFR is slightly decreased )    - Very important to drink adequate amounts of fluids to prevent permanent damage    - Recommend drink at least 6 bottles (16 ounces) of fluids /water /day = 96 Oz ~100 oz  - 100 oz = 3,000 cc or 3 liters / day  - >> That's 1 &1/2 bottles of a 2 liter soda bottle /day !  <><><><><><><><><><><><><><><><><><><><><><><><><><><><><><><><><>  -  Total Chol = 172     &     LDL Chol = 100     -     Both  Excellent   - Very low risk for Heart Attack  / Stroke <><><><><><><><><><><><><><><><><><><><><><><><><><><><><><><><><>  -    A1c is a little higher                       - Has gone up from 7.4% to now 7.8% and                                                                                     goal is less than 6.0%    Being diabetic has a  300% increased risk for heart attack,                                  stroke, cancer, and alzheimer- type vascular dementia.    It is very important that you work harder with diet by                     avoiding all foods that are white except chicken, fish & calliflower.   - Avoid white rice  (brown & wild rice is OK),   - Avoid white potatoes  (sweet potatoes in moderation is OK),   White bread or wheat bread or anything made out of                                                          white flour like bagels, donuts,  rolls, buns, biscuits, cakes,  - pastries, cookies, pizza crust, and pasta (made from white flour & egg whites)   - vegetarian pasta or spinach or wheat pasta is OK.  - Multi grain breads like Arnold's, Pepperidge Farm or                        Multi grain sandwich thins or high fiber breads like  E[place]ka bread or "Dave's Killer" breads that are                                                                  4 to 5 grams fiber per slice !  are best.    - Diet, exercise and weight loss is very important in the                                    control and prevention of complications of diabetes which                                 affects every system in your body, ie.  <><><><><><><><><><><><><><><><><><><><><><><><><><><><><><><><><> <><><><><><><><><><><><><><><><><><><><><><><><><><><><><><><><><>  - Vitamin D = 75  - Excellent   - Please keep dose  same  <><><><><><><><><><><><><><><><><><><><><><><><><><><><><><><><><>  -  All Else - CBC - Kidneys - Electrolytes - Liver - Magnesium & Thyroid    - all  Normal / OK <><><><><><><><><><><><><><><><><><><><><><><><><><><><><><><><><> <><><><><><><><><><><><><><><><><><><><><><><><><><><><><><><><><>

## 2022-08-16 ENCOUNTER — Ambulatory Visit
Admission: RE | Admit: 2022-08-16 | Discharge: 2022-08-16 | Disposition: A | Discharge status: Home / Self Care | Payer: Medicare Other | Source: Ambulatory Visit | Attending: Internal Medicine | Admitting: Internal Medicine

## 2022-08-16 DIAGNOSIS — Z1231 Encounter for screening mammogram for malignant neoplasm of breast: Secondary | ICD-10-CM | POA: Diagnosis not present

## 2022-08-21 ENCOUNTER — Other Ambulatory Visit: Payer: Self-pay | Admitting: Internal Medicine

## 2022-08-21 DIAGNOSIS — R928 Other abnormal and inconclusive findings on diagnostic imaging of breast: Secondary | ICD-10-CM

## 2022-09-04 ENCOUNTER — Ambulatory Visit: Admission: RE | Admit: 2022-09-04 | Payer: Medicare Other | Source: Ambulatory Visit

## 2022-09-04 ENCOUNTER — Ambulatory Visit
Admission: RE | Admit: 2022-09-04 | Discharge: 2022-09-04 | Disposition: A | Discharge status: Home / Self Care | Payer: Medicare Other | Source: Ambulatory Visit | Attending: Internal Medicine | Admitting: Internal Medicine

## 2022-09-04 DIAGNOSIS — R928 Other abnormal and inconclusive findings on diagnostic imaging of breast: Secondary | ICD-10-CM

## 2022-09-25 ENCOUNTER — Encounter: Payer: Self-pay | Admitting: Internal Medicine

## 2022-09-26 NOTE — Progress Notes (Deleted)
Follow-up Visit   Date: 09/26/22    Rachel Daniel MRN: [id number] DOB: October 18, 1950   Interim History: Rachel Daniel is a 71 y.o. right-handed Caucasian female with diabetes mellitus (HbA1c 6.5), hypertension, RLS returning to the clinic for follow-up of restless leg syndrome.  The patient was accompanied to the clinic by self.   History of present illness: Starting around her 35s, she recalls having achy pain of the right thigh, which would prohibit her from falling asleep.  Her husband often complained that she would kick him all night long.  She has squeezing, stabbing, knife-like pain of bilateral thighs. The lower legs and feet are spared.  She only has relief with walking and stretching.  Pain was severe at night time, but over the past several years started having daytime symptoms and she cannot get relief with anything. Her pain is better on the weekends when she is less sedentary.  She was diagnosed with RLS in her 25s and started on ropinorole and takes '3mg'$  at bedtime which provides relief until lunch time.  She is unable to take ropinorole during the day because of sedation.  She is intolerant to Lyrica.  She denies any weakness.    UPDATE 12//13/2021  She is here for 1 year follow-up.  She is doing well on ropinirole 0.'5mg'$  at noon, 0.'5mg'$  at 4pm, and '3mg'$  at bedtime.  She continues to have some restless sensation in the afternoon, which she manages with walking around.  Symptoms are not bothersome enough to increase the dose of her medication.  Nighttime symptoms are well-controlled and she does not wake up at nighttime.  She usually drinks coffee in the morning and enjoys tea later in the day.   UPDATE 09/24/2021:  She is here for follow-up visit. She continues to take ropinirole 0.'5mg'$  at noon, 0.'5mg'$  at 4p,and '3mg'$  at bedtime; however, reports having breakthrough symptoms in the evening before her night time dose. She tries to walk around and tried taking the bedtime dose  earlier, but it makes her very sleepy.   Medications:  Current Outpatient Medications on File Prior to Visit  Medication Sig Dispense Refill   acetaminophen (TYLENOL) 325 MG tablet Take 650 mg by mouth every 6 (six) hours as needed.     aspirin EC 81 MG tablet Take 81 mg by mouth daily.     CRANBERRY EXTRACT PO Take 2 tablets by mouth daily.     FIBER ADULT GUMMIES PO Take by mouth daily.     glipiZIDE (GLUCOTROL) 5 MG tablet TAKE 1/2 TO 1 TABLET BY MOUTH THREE TIMES DAILY WITH MEALS( FOR DIABETES) 270 tablet 1   lisinopril (ZESTRIL) 20 MG tablet Take 1 tablet (20 mg total) by mouth daily. for blood pressure 90 tablet 3   Multiple Vitamins-Minerals (MULTIVITAMIN PO) Take by mouth daily.     OVER THE COUNTER MEDICATION Uses OTC eye drops for dry eyes.     Phenazopyridine HCl (AZO TABS PO) Take 2 tablets by mouth daily.     rOPINIRole (REQUIP) 1 MG tablet Take  1 tablet  3 x /day  with Meals  for Restless Legs 270 tablet 3   rOPINIRole (REQUIP) 3 MG tablet TAKE 1 TABLET BY MOUTH AT BEDTIME FOR RESTLESS LEGS 90 tablet 0   VITAMIN D, CHOLECALCIFEROL, PO Take 4,000 Units by mouth daily.     No current facility-administered medications on file prior to visit.    Allergies:  Allergies  Allergen Reactions   Hctz [  Hydrochlorothiazide]     Leg cramping   Metformin And Related Nausea Only   Phenylbutazones Swelling   Tropicamide Swelling    Red /itching/watery eyes    Vital Signs:  There were no vitals taken for this visit.    Neurological Exam: MENTAL STATUS including orientation to time, place, person, and recent and remote memory is normal.  Speech is not dysarthric.  CRANIAL NERVES:  Extraocular muscles intact  MOTOR:  Motor strength is 5/5 throughout. No atrophy, fasciculations or abnormal movements.  No pronator drift.  Tone is normal.    MSRs:  Reflexes are 2+/4, except absent right Achilles  SENSORY:  Vibration intact throughout  COORDINATION/GAIT:  Gait narrow based and  stable.   Data: Lab Results  Component Value Date   FERRITIN 100 10/10/2016   Lab Results  Component Value Date   TSH 1.29 08/06/2022   Lab Results  Component Value Date   [id number] 04/06/2015    IMPRESSION/PLAN:  Restless leg syndrome, *** - Continue ropinirole 0.'5mg'$  at noon, 0.'5mg'$  at 4p, and '3mg'$  at bedtime. Maximum dose for RLS. Refilled - Start gabapentin '100mg'$  at 6p. Titrate as tolerable. Patient to call with update in 2-3 weeks - Consider adding sinemet going forward as needed  Return to clinic in 1 year   Thank you for allowing me to participate in patient's care.  If I can answer any additional questions, I would be pleased to do so.    Sincerely,    Latavious Bitter K. Posey Pronto, DO

## 2022-09-27 ENCOUNTER — Ambulatory Visit: Payer: Medicare Other | Admitting: Neurology

## 2022-10-08 IMAGING — MG DIGITAL SCREENING BILAT W/ TOMO W/ CAD
8 series · 8 of 24 positions shown · non-contrast
Comparison: Previous exam(s).

CLINICAL DATA: Screening.

EXAM:
DIGITAL SCREENING BILATERAL MAMMOGRAM WITH TOMO AND CAD

[L MLO synth-2D]
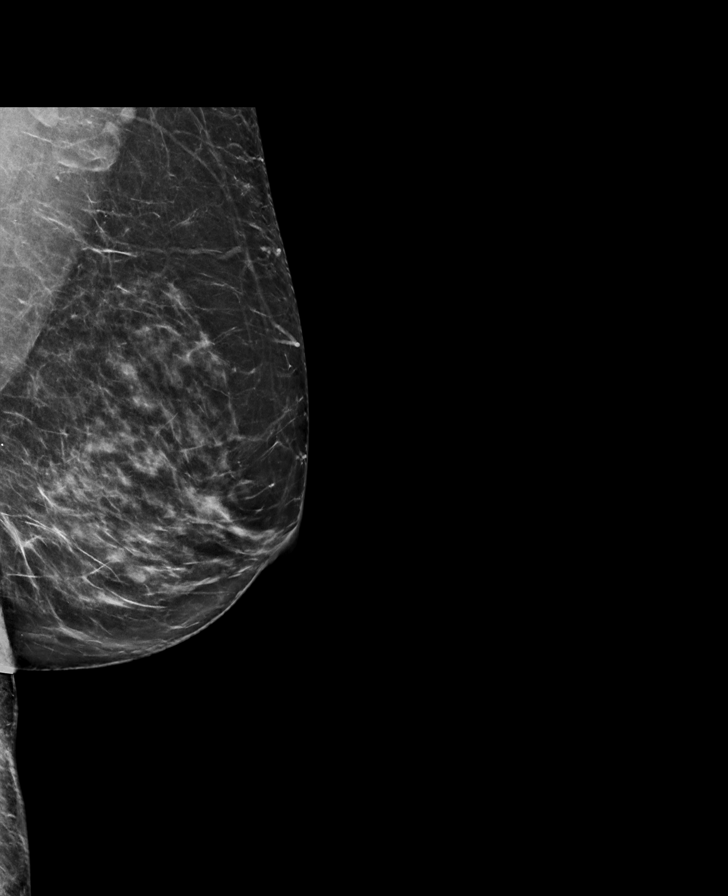

[L CC synth-2D]
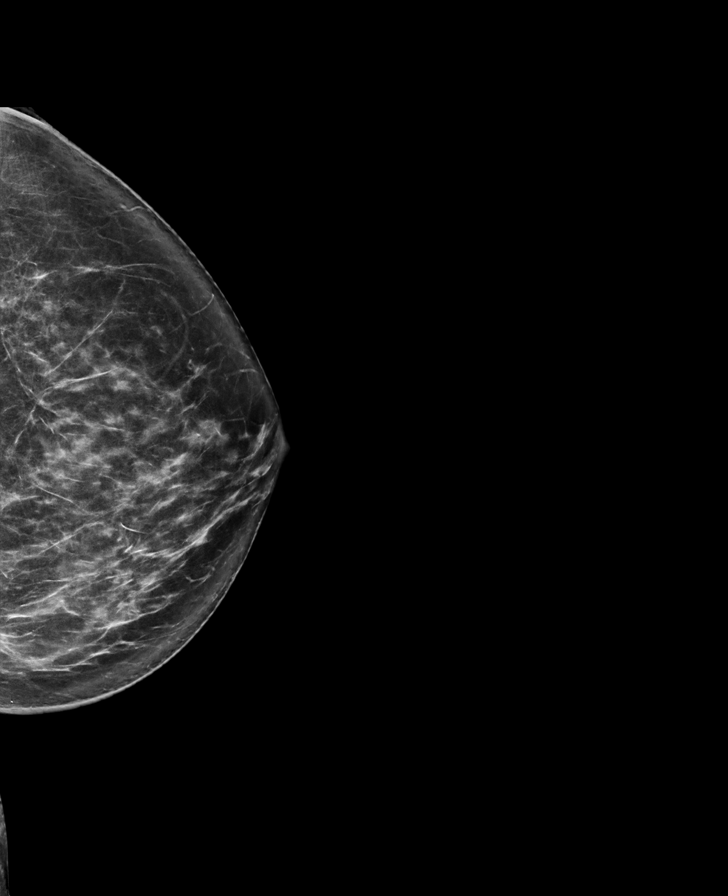

[R MLO synth-2D]
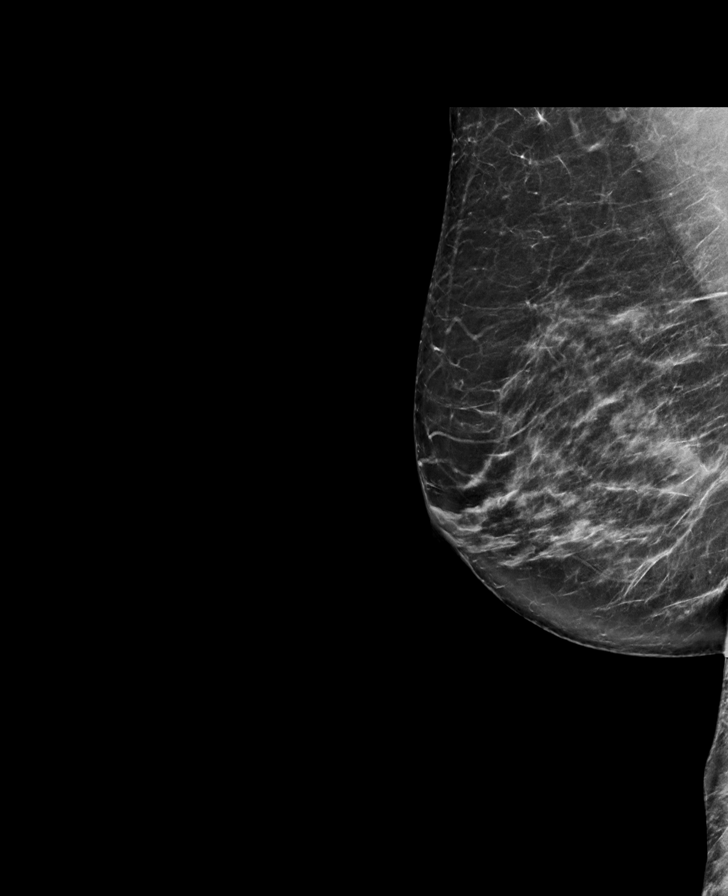

[R CC synth-2D]
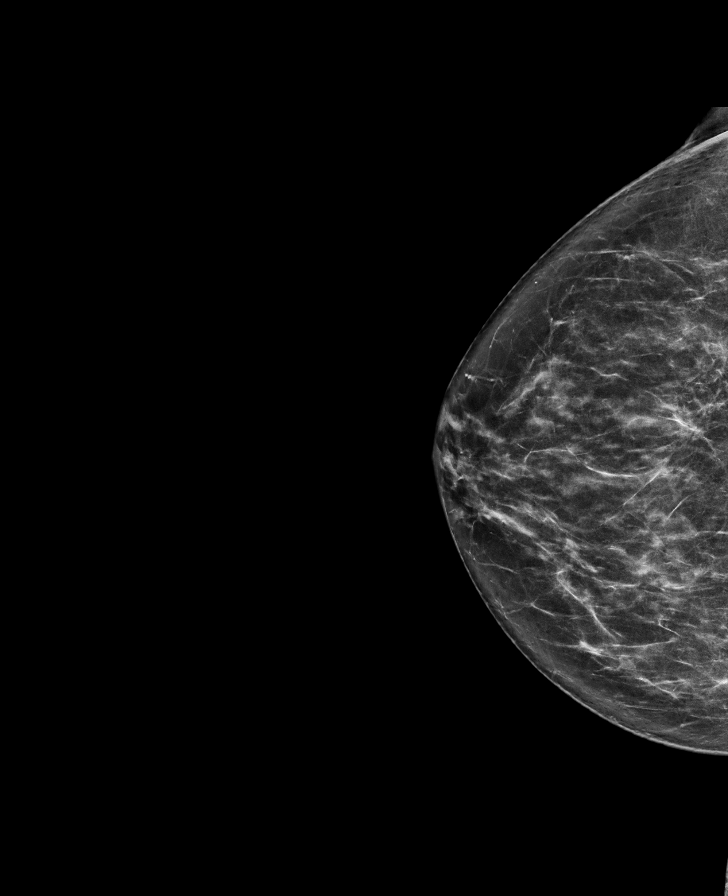

[L MLO tomo · tomo slice 39/77.0]
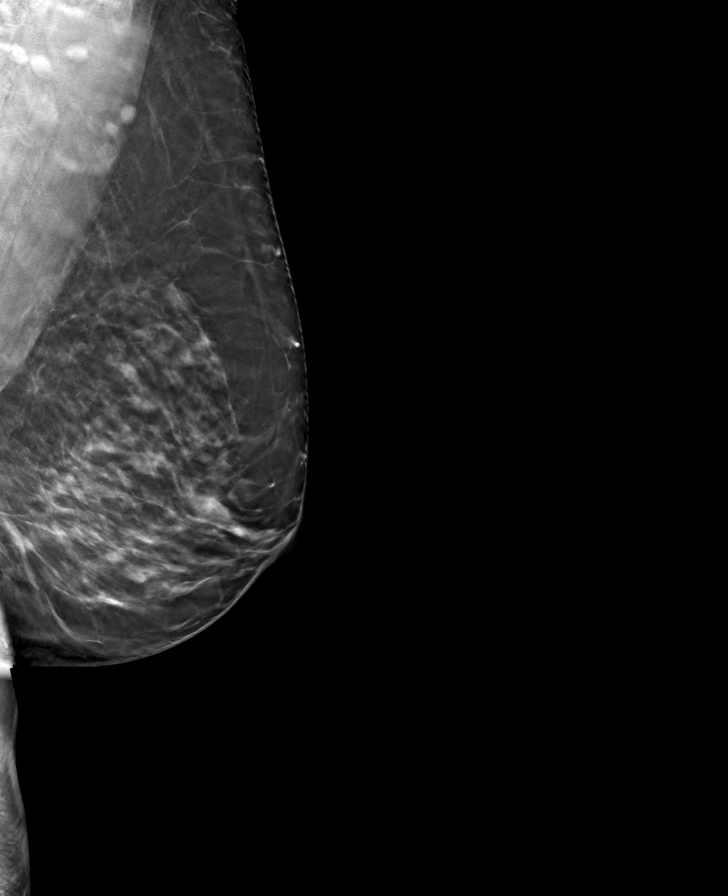

[R MLO tomo · tomo slice 41/81.0]
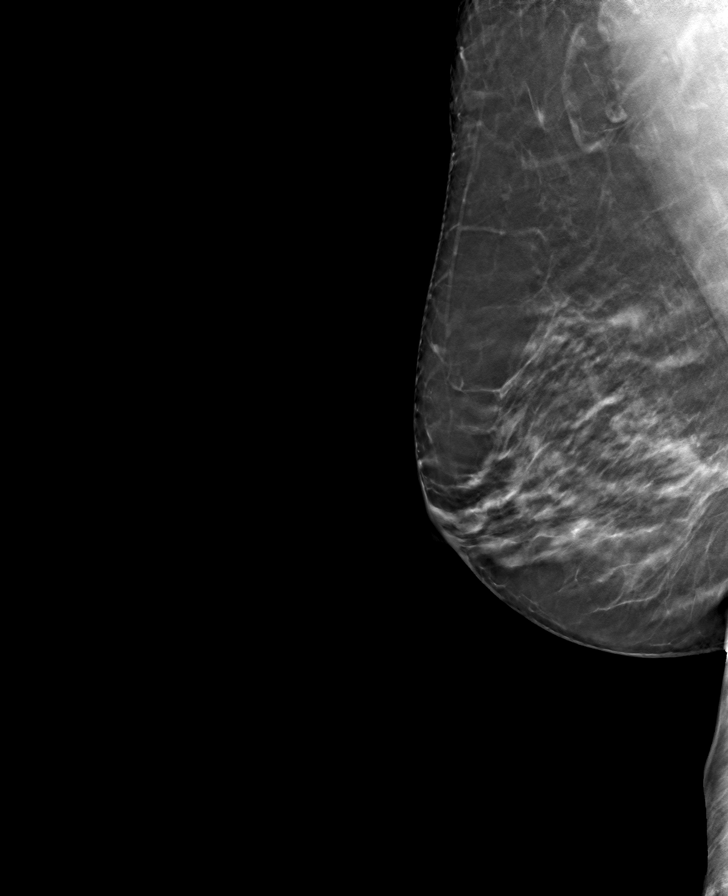

[R CC tomo · tomo slice 36/71.0]
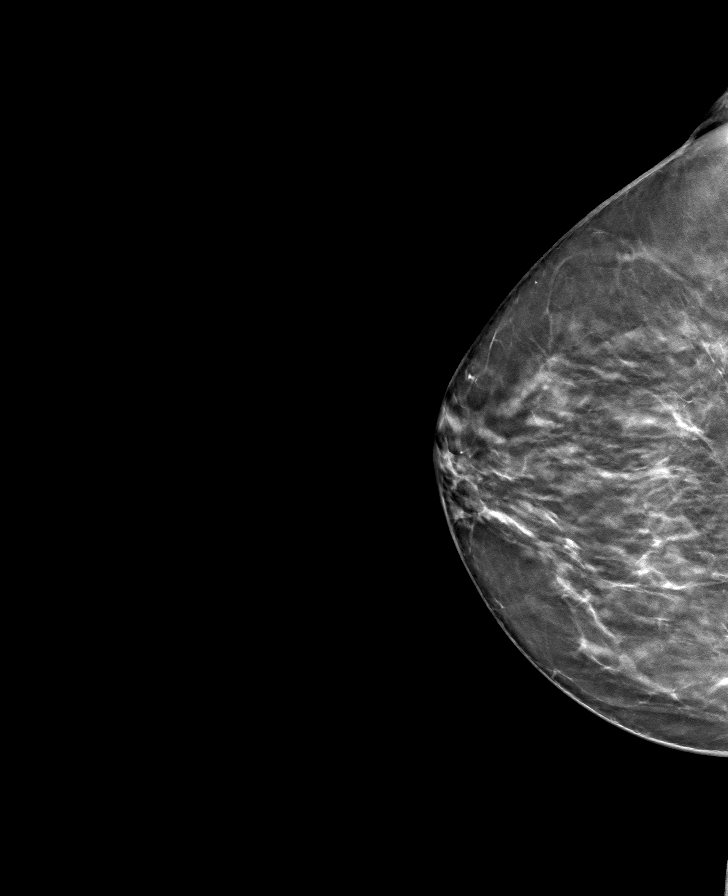

[L CC tomo · tomo slice 37/74.0]
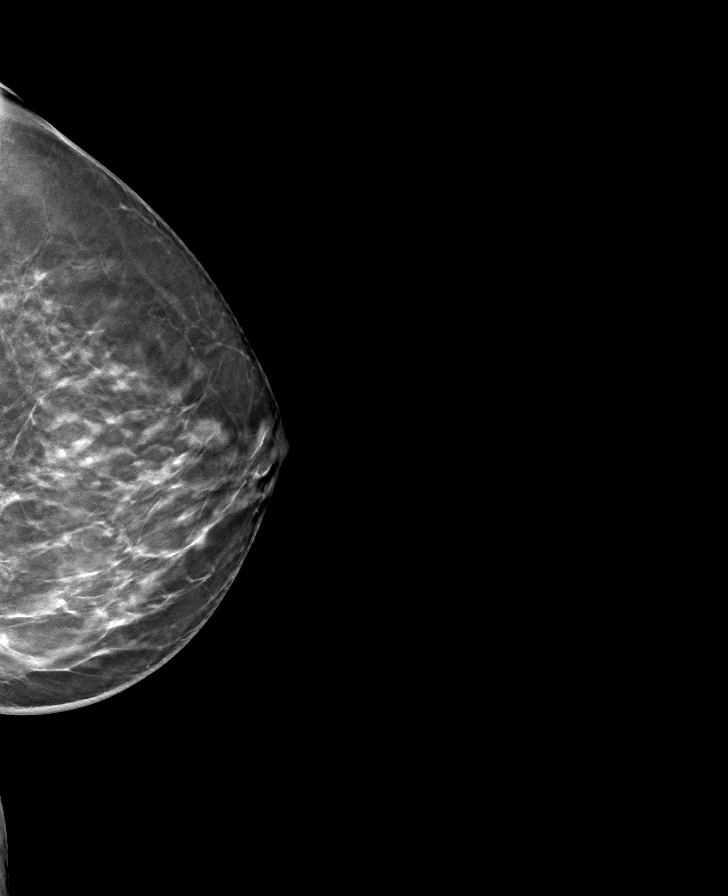

[8 of 24 positions shown; findings below may reference images not displayed]

ACR Breast Density Category c: The breast tissue is heterogeneously
dense, which may obscure small masses.
FINDINGS: There are no findings suspicious for malignancy. Images were
processed with CAD.
IMPRESSION: No mammographic evidence of malignancy. A result letter of this
screening mammogram will be mailed directly to the patient.

RECOMMENDATION:
Screening mammogram in one year. (Code:[id number])

BI-RADS CATEGORY  1: Negative.

## 2022-10-23 ENCOUNTER — Ambulatory Visit (INDEPENDENT_AMBULATORY_CARE_PROVIDER_SITE_OTHER): Payer: Medicare Other | Admitting: Neurology

## 2022-10-23 ENCOUNTER — Encounter: Payer: Self-pay | Admitting: Neurology

## 2022-10-23 DIAGNOSIS — G2581 Restless legs syndrome: Secondary | ICD-10-CM | POA: Diagnosis not present

## 2022-10-23 MED ORDER — ROPINIROLE HCL 3 MG PO TABS: 3.0000 mg | ORAL_TABLET | Freq: Every day | ORAL | 3 refills | Status: DC

## 2022-10-23 NOTE — Progress Notes (Signed)
Follow-up Visit   Date: 10/23/22    Rachel Daniel MRN: [id number] DOB: 09-Mar-1951   Interim History: Rachel Daniel is a 72 y.o. right-handed Caucasian female with diabetes mellitus (HbA1c 6.5), and hypertension returning to the clinic for follow-up of restless leg syndrome.  The patient was accompanied to the clinic by self.  IMPRESSION/PLAN: Restless leg syndrome, well-controlled   - Continue ropinirole '1mg'$  at noon and '1mg'$  at 5p, and '3mg'$  at bedtime  - She will request future refills from PCP as symptoms are well-controlled   Return to clinic as needed  ----------------------------------------------- History of present illness: Starting around her 30s, she recalls having achy pain of the right thigh, which would prohibit her from falling asleep.  Her husband often complained that she would kick him all night long.  She has squeezing, stabbing, knife-like pain of bilateral thighs. The lower legs and feet are spared.  She only has relief with walking and stretching.  Pain was severe at night time, but over the past several years started having daytime symptoms and she cannot get relief with anything. Her pain is better on the weekends when she is less sedentary.  She was diagnosed with RLS in her 72s and started on ropinorole and takes '3mg'$  at bedtime which provides relief until lunch time.  She is unable to take ropinorole during the day because of sedation.  She is intolerant to Lyrica.  She denies any weakness.    UPDATE 12//13/2021  She is here for 1 year follow-up.  She is doing well on ropinirole 0.'5mg'$  at noon, 0.'5mg'$  at 4pm, and '3mg'$  at bedtime.  She continues to have some restless sensation in the afternoon, which she manages with walking around.  Symptoms are not bothersome enough to increase the dose of her medication.  Nighttime symptoms are well-controlled and she does not wake up at nighttime.  She usually drinks coffee in the morning and enjoys tea later in the day.    UPDATE 09/24/2021:  She is here for follow-up visit. She continues to take ropinirole 0.'5mg'$  at noon, 0.'5mg'$  at 4p,and '3mg'$  at bedtime; however, reports having breakthrough symptoms in the evening before her night time dose. She tries to walk around and tried taking the bedtime dose earlier, but it makes her very sleepy.  UPDATE 10/23/2022:  Her PCP increased ropinirole to '1mg'$  at noon and 5pm, which has significantly improved her RLS.  Sometimes, it can make her sleepy, but most of the time she tolerates it well.  No interval falls or hospitalizations.    Medications:  Current Outpatient Medications on File Prior to Visit  Medication Sig Dispense Refill   aspirin EC 81 MG tablet Take 81 mg by mouth daily.     calcium carbonate (OS-CAL) 600 MG tablet Take by mouth.     CRANBERRY EXTRACT PO Take 2 tablets by mouth daily.     FIBER ADULT GUMMIES PO Take by mouth daily.     glipiZIDE (GLUCOTROL) 5 MG tablet TAKE 1/2 TO 1 TABLET BY MOUTH THREE TIMES DAILY WITH MEALS( FOR DIABETES) 270 tablet 1   lisinopril (ZESTRIL) 20 MG tablet Take 1 tablet (20 mg total) by mouth daily. for blood pressure 90 tablet 3   Multiple Vitamins-Minerals (MULTIVITAMIN PO) Take by mouth daily.     OVER THE COUNTER MEDICATION Uses OTC eye drops for dry eyes.     Phenazopyridine HCl (AZO TABS PO) Take 2 tablets by mouth daily.     rOPINIRole (REQUIP)  1 MG tablet Take  1 tablet  3 x /day  with Meals  for Restless Legs (Patient taking differently: Take  1 tablet  2 x /day  with Meals  for Restless Legs) 270 tablet 3   rOPINIRole (REQUIP) 3 MG tablet TAKE 1 TABLET BY MOUTH AT BEDTIME FOR RESTLESS LEGS 90 tablet 0   VITAMIN D, CHOLECALCIFEROL, PO Take 4,000 Units by mouth daily.     No current facility-administered medications on file prior to visit.    Allergies:  Allergies  Allergen Reactions   Hctz [Hydrochlorothiazide]     Leg cramping   Metformin And Related Nausea Only   Phenylbutazones Swelling   Tropicamide  Swelling    Red /itching/watery eyes    Vital Signs:  BP 125/76   Pulse 92   Ht '5\' 5"'$  (1.651 m)   Wt 182 lb (82.6 kg)   SpO2 97%   BMI 30.29 kg/m     Neurological Exam: MENTAL STATUS including orientation to time, place, person, and recent and remote memory is normal.  Speech is not dysarthric.  CRANIAL NERVES:  Extraocular muscles intact  MOTOR:  Motor strength is 5/5 throughout. No atrophy, fasciculations or abnormal movements.  No pronator drift.  Tone is normal.    MSRs:  Reflexes are 2+/4, except absent right Achilles  SENSORY:  Vibration intact throughout  COORDINATION/GAIT:  Gait narrow based and stable.   Data: Lab Results  Component Value Date   FERRITIN 100 10/10/2016   Lab Results  Component Value Date   TSH 1.29 08/06/2022   Lab Results  Component Value Date   [id number] 04/06/2015     Thank you for allowing me to participate in patient's care.  If I can answer any additional questions, I would be pleased to do so.    Sincerely,    Kripa Foskey K. Posey Pronto, DO

## 2022-10-23 NOTE — Patient Instructions (Signed)
It was great to see you today!  Continue taking ropinirole as you are taking  Return to clinic as needed

## 2022-12-10 ENCOUNTER — Other Ambulatory Visit: Payer: Self-pay

## 2022-12-10 MED ORDER — LISINOPRIL 20 MG PO TABS: 20.0000 mg | ORAL_TABLET | Freq: Every day | ORAL | 3 refills | Status: DC

## 2022-12-17 ENCOUNTER — Ambulatory Visit (INDEPENDENT_AMBULATORY_CARE_PROVIDER_SITE_OTHER): Payer: Medicare Other | Admitting: Nurse Practitioner

## 2022-12-17 ENCOUNTER — Encounter: Payer: Self-pay | Admitting: Nurse Practitioner

## 2022-12-17 VITALS — BP 130/70 | HR 73 | Temp 97.0°F | Ht 65.0 in | Wt 181.2 lb

## 2022-12-17 DIAGNOSIS — E1142 Type 2 diabetes mellitus with diabetic polyneuropathy: Secondary | ICD-10-CM

## 2022-12-17 DIAGNOSIS — Z0000 Encounter for general adult medical examination without abnormal findings: Secondary | ICD-10-CM

## 2022-12-17 DIAGNOSIS — Z0001 Encounter for general adult medical examination with abnormal findings: Secondary | ICD-10-CM | POA: Diagnosis not present

## 2022-12-17 DIAGNOSIS — K2100 Gastro-esophageal reflux disease with esophagitis, without bleeding: Secondary | ICD-10-CM

## 2022-12-17 DIAGNOSIS — N183 Chronic kidney disease, stage 3 unspecified: Secondary | ICD-10-CM | POA: Diagnosis not present

## 2022-12-17 DIAGNOSIS — E1169 Type 2 diabetes mellitus with other specified complication: Secondary | ICD-10-CM

## 2022-12-17 DIAGNOSIS — Z79899 Other long term (current) drug therapy: Secondary | ICD-10-CM | POA: Diagnosis not present

## 2022-12-17 DIAGNOSIS — E669 Obesity, unspecified: Secondary | ICD-10-CM | POA: Diagnosis not present

## 2022-12-17 DIAGNOSIS — E785 Hyperlipidemia, unspecified: Secondary | ICD-10-CM | POA: Diagnosis not present

## 2022-12-17 DIAGNOSIS — I10 Essential (primary) hypertension: Secondary | ICD-10-CM

## 2022-12-17 DIAGNOSIS — R6889 Other general symptoms and signs: Secondary | ICD-10-CM

## 2022-12-17 DIAGNOSIS — K5730 Diverticulosis of large intestine without perforation or abscess without bleeding: Secondary | ICD-10-CM | POA: Diagnosis not present

## 2022-12-17 DIAGNOSIS — E559 Vitamin D deficiency, unspecified: Secondary | ICD-10-CM

## 2022-12-17 DIAGNOSIS — N1831 Chronic kidney disease, stage 3a: Secondary | ICD-10-CM | POA: Diagnosis not present

## 2022-12-17 DIAGNOSIS — E1122 Type 2 diabetes mellitus with diabetic chronic kidney disease: Secondary | ICD-10-CM | POA: Diagnosis not present

## 2022-12-17 DIAGNOSIS — G2581 Restless legs syndrome: Secondary | ICD-10-CM | POA: Diagnosis not present

## 2022-12-17 NOTE — Patient Instructions (Signed)

## 2022-12-17 NOTE — Progress Notes (Signed)
AWV and FOLLOW UP  Assessment / Plan:   Annual Medicare Wellness Visit Due annually  Health maintenance reviewed  Essential hypertension, benign Controlled Continue Lisinopril  Discussed DASH (Dietary Approaches to Stop Hypertension) DASH diet is lower in sodium than a typical American diet. Cut back on foods that are high in saturated fat, cholesterol, and trans fats. Eat more whole-grain foods, fish, poultry, and nuts Remain active and exercise as tolerated daily.  Monitor BP at home-Call if greater than 130/80.  Check CMP/CBC   Restless leg syndrome Controlled  Continue requip - cleared by Neurology - will start to manage Rx refill.   Hyperlipidemia associated with DM Adamantly declines statins;  Continue Zetia  Discussed lifestyle modifications. Recommended diet heavy in fruits and veggies, omega 3's. Decrease consumption of animal meats, cheeses, and dairy products. Remain active and exercise as tolerated. Continue to monitor. Check lipids/TSH    Diverticulosis of large intestine without hemorrhage Controlled Push fluids High fiber diet  Vitamin D deficiency At goal at recent check; continue to recommend supplementation for goal of 60-100 Monitor vitamin d levels   Medication management All medications discussed and reviewed in full. All questions and concerns regarding medications addressed.    Gastroesophageal reflux disease with esophagitis Colonoscopy UTD No suspected reflux complications ([place]/stricture). Lifestyle modification:  wt loss, avoid meals 2-3h before bedtime. Consider eliminating food triggers:  chocolate, caffeine, EtOH, acid/spicy food.   Diabetic poly neuropathy associated with type 2 diabetes mellitus Check feet regularly; control sugars Continue Requip Continue to monitor  Type 2 diabetes mellitus with stage 2 chronic kidney disease, without long-term current use of insulin (HCC) A1c trending up Continue  Glipizide Education: Reviewed 'ABCs' of diabetes management  Discussed goals to be met and/or maintained include A1C (<7) Blood pressure (<130/80) Cholesterol (LDL <70) Continue Eye Exam yearly  Continue Dental Exam Q6 mo Discussed dietary recommendations Discussed Physical Activity recommendations Check A1C   CKD stage 2 due to type 2 diabetes mellitus ([place]) Discussed how what you eat and drink can aide in kidney protection. Stay well hydrated. Avoid high salt foods. Avoid NSAIDS. Keep BP and BG well controlled.   Take medications as prescribed. Remain active and exercise as tolerated daily. Maintain weight.  Continue to monitor. Check CMP/GFR/Microablumin  Overweight (BMI 30-34) Discussed appropriate BMI Diet modification. Physical activity. Encouraged/praised to build confidence.   Orders Placed This Encounter  Procedures   CBC with Differential/Platelet   COMPLETE METABOLIC PANEL WITH GFR   Lipid panel   Hemoglobin A1c   VITAMIN D 25 Hydroxy (Vit-D Deficiency, Fractures)    Notify office for further evaluation and treatment, questions or concerns if any reported s/s fail to improve.   The patient was advised to call back or seek an in-person evaluation if any symptoms worsen or if the condition fails to improve as anticipated.   Further disposition pending results of labs. Discussed med's effects and SE's.    I discussed the assessment and treatment plan with the patient. The patient was provided an opportunity to ask questions and all were answered. The patient agreed with the plan and demonstrated an understanding of the instructions.  Discussed med's effects and SE's. Screening labs and tests as requested with regular follow-up as recommended.  I provided 35 minutes of face-to-face time during this encounter including counseling, chart review, and critical decision making was preformed.  Today's Plan of Care is based on a patient-centered health care  approach known as shared decision making - the decisions, tests  and treatments allow for patient preferences and values to be balanced with clinical evidence.    Future Appointments  Date Time Provider [place]  12/30/2022  2:00 PM Bernarda Caffey, MD [facility] None  05/05/2023 10:00 AM Alycia Rossetti, NP [facility] None  12/17/2023  3:00 PM Darrol Jump, NP [facility] None    Plan:   During the course of the visit the patient was educated and counseled about appropriate screening and preventive services including:   Pneumococcal vaccine  Prevnar 13 Influenza vaccine Td vaccine Screening electrocardiogram Bone densitometry screening Colorectal cancer screening Diabetes screening Glaucoma screening Nutrition counseling  Advanced directives: requested  ______________________________________________________________________  HPI 72 y.o. female  presents for a 3 month follow up and AWV. She has Diverticulosis of large intestine; Restless leg syndrome; Hyperlipidemia associated with type 2 diabetes mellitus ([place]); Vitamin D deficiency; Essential hypertension; Diabetic neuropathy ([place]); Medication management; Obesity (BMI 30.0-34.9); Type 2 diabetes mellitus with stage 3a chronic kidney disease, without long-term current use of insulin ([place]); Osteopenia; and CKD stage 3 due to type 2 diabetes mellitus ([place]) on their problem list.  Overall she reports feeling well today.  She has no new concerns to present during this visit.   Continues to remain compliant with medications.  She had had bilateral leg swelling for years, left leg always worse than the right. No warmth, no pain, no redness. Does compression intermittently, doesn't tolerate diuretics. She is on requip for RLS which helps, she has recently followed with Dr. Posey Pronto, Neurology but shares with me today that she has been cleared of management.  She takes Requp 3 mg HS and 0.5 mg during the day.  Topical aspercreme with  benefit. She was prescribed gabapentin 100 mg but she is no longer taking.   BMI is Body mass index is 30.15 kg/m., she has been working on diet and exercise. Wt Readings from Last 3 Encounters:  12/17/22 181 lb 3.2 oz (82.2 kg)  10/23/22 182 lb (82.6 kg)  08/06/22 181 lb 12.8 oz (82.5 kg)   She does not check BP at home due to always been steady, today their BP is BP: 130/70 She does not workout. She denies chest pain,  dizziness. She will have some SOB with dragging the trash can up hill in her driveway of [id number] ft, no CP with it, no dizziness, nausea or fatigue. Feels as though this is stable.  She is not on cholesterol medication, she absolutely declines any statins due to her husband's very poor experience, she is on zetia 10 mg daily. Her cholesterol is not at goal of LDL <70. The cholesterol last visit was:   Lab Results  Component Value Date   CHOL 172 08/06/2022   HDL 40 (L) 08/06/2022   LDLCALC 100 (H) 08/06/2022   TRIG 199 (H) 08/06/2022   CHOLHDL 4.3 08/06/2022   She has been working on diet and exercise for diabetes.  A1c is trending up.  Admits to eating ice cream cake at sons birthday party yesterday. CKD stage III on ACE - lisinopril 10 mg  Neuropathy Hyperlipidemia - declines statin, on zetia  Eye exam 12/2020 no retinopathy, has upcoming with Dr. Coralyn Pear follows yearly, has mild cataracts. Glipizide - 1 tab TID except will skip if walking  could not tolerate the metformin farxiga was too expensive but did help with swelling in her legs denies polydipsia, polyuria and visual disturbances.  She has glucometer but doesn't check Last A1C in the office was:  Lab Results  Component Value Date   HGBA1C 7.8 (H) 08/06/2022    Lab Results  Component Value Date   EGFR 46 (L) 08/06/2022   EGFR 51 (L) 05/02/2022   EGFR 50 (L) 12/12/2021   Patient is on Vitamin D supplement, 4000 IU daily   Lab Results  Component Value Date   VD25OH 75 08/06/2022    Current  Medications:   Current Outpatient Medications (Endocrine & Metabolic):    glipiZIDE (GLUCOTROL) 5 MG tablet, TAKE 1/2 TO 1 TABLET BY MOUTH THREE TIMES DAILY WITH MEALS( FOR DIABETES)  Current Outpatient Medications (Cardiovascular):    lisinopril (ZESTRIL) 20 MG tablet, Take 1 tablet (20 mg total) by mouth daily. for blood pressure   Current Outpatient Medications (Analgesics):    aspirin EC 81 MG tablet, Take 81 mg by mouth daily.   Current Outpatient Medications (Other):    calcium carbonate (OS-CAL) 600 MG tablet, Take by mouth.   CRANBERRY EXTRACT PO, Take 2 tablets by mouth daily.   FIBER ADULT GUMMIES PO, Take by mouth daily.   Multiple Vitamins-Minerals (MULTIVITAMIN PO), Take by mouth daily.   OVER THE COUNTER MEDICATION, Uses OTC eye drops for dry eyes.   Phenazopyridine HCl (AZO TABS PO), Take 2 tablets by mouth daily.   rOPINIRole (REQUIP) 1 MG tablet, Take  1 tablet  3 x /day  with Meals  for Restless Legs (Patient taking differently: Take  1 tablet  2 x /day  with Meals  for Restless Legs)   rOPINIRole (REQUIP) 3 MG tablet, Take 1 tablet (3 mg total) by mouth at bedtime.   VITAMIN D, CHOLECALCIFEROL, PO, Take 4,000 Units by mouth daily.   Health Maintenance:   Immunization History  Administered Date(s) Administered   Influenza, High Dose Seasonal PF 08/01/2017, 09/22/2018, 07/20/2019, 08/06/2022   Moderna SARS-COV2 Booster Vaccination 01/12/2021   Moderna Sars-Covid-2 Vaccination 01/18/2020, 02/15/2020, 08/18/2020   PFIZER Comirnaty(Gray Top)Covid-19 Tri-Sucrose Vaccine 07/06/2021   Pneumococcal Conjugate-13 04/05/2016   Pneumococcal Polysaccharide-23 04/15/2017   Tdap 03/07/2011   Health Maintenance  Topic Date Due   Zoster Vaccines- Shingrix (1 of 2) Never done   DTaP/Tdap/Td (2 - Td or Tdap) 03/06/2021   OPHTHALMOLOGY EXAM  12/20/2021   FOOT EXAM  05/02/2022   COVID-19 Vaccine (5 - 2023-24 season) 06/14/2022   DEXA SCAN  08/10/2022   HEMOGLOBIN A1C   02/05/2023   Diabetic kidney evaluation - Urine ACR  05/03/2023   Diabetic kidney evaluation - eGFR measurement  08/07/2023   MAMMOGRAM  08/17/2023   Medicare Annual Wellness (AWV)  12/17/2023   COLONOSCOPY (Pts 45-42yr Insurance coverage will need to be confirmed)  07/12/2026   Pneumonia Vaccine 72 Years old  Completed   INFLUENZA VACCINE  Completed   Hepatitis C Screening  Completed   HPV VACCINES  Aged Out   Tetanus: 2012 - will boost PRN due to cost Shingrix: declines  Pap: 2016 negative, DONE MGM:  annually at breast center 08/2022  DEXA: 07/2020 R fem T -1.7, osteopenia, will follow up in 2-3 years She reports repeating in 08/2022 but no results on file.  Colonoscopy: 06/2016, Dr. KDeatra Ina 10 years, family hx of colon cancer EGD: N/A  Eye exam: Dr. ZCoralyn Pear yearly last 2023 Dentist: Dr. RHarrington Challengerq 6 months last 2023  Patient Care Team: MUnk Pinto MD as PCP - General (Internal Medicine) MUnk Pinto MD as PCP - Internal Medicine (Internal Medicine) DPhylliss Bob MD as Consulting Physician (Orthopedic Surgery) PAlda Berthold DO as Consulting Physician (Neurology)  Allergies:  Allergies  Allergen Reactions   Hctz [Hydrochlorothiazide]     Leg cramping   Metformin And Related Nausea Only   Phenylbutazones Swelling   Tropicamide Swelling    Red /itching/watery eyes   Medical History:  Past Medical History:  Diagnosis Date   Allergy    otc med prn   Arthritis    hips, lower back   Cataract    bilateral   Chronic kidney disease    stage 3 per patient    Diet-controlled type 2 diabetes mellitus ([place])    Diet controlled, no meds   Diverticulosis    Dribbling urine    EKG abnormalities    pt states she had a BBB on her last EKG, no problems   Essential hypertension, benign 08/19/2013   HPV in female    Hyperlipidemia    diet controlled, no med   Restless leg syndrome    Snoring    Vitamin D deficiency    Surgical History:  She  has a past  surgical history that includes Vaginal hysterectomy (1992); Tubal ligation (1981); Ovarian cyst removal (1981); Lumbar laminectomy/decompression microdiscectomy (05/07/2012); Colonoscopy (2012); and Breast biopsy (Left, 1969).  Family History:  Her family history includes Atrial fibrillation in her brother, brother, sister, sister, and sister; Cataracts in her mother; Colon cancer (age of onset: 56) in her mother; Colon polyps in her brother and sister; Diabetes in her mother, paternal grandfather, sister, and sister; Heart failure in her mother; Heart failure (age of onset: 74) in her father; Hypertension in her mother; Kidney disease in her father; Stroke in her mother; Stroke (age of onset: 50) in her sister.   Social History:  She  reports that she has never smoked. She has been exposed to tobacco smoke. She has never used smokeless tobacco. She reports that she does not drink alcohol and does not use drugs.   MEDICARE WELLNESS OBJECTIVES: Physical activity:   Cardiac risk factors:   Depression/mood screen:      12/17/2022    3:32 PM  Depression screen PHQ 2/9  Decreased Interest 0  Down, Depressed, Hopeless 0  PHQ - 2 Score 0    ADLs:     12/17/2022    3:29 PM 08/05/2022   11:19 PM  In your present state of health, do you have any difficulty performing the following activities:  Hearing? 0 0  Vision? 0 0  Difficulty concentrating or making decisions? 0 0  Walking or climbing stairs? 0 0  Dressing or bathing? 0 0  Doing errands, shopping? 0 0  Preparing Food and eating ? N   Using the Toilet? N   In the past six months, have you accidently leaked urine? N   Do you have problems with loss of bowel control? N   Managing your Medications? N   Managing your Finances? N   Housekeeping or managing your Housekeeping? N      Cognitive Testing  Alert? Yes  Normal Appearance?Yes  Oriented to person? Yes  Place? Yes   Time? Yes  Recall of three objects?  Yes  Can perform simple  calculations? Yes  Displays appropriate judgment?Yes  Can read the correct time from a watch face?Yes  EOL planning: Does Patient Have a Medical Advance Directive?: Yes Type of Advance Directive: Living will     Review of Systems  Constitutional: Negative.  Negative for malaise/fatigue and weight loss.  HENT: Negative.  Negative for hearing loss and tinnitus.   Eyes: Negative.  Negative for blurred vision and double vision.  Respiratory:  Negative for cough, hemoptysis, sputum production, shortness of breath and wheezing.   Cardiovascular: Negative.  Negative for chest pain, palpitations, orthopnea, claudication, leg swelling and PND.  Gastrointestinal: Negative.  Negative for abdominal pain, blood in stool, constipation, diarrhea, heartburn, melena, nausea and vomiting.  Genitourinary: Negative.   Musculoskeletal:  Positive for myalgias (bil thighs). Negative for back pain, falls, joint pain and neck pain.  Skin:  Negative for itching and rash.  Neurological:  Negative for dizziness, tingling, tremors, sensory change, weakness and headaches.  Endo/Heme/Allergies:  Negative for polydipsia.  Psychiatric/Behavioral: Negative.  Negative for depression, memory loss, substance abuse and suicidal ideas. The patient is not nervous/anxious and does not have insomnia.   All other systems reviewed and are negative.   Physical Exam: Estimated body mass index is 30.15 kg/m as calculated from the following:   Height as of this encounter: '5\' 5"'$  (1.651 m).   Weight as of this encounter: 181 lb 3.2 oz (82.2 kg). BP 130/70   Pulse 73   Temp (!) 97 F (36.1 C)   Ht '5\' 5"'$  (1.651 m)   Wt 181 lb 3.2 oz (82.2 kg)   SpO2 98%   BMI 30.15 kg/m    General Appearance: Well nourished, in no apparent distress. Eyes: PERRLA, EOMs, conjunctiva no swelling or erythema Sinuses: No Frontal/maxillary tenderness ENT/Mouth: Ext aud canals bil obstructed by dru wax.  Good dentition. No erythema, swelling, or  exudate on post pharynx. Tonsils not swollen or erythematous. Hearing normal.  Neck: Supple, thyroid normal. No bruits Respiratory: Respiratory effort normal, BS equal bilaterally without rales, rhonchi, wheezing or stridor. Cardio: RRR without murmurs, rubs or gallops. Brisk peripheral pulses without edema.  Chest: symmetric, with normal excursions and percussion. Breasts: defer, getting annual mammograms, no concerns Abdomen: Soft, +BS, Non tender, no guarding, rebound, hernias, masses, or organomegaly. .  Lymphatics: Non tender without lymphadenopathy.  Musculoskeletal: Full ROM all peripheral extremities,5/5 strength, and normal gait.  Skin: + varicose veins bilateral legs. Warm, dry without rashes, lesions, ecchymosis.  Neuro: Cranial nerves intact, reflexes equal bilaterally. Normal muscle tone, no cerebellar symptoms. Sensation intact Psych: Awake and oriented X 3, normal affect, Insight and Judgment appropriate.    Medicare Attestation I have personally reviewed: The patient's medical and social history Their use of alcohol, tobacco or illicit drugs Their current medications and supplements The patient's functional ability including ADLs,fall risks, home safety risks, cognitive, and hearing and visual impairment Diet and physical activities Evidence for depression or mood disorders  The patient's weight, height, BMI, and visual acuity have been recorded in the chart.  I have made referrals, counseling, and provided education to the patient based on review of the above and I have provided the patient with a written personalized care plan for preventive services.     Darrol Jump, NP 3:51 PM [facility]

## 2022-12-18 LAB — COMPLETE METABOLIC PANEL WITH GFR
AG Ratio: 1.3 (calc) (ref 1.0–2.5)
ALT: 21 U/L (ref 6–29)
AST: 17 U/L (ref 10–35)
Albumin: 4.3 g/dL (ref 3.6–5.1)
Alkaline phosphatase (APISO): 74 U/L (ref 37–153)
BUN/Creatinine Ratio: 14 (calc) (ref 6–22)
BUN: 15 mg/dL (ref 7–25)
CO2: 28 mmol/L (ref 20–32)
Calcium: 9.7 mg/dL (ref 8.6–10.4)
Chloride: 102 mmol/L (ref 98–110)
Creat: 1.05 mg/dL — ABNORMAL HIGH (ref 0.60–1.00)
Globulin: 3.4 g/dL (calc) (ref 1.9–3.7)
Glucose, Bld: 168 mg/dL — ABNORMAL HIGH (ref 65–99)
Potassium: 4.3 mmol/L (ref 3.5–5.3)
Sodium: 137 mmol/L (ref 135–146)
Total Bilirubin: 0.3 mg/dL (ref 0.2–1.2)
Total Protein: 7.7 g/dL (ref 6.1–8.1)
eGFR: 56 mL/min/{1.73_m2} — ABNORMAL LOW (ref >=60)

## 2022-12-18 LAB — CBC WITH DIFFERENTIAL/PLATELET
Absolute Monocytes: 382 cells/uL (ref 200–950)
Basophils Absolute: 40 cells/uL (ref 0–200)
Basophils Relative: 0.7 %
Eosinophils Absolute: 131 cells/uL (ref 15–500)
Eosinophils Relative: 2.3 %
HCT: 42.9 % (ref 35.0–45.0)
Hemoglobin: 14.3 g/dL (ref 11.7–15.5)
Lymphs Abs: 2029 cells/uL (ref 850–3900)
MCH: 29.2 pg (ref 27.0–33.0)
MCHC: 33.3 g/dL (ref 32.0–36.0)
MCV: 87.6 fL (ref 80.0–100.0)
MPV: 10 fL (ref 7.5–12.5)
Monocytes Relative: 6.7 %
Neutro Abs: 3118 cells/uL (ref 1500–7800)
Neutrophils Relative %: 54.7 %
Platelets: 188 10*3/uL (ref 140–400)
RBC: 4.9 10*6/uL (ref 3.80–5.10)
RDW: 12.2 % (ref 11.0–15.0)
Total Lymphocyte: 35.6 %
WBC: 5.7 10*3/uL (ref 3.8–10.8)

## 2022-12-18 LAB — LIPID PANEL
Cholesterol: 179 mg/dL (ref <200)
HDL: 42 mg/dL — ABNORMAL LOW (ref >=50)
LDL Cholesterol (Calc): 113 mg/dL (calc) — ABNORMAL HIGH
Non-HDL Cholesterol (Calc): 137 mg/dL (calc) — ABNORMAL HIGH (ref <130)
Total CHOL/HDL Ratio: 4.3 (calc) (ref <5.0)
Triglycerides: 125 mg/dL (ref <150)

## 2022-12-18 LAB — HEMOGLOBIN A1C
Hgb A1c MFr Bld: 8.6 % of total Hgb — ABNORMAL HIGH (ref <5.7)
Mean Plasma Glucose: 200 mg/dL
eAG (mmol/L): 11.1 mmol/L

## 2022-12-18 LAB — VITAMIN D 25 HYDROXY (VIT D DEFICIENCY, FRACTURES): Vit D, 25-Hydroxy: 85 ng/mL (ref 30–100)

## 2022-12-18 NOTE — Progress Notes (Signed)
[facility] Note  12/30/2022     CHIEF COMPLAINT Patient presents for Retina Follow Up   HISTORY OF PRESENT ILLNESS: Rachel Daniel is a 72 y.o. female who presents to the clinic today for:   HPI     Retina Follow Up   Patient presents with  Diabetic Retinopathy.  In both eyes.  This started years ago.  Duration of 1 year.  Since onset it is stable.  I, the attending physician,  performed the HPI with the patient and updated documentation appropriately.        Comments   Patient feels that the vision is blurry a lot. She is complaining that the dilation drops give her red eyes for a week. She is using AT's OU PRN. She does not check her blood sugar daily.       Last edited by Bernarda Caffey, MD on 01/02/2023  2:30 PM.    Pt states vision is getting "fuzzier", she states it's mostly when she is looking down to crochet and then looks up, pts A1c was 8.6 on March 5th, she is on glipizide, she states she is doing "horrible" on her diet  Referring physician: Unk Pinto, MD [id number] [place],  [place] [address]  HISTORICAL INFORMATION:   Selected notes from the [facility] Referred by A. Corbett, NP (internal med) for DM exam;   LEE- unknown - saw Dr. Zigmund Daniel on 08.07.17, BCVA at that time OD: 20/20 OS: 20/20 Ocular Hx- cataract OU PMH- DM, arthritis, CKD, HTN    CURRENT MEDICATIONS: No current outpatient medications on file. (Ophthalmic Drugs)   No current facility-administered medications for this visit. (Ophthalmic Drugs)   Current Outpatient Medications (Other)  Medication Sig   aspirin EC 81 MG tablet Take 81 mg by mouth daily.   calcium carbonate (OS-CAL) 600 MG tablet Take by mouth.   CRANBERRY EXTRACT PO Take 2 tablets by mouth daily.   FIBER ADULT GUMMIES PO Take by mouth daily.   glipiZIDE (GLUCOTROL) 5 MG tablet TAKE 1/2 TO 1 TABLET BY MOUTH THREE TIMES DAILY WITH MEALS( FOR DIABETES)    lisinopril (ZESTRIL) 20 MG tablet Take 1 tablet (20 mg total) by mouth daily. for blood pressure   Multiple Vitamins-Minerals (MULTIVITAMIN PO) Take by mouth daily.   OVER THE COUNTER MEDICATION Uses OTC eye drops for dry eyes.   Phenazopyridine HCl (AZO TABS PO) Take 2 tablets by mouth daily.   rOPINIRole (REQUIP) 1 MG tablet Take  1 tablet  3 x /day  with Meals  for Restless Legs (Patient taking differently: Take  1 tablet  2 x /day  with Meals  for Restless Legs)   rOPINIRole (REQUIP) 3 MG tablet Take 1 tablet (3 mg total) by mouth at bedtime.   VITAMIN D, CHOLECALCIFEROL, PO Take 4,000 Units by mouth daily.   No current facility-administered medications for this visit. (Other)   REVIEW OF SYSTEMS: ROS   Positive for: Endocrine, Eyes Negative for: Constitutional, Gastrointestinal, Neurological, Skin, Genitourinary, Musculoskeletal, HENT, Cardiovascular, Respiratory, Psychiatric, Allergic/Imm, Heme/Lymph Last edited by Annie Paras, COT on 12/30/2022  1:54 PM.      ALLERGIES Allergies  Allergen Reactions   Hctz [Hydrochlorothiazide]     Leg cramping   Metformin And Related Nausea Only   Phenylbutazones Swelling   Tropicamide Swelling    Red /itching/watery eyes   PAST MEDICAL HISTORY Past Medical History:  Diagnosis Date   Allergy  otc med prn   Arthritis    hips, lower back   Cataract    bilateral   Chronic kidney disease    stage 3 per patient    Diet-controlled type 2 diabetes mellitus ([place])    Diet controlled, no meds   Diverticulosis    Dribbling urine    EKG abnormalities    pt states she had a BBB on her last EKG, no problems   Essential hypertension, benign 08/19/2013   HPV in female    Hyperlipidemia    diet controlled, no med   Restless leg syndrome    Snoring    Vitamin D deficiency    Past Surgical History:  Procedure Laterality Date   BREAST BIOPSY Left 1969   left lumpectomy - benign   COLONOSCOPY  2012   kaplan-polyps, tics   LUMBAR  LAMINECTOMY/DECOMPRESSION MICRODISCECTOMY  05/07/2012   Procedure: LUMBAR LAMINECTOMY/DECOMPRESSION MICRODISCECTOMY;  Surgeon: Sinclair Ship, MD;  Location: [facility];  Service: Orthopedics;  Laterality: Bilateral;  Lumbar 4-5 decompression   OVARIAN CYST REMOVAL  1981   right   TUBAL LIGATION  1981   VAGINAL HYSTERECTOMY  1992   FAMILY HISTORY Family History  Problem Relation Age of Onset   Hypertension Mother    Diabetes Mother    Stroke Mother    Heart failure Mother    Colon cancer Mother 47   Cataracts Mother    Heart failure Father 67   Kidney disease Father    Stroke Sister 72   Colon polyps Sister    Atrial fibrillation Sister    Diabetes Sister    Atrial fibrillation Sister    Diabetes Sister    Atrial fibrillation Sister    Colon polyps Brother    Atrial fibrillation Brother    Atrial fibrillation Brother    Diabetes Paternal Grandfather    Stomach cancer Neg Hx    Esophageal cancer Neg Hx    Rectal cancer Neg Hx    Amblyopia Neg Hx    Blindness Neg Hx    Glaucoma Neg Hx    Macular degeneration Neg Hx    Retinal detachment Neg Hx    Strabismus Neg Hx    Retinitis pigmentosa Neg Hx    Breast cancer Neg Hx    SOCIAL HISTORY Social History   Tobacco Use   Smoking status: Never    Passive exposure: Past   Smokeless tobacco: Never  Vaping Use   Vaping Use: Never used  Substance Use Topics   Alcohol use: No    Alcohol/week: 0.0 standard drinks of alcohol   Drug use: No       OPHTHALMIC EXAM:  Base Eye Exam     Visual Acuity (Snellen - Linear)       Right Left   Dist cc 20/30 +1 20/25 +2   Dist ph cc 20/25 +2     Correction: Glasses         Tonometry (Tonopen, 2:02 PM)       Right Left   Pressure 18 20         Pupils       Dark Light Shape React APD   Right 3 2 Round Brisk None   Left 3 2 Round Brisk None         Visual Fields       Left Right    Full Full         Extraocular Movement       Right  Left    Full,  Ortho Full, Ortho         Neuro/Psych     Oriented x3: Yes   Mood/Affect: Normal         Dilation     Both eyes: 2.5% Phenylephrine @ 2:03 PM           Slit Lamp and Fundus Exam     Slit Lamp Exam       Right Left   Lids/Lashes Dermatochalasis - upper lid Dermatochalasis - upper lid, Meibomian gland dysfunction   Conjunctiva/Sclera White and quiet White and quiet   Cornea Arcus, trace tear film debris Arcus, trace tear film debris   Anterior Chamber Deep, clear, narrow temporal angle deep and clear   Iris Round and dilated, No NVI Round and dilated, No NVI   Lens 2-3+ Nuclear sclerosis, 2-3+ Cortical cataract 2-3+ Nuclear sclerosis, 2-3+ Cortical cataract   Anterior Vitreous Vitreous syneresis mild syneresis, Posterior vitreous detachment         Fundus Exam       Right Left   Disc Pink and sharp, Mild Peripapillary pigmentation and atrophy, No NVD Pink and Sharp, PPA/PPP   C/D Ratio 0.5 0.2   Macula Flat, good foveal reflex, Retinal pigment epithelial mottling and clumping, No heme or edema Flat, Blunted foveal reflex, RPE mottling and clumping, No heme or edema   Vessels attenuated, mild tortuosity mild attenuation, mild tortuosity   Periphery Attached, No heme Attached, No heme           Refraction     Wearing Rx       Sphere Cylinder Axis   Right -2.25 +0.50 018   Left -2.00 +0.50 067           IMAGING AND PROCEDURES  Imaging and Procedures for 11/27/17  OCT, Retina - OU - Both Eyes       Right Eye Quality was good. Central Foveal Thickness: 226. Progression has been stable. Findings include normal foveal contour, no IRF, no SRF.   Left Eye Quality was good. Central Foveal Thickness: 235. Progression has been stable. Findings include normal foveal contour, no IRF, no SRF (Peripapillary ORA).   Notes *Images captured and stored on drive  Diagnosis / Impression: NFP, No IRF/SRF No DME OU OS: Peripapillary ORA  Clinical management:   See below  Abbreviations: NFP - Normal foveal profile. CME - cystoid macular edema. PED - pigment epithelial detachment. IRF - intraretinal fluid. SRF - subretinal fluid. EZ - ellipsoid zone. ERM - epiretinal membrane. ORA - outer retinal atrophy. ORT - outer retinal tubulation. SRHM - subretinal hyper-reflective material              ASSESSMENT/PLAN:    ICD-10-CM   1. Diabetes mellitus type 2 without retinopathy ([place])  E11.9     2. Posterior vitreous detachment of left eye  H43.812 OCT, Retina - OU - Both Eyes    3. Essential hypertension  I10     4. Hypertensive retinopathy of both eyes  H35.033     5. Combined forms of age-related cataract of both eyes  H25.813     6. Dry eyes  H04.123     7. Meibomian gland dysfunction (MGD) of both eyes  H02.883    H02.886       1. Diabetes mellitus, type 2 without retinopathy -- stable  - A1c: 8.6 on 03.05.24, 7.5 on 03.01.23, 7.3 on 02.22.22  - The incidence, risk factors for progression, natural history  and treatment options for diabetic retinopathy  were discussed with patient.    - The need for close monitoring of blood glucose, blood pressure, and serum lipids, avoiding cigarette or any type of tobacco, and the need for long term follow up was also discussed with patient.   - f/u in 1-2 years, sooner prn  2. PVD OS  Long-standing, floater OS -- unchanged  Discussed findings and prognosis  No RT or RD on 360 peripheral exam  Reviewed s/s of RT/RD  Strict return precautions for any such RT/RD symptoms  3,4. Hypertensive retinopathy OU - discussed importance of tight BP control - monitor  5. Combined form age-related cataract OU   - The symptoms of cataract, surgical options, and treatments and risks were discussed with patient.  - discussed diagnosis and progression  - will refer to Dr. Lucianne Lei for consult  6,7. Dry Eyes / MGD OU  - recommend artificial tears and lubricating ointment as needed   - recommend hot  compresses and lid scrubs  Ophthalmic Meds Ordered this visit:  No orders of the defined types were placed in this encounter.    Return in about 1 year (around 12/30/2023) for f/u DM exam, DFE, OCT.  There are no Patient Instructions on file for this visit.  Explained the diagnoses, plan, and follow up with the patient and they expressed understanding.  Patient expressed understanding of the importance of proper follow up care.   This document serves as a record of services personally performed by Gardiner Sleeper, MD, PhD. It was created on their behalf by Orvan Falconer, an ophthalmic technician. The creation of this record is the provider's dictation and/or activities during the visit.    Electronically signed by: Orvan Falconer, OA, 01/02/23  2:32 PM  This document serves as a record of services personally performed by Gardiner Sleeper, MD, PhD. It was created on their behalf by San Jetty. Owens Shark, OA an ophthalmic technician. The creation of this record is the provider's dictation and/or activities during the visit.    Electronically signed by: San Jetty. Owens Shark, [place] 03.18.2024 2:32 PM  Gardiner Sleeper, M.D., Ph.D. Diseases & Surgery of the Retina and Vitreous [facility]  I have reviewed the above documentation for accuracy and completeness, and I agree with the above. Gardiner Sleeper, M.D., Ph.D. 01/02/23 2:32 PM   Abbreviations: M myopia (nearsighted); A astigmatism; H hyperopia (farsighted); P presbyopia; Mrx spectacle prescription;  CTL contact lenses; OD right eye; OS left eye; OU both eyes  XT exotropia; ET esotropia; PEK punctate epithelial keratitis; PEE punctate epithelial erosions; DES dry eye syndrome; MGD meibomian gland dysfunction; ATs artificial tears; PFAT's preservative free artificial tears; [place] nuclear sclerotic cataract; PSC posterior subcapsular cataract; ERM epi-retinal membrane; PVD posterior vitreous detachment; RD retinal detachment; DM  diabetes mellitus; DR diabetic retinopathy; NPDR non-proliferative diabetic retinopathy; PDR proliferative diabetic retinopathy; CSME clinically significant macular edema; DME diabetic macular edema; dbh dot blot hemorrhages; CWS cotton wool spot; POAG primary open angle glaucoma; C/D cup-to-disc ratio; HVF humphrey visual field; GVF goldmann visual field; OCT optical coherence tomography; IOP intraocular pressure; BRVO Branch retinal vein occlusion; CRVO central retinal vein occlusion; CRAO central retinal artery occlusion; BRAO branch retinal artery occlusion; RT retinal tear; SB scleral buckle; PPV pars plana vitrectomy; VH Vitreous hemorrhage; PRP panretinal laser photocoagulation; IVK intravitreal kenalog; VMT vitreomacular traction; MH Macular hole;  NVD neovascularization of the disc; NVE neovascularization elsewhere; AREDS age related eye disease study; ARMD  age related macular degeneration; POAG primary open angle glaucoma; EBMD epithelial/anterior basement membrane dystrophy; ACIOL anterior chamber intraocular lens; IOL intraocular lens; PCIOL posterior chamber intraocular lens; Phaco/IOL phacoemulsification with intraocular lens placement; [place] photorefractive keratectomy; LASIK laser assisted in situ keratomileusis; HTN hypertension; DM diabetes mellitus; COPD chronic obstructive pulmonary disease

## 2022-12-30 ENCOUNTER — Ambulatory Visit (INDEPENDENT_AMBULATORY_CARE_PROVIDER_SITE_OTHER): Payer: Medicare Other | Admitting: Ophthalmology

## 2022-12-30 DIAGNOSIS — I10 Essential (primary) hypertension: Secondary | ICD-10-CM

## 2022-12-30 DIAGNOSIS — H35033 Hypertensive retinopathy, bilateral: Secondary | ICD-10-CM | POA: Diagnosis not present

## 2022-12-30 DIAGNOSIS — H43812 Vitreous degeneration, left eye: Secondary | ICD-10-CM | POA: Diagnosis not present

## 2022-12-30 DIAGNOSIS — H02883 Meibomian gland dysfunction of right eye, unspecified eyelid: Secondary | ICD-10-CM

## 2022-12-30 DIAGNOSIS — H25813 Combined forms of age-related cataract, bilateral: Secondary | ICD-10-CM

## 2022-12-30 DIAGNOSIS — H04123 Dry eye syndrome of bilateral lacrimal glands: Secondary | ICD-10-CM

## 2022-12-30 DIAGNOSIS — E119 Type 2 diabetes mellitus without complications: Secondary | ICD-10-CM

## 2023-01-02 ENCOUNTER — Encounter (INDEPENDENT_AMBULATORY_CARE_PROVIDER_SITE_OTHER): Payer: Self-pay | Admitting: Ophthalmology

## 2023-02-06 DIAGNOSIS — E119 Type 2 diabetes mellitus without complications: Secondary | ICD-10-CM | POA: Diagnosis not present

## 2023-02-06 DIAGNOSIS — H25813 Combined forms of age-related cataract, bilateral: Secondary | ICD-10-CM | POA: Diagnosis not present

## 2023-02-06 DIAGNOSIS — H40033 Anatomical narrow angle, bilateral: Secondary | ICD-10-CM | POA: Diagnosis not present

## 2023-02-06 DIAGNOSIS — H35033 Hypertensive retinopathy, bilateral: Secondary | ICD-10-CM | POA: Diagnosis not present

## 2023-04-04 ENCOUNTER — Other Ambulatory Visit: Payer: Self-pay | Admitting: Nurse Practitioner

## 2023-04-04 DIAGNOSIS — E1165 Type 2 diabetes mellitus with hyperglycemia: Secondary | ICD-10-CM

## 2023-05-05 ENCOUNTER — Encounter: Payer: Medicare Other | Admitting: Nurse Practitioner

## 2023-05-10 NOTE — Progress Notes (Unsigned)
CPE  Assessment / Plan:   Encounter for Annual Physical Exam with abnormal findings Due annually  Health Maintenance reviewed Healthy lifestyle reviewed and goals set  Essential hypertension, benign Continue current medications: Lisinopril 20 mg every day  Monitor blood pressure at home; call if consistently over 130/80 Continue DASH diet.   Reminder to go to the ER if any CP, SOB, nausea, dizziness, severe HA, changes vision/speech, left arm numbness and tingling and jaw pain. - EKG - CBC - CMP  Restless leg syndrome Continue requip  Continue to follow with neurology   Hyperlipidemia associated with DM -adamantly declines statins;  - check lipids, decrease fatty foods, increase activity.  - Lipid panel   Diverticulosis of large intestine without hemorrhage Doing well at this time  increase fiber   Vitamin D deficiency At goal at recent check; continue to recommend supplementation for goal of 60-100 - Vit D   Medication management Magnsium  Gastroesophageal reflux disease with esophagitis Continue PPI/H2 blocker, diet discussed  Diabetic poly neuropathy associated with type 2 diabetes mellitus Check feet regularly; control sugars DM foot exam today- sensation intact   Type 2 diabetes mellitus with stage 3 chronic kidney disease, without long-term current use of insulin (HCC) Continue medications: Glipizide 5 mg TID Unable to tolerate metformin  Discussed general issues about diabetes pathophysiology and management. Education: Reviewed 'ABCs' of diabetes management (respective goals in parentheses):  A1C (<7), blood pressure (<130/80), and cholesterol (LDL <70) Dietary recommendations Encouraged aerobic exercise.  Discussed foot care, check daily Yearly retinal exam Dental exam every 6 months Monitor blood glucose, discussed goal for patient -     COMPLETE METABOLIC PANEL WITH GFR -     Hemoglobin A1c   CKD stage 3 due to type 2 diabetes mellitus  (HCC) Increase fluids, avoid NSAIDS, monitor sugars, will monitor -     COMPLETE METABOLIC PANEL WITH GFR - CBC -     UA with reflex microscopic -  Microalbumin/creatinine urine ratio  Obesity (BMI 30-34) - long discussion about weight loss, diet, and exercise -recommended diet heavy in fruits and veggies and low in animal meats, cheeses, and dairy products     Discussed med's effects and SE's. Screening labs and tests as requested with regular follow-up as recommended.   Further disposition pending results if labs check today. Discussed med's effects and SE's.   Over 30 minutes of face to face interview, exam, counseling, chart review, and critical decision making was performed.    Future Appointments  Date Time Provider Department Center  08/06/2023  2:30 PM Lucky Cowboy, MD [facility] None  12/17/2023  3:00 PM Adela Glimpse, NP [facility] None  12/30/2023  2:00 PM Rennis Chris, MD [facility] None  05/11/2024  3:00 PM Raynelle Dick, NP [facility] None     ______________________________________________________________________  HPI 72 y.o. female  presents for a 3 month follow up and CPE. She has Diverticulosis of large intestine; Restless leg syndrome; Hyperlipidemia associated with type 2 diabetes mellitus (HCC); Vitamin D deficiency; Essential hypertension; Diabetic neuropathy (HCC); Medication management; Obesity (BMI 30.0-34.9); Type 2 diabetes mellitus with stage 3a chronic kidney disease, without long-term current use of insulin (HCC); Osteopenia; and CKD stage 3 due to type 2 diabetes mellitus (HCC) on their problem list.   She retired May 2nd 2018, states she is doing well.  Widowed, 2 grown children, 2 grandsons are in their 3s.   She had had bilateral leg swelling for years, left leg always worse than the right.  No warmth, no pain, no redness. Does compression intermittently, doesn't tolerate diuretics.   She is on requip for RLS which helps, she follows with  Dr. Allena Katz, on 3 at night and 1 mg at lunch and dinner. Followed by Dr. Allena Katz, neurology  BMI is Body mass index is 30.12 kg/m., she has been working on diet, makes good choices, limited exercise, too hot. Does do ankle exercise.  Wt Readings from Last 3 Encounters:  05/12/23 178 lb 3.2 oz (80.8 kg)  12/17/22 181 lb 3.2 oz (82.2 kg)  10/23/22 182 lb (82.6 kg)   She does not check BP at home due to always been steady, she is currently on lisinopril 20 mg qd, today their BP is BP: 126/72  BP Readings from Last 3 Encounters:  05/12/23 126/72  12/17/22 130/70  10/23/22 125/76  She does not workout. She denies chest pain,  dizziness. She will have some SOB with dragging the trash can up hill in her driveway of 400 ft, no CP with it, no dizziness, nausea or fatigue.   She is not on cholesterol medication, she absolutely declines any statins due to her husband's very poor experience. She tried Zetia in the past but does not remember any side effects  Her cholesterol is not at goal of LDL <70. The cholesterol last visit was:   Lab Results  Component Value Date   CHOL 179 12/17/2022   HDL 42 (L) 12/17/2022   LDLCALC 113 (H) 12/17/2022   TRIG 125 12/17/2022   CHOLHDL 4.3 12/17/2022   She has been working on diet and exercise for diabetes  CKD stage III on ACE Neuropathy Hyperlipidemia - declines statin Eye exam 12/2020 no retinopathy Glipizide : 1/2-1 tab TID with meals could not tolerate the metformin farxiga was too expensive but did help with swelling in her legs denies polydipsia, polyuria and visual disturbances.  She has glucometer , running 143-156 Last A1C in the office was:  Lab Results  Component Value Date   HGBA1C 8.6 (H) 12/17/2022   Lab Results  Component Value Date   EGFR 56 (L) 12/17/2022    Patient is on Vitamin D supplement, 4000 IU daily   Lab Results  Component Value Date   VD25OH 85 12/17/2022     Current Medications:   Current Outpatient Medications  (Endocrine & Metabolic):    glipiZIDE (GLUCOTROL) 5 MG tablet, TAKE 1/2 TO 1 TABLET BY MOUTH THREE TIMES DAILY WITH MEALS FOR DIABETES  Current Outpatient Medications (Cardiovascular):    lisinopril (ZESTRIL) 20 MG tablet, Take 1 tablet (20 mg total) by mouth daily. for blood pressure   Current Outpatient Medications (Analgesics):    aspirin EC 81 MG tablet, Take 81 mg by mouth daily.   Current Outpatient Medications (Other):    calcium carbonate (OS-CAL) 600 MG tablet, Take by mouth.   CRANBERRY EXTRACT PO, Take 2 tablets by mouth daily.   FIBER ADULT GUMMIES PO, Take by mouth daily.   Multiple Vitamins-Minerals (MULTIVITAMIN PO), Take by mouth daily.   OVER THE COUNTER MEDICATION, Uses OTC eye drops for dry eyes.   Phenazopyridine HCl (AZO TABS PO), Take 2 tablets by mouth daily.   rOPINIRole (REQUIP) 1 MG tablet, Take  1 tablet  3 x /day  with Meals  for Restless Legs (Patient taking differently: Take  1 tablet  2 x /day  with Meals  for Restless Legs)   rOPINIRole (REQUIP) 3 MG tablet, Take 1 tablet (3 mg total) by mouth at  bedtime.   VITAMIN D, CHOLECALCIFEROL, PO, Take 4,000 Units by mouth daily.   Health Maintenance:   Immunization History  Administered Date(s) Administered   Influenza, High Dose Seasonal PF 08/01/2017, 09/22/2018, 07/20/2019, 08/06/2022   Moderna SARS-COV2 Booster Vaccination 01/12/2021   Moderna Sars-Covid-2 Vaccination 01/18/2020, 02/15/2020, 08/18/2020   PFIZER Comirnaty(Gray Top)Covid-19 Tri-Sucrose Vaccine 07/06/2021   Pneumococcal Conjugate-13 04/05/2016   Pneumococcal Polysaccharide-23 04/15/2017   Tdap 03/07/2011   Health Maintenance  Topic Date Due   Zoster Vaccines- Shingrix (1 of 2) Never done   DTaP/Tdap/Td (2 - Td or Tdap) 03/06/2021   FOOT EXAM  05/02/2022   COVID-19 Vaccine (5 - 2023-24 season) 06/14/2022   DEXA SCAN  08/10/2022   Diabetic kidney evaluation - Urine ACR  05/03/2023   INFLUENZA VACCINE  05/15/2023   HEMOGLOBIN A1C   06/19/2023   MAMMOGRAM  08/17/2023   Diabetic kidney evaluation - eGFR measurement  12/17/2023   Medicare Annual Wellness (AWV)  12/17/2023   OPHTHALMOLOGY EXAM  12/30/2023   Colonoscopy  07/12/2026   Pneumonia Vaccine 55+ Years old  Completed   Hepatitis C Screening  Completed   HPV VACCINES  Aged Out     Eye exam: Dr. Vanessa Barbara  12/30/22, Dr. Zenaida Niece catarracts 02/06/23- not certain she wants them removed. Dentist: Dr. Tenny Craw q 6 months   Patient Care Team: Lucky Cowboy, MD as PCP - General (Internal Medicine) Lucky Cowboy, MD as PCP - Internal Medicine (Internal Medicine) Estill Bamberg, MD as Consulting Physician (Orthopedic Surgery) Glendale Chard, DO as Consulting Physician (Neurology)   Allergies:  Allergies  Allergen Reactions   Hctz [Hydrochlorothiazide]     Leg cramping   Metformin And Related Nausea Only   Phenylbutazones Swelling   Tropicamide Swelling    Red /itching/watery eyes   Medical History:  Past Medical History:  Diagnosis Date   Allergy    otc med prn   Arthritis    hips, lower back   Cataract    bilateral   Chronic kidney disease    stage 3 per patient    Diet-controlled type 2 diabetes mellitus (HCC)    Diet controlled, no meds   Diverticulosis    Dribbling urine    EKG abnormalities    pt states she had a BBB on her last EKG, no problems   Essential hypertension, benign 08/19/2013   HPV in female    Hyperlipidemia    diet controlled, no med   Restless leg syndrome    Snoring    Vitamin D deficiency    Surgical History:  She  has a past surgical history that includes Vaginal hysterectomy (1992); Tubal ligation (1981); Ovarian cyst removal (1981); Lumbar laminectomy/decompression microdiscectomy (05/07/2012); Colonoscopy (2012); and Breast biopsy (Left, 1969).  Family History:  Her family history includes Atrial fibrillation in her brother, brother, sister, sister, and sister; Cataracts in her mother; Colon cancer (age of onset: 60) in  her mother; Colon polyps in her brother and sister; Diabetes in her mother, paternal grandfather, sister, and sister; Heart failure in her mother; Heart failure (age of onset: 49) in her father; Hypertension in her mother; Kidney disease in her father; Stroke in her mother; Stroke (age of onset: 30) in her sister.   Social History:  She  reports that she has never smoked. She has been exposed to tobacco smoke. She has never used smokeless tobacco. She reports that she does not drink alcohol and does not use drugs.   Review of Systems  Constitutional:  Negative.  Negative for malaise/fatigue and weight loss.  HENT: Negative.  Negative for hearing loss and tinnitus.   Eyes: Negative.  Negative for blurred vision and double vision.  Respiratory:  Negative for cough, hemoptysis, sputum production, shortness of breath and wheezing.   Cardiovascular: Negative.  Negative for chest pain, palpitations, orthopnea, claudication, leg swelling and PND.  Gastrointestinal: Negative.  Negative for abdominal pain, blood in stool, constipation, diarrhea, heartburn, melena, nausea and vomiting.  Genitourinary:  Negative for flank pain.  Musculoskeletal:  Negative for back pain, falls, joint pain, myalgias and neck pain.  Skin:  Negative for itching and rash.  Neurological:  Positive for tingling (right foot). Negative for dizziness, tremors, sensory change, weakness and headaches.       Restless leg symptoms  Endo/Heme/Allergies:  Negative for polydipsia.  Psychiatric/Behavioral: Negative.  Negative for depression, memory loss, substance abuse and suicidal ideas. The patient is not nervous/anxious and does not have insomnia.   All other systems reviewed and are negative.   Physical Exam: Estimated body mass index is 30.12 kg/m as calculated from the following:   Height as of this encounter: 5' 4.5" (1.638 m).   Weight as of this encounter: 178 lb 3.2 oz (80.8 kg). BP 126/72   Pulse 80   Temp 97.7 F (36.5  C)   Ht 5' 4.5" (1.638 m)   Wt 178 lb 3.2 oz (80.8 kg)   SpO2 96%   BMI 30.12 kg/m    General Appearance: Well nourished, in no apparent distress. Eyes: PERRLA, EOMs, conjunctiva no swelling or erythema Sinuses: No Frontal/maxillary tenderness ENT/Mouth: Ext aud canals clear, normal light reflex with TMs without erythema, bulging.  Good dentition. No erythema, swelling, or exudate on post pharynx.  Hearing normal.  Neck: Supple, thyroid normal. No bruits Respiratory: Respiratory effort normal, BS equal bilaterally without rales, rhonchi, wheezing or stridor. Cardio: RRR without murmurs, rubs or gallops. Brisk peripheral pulses without edema.  Chest: symmetric, with normal excursions and percussion. Breasts: defer, getting annual mammograms, no concerns Pelvic:deferred no concerns Abdomen: Soft, +BS, Non tender, no guarding, rebound, hernias, masses, or organomegaly. .  Lymphatics: Non tender without lymphadenopathy.  Musculoskeletal: Full ROM all peripheral extremities,5/5 strength, and normal gait.  Skin: + varicose veins bilateral legs. Warm, dry without rashes, lesions, ecchymosis.  Neuro: Cranial nerves intact, reflexes equal bilaterally. Normal muscle tone, no cerebellar symptoms. Sensation intact to monofilament Psych: Awake and oriented X 3, normal affect, Insight and Judgment appropriate.   EKG: NSR, no ST changes  Rhianne Soman Hollie Salk, NP 3:27 PM [facility]

## 2023-05-12 ENCOUNTER — Encounter: Payer: Self-pay | Admitting: Nurse Practitioner

## 2023-05-12 ENCOUNTER — Ambulatory Visit (INDEPENDENT_AMBULATORY_CARE_PROVIDER_SITE_OTHER): Payer: Medicare Other | Admitting: Nurse Practitioner

## 2023-05-12 VITALS — BP 126/72 | HR 80 | Temp 97.7°F | Ht 64.5 in | Wt 178.2 lb

## 2023-05-12 DIAGNOSIS — E1122 Type 2 diabetes mellitus with diabetic chronic kidney disease: Secondary | ICD-10-CM

## 2023-05-12 DIAGNOSIS — E1142 Type 2 diabetes mellitus with diabetic polyneuropathy: Secondary | ICD-10-CM | POA: Diagnosis not present

## 2023-05-12 DIAGNOSIS — E1169 Type 2 diabetes mellitus with other specified complication: Secondary | ICD-10-CM | POA: Diagnosis not present

## 2023-05-12 DIAGNOSIS — Z79899 Other long term (current) drug therapy: Secondary | ICD-10-CM

## 2023-05-12 DIAGNOSIS — N183 Chronic kidney disease, stage 3 unspecified: Secondary | ICD-10-CM | POA: Diagnosis not present

## 2023-05-12 DIAGNOSIS — E785 Hyperlipidemia, unspecified: Secondary | ICD-10-CM | POA: Diagnosis not present

## 2023-05-12 DIAGNOSIS — I10 Essential (primary) hypertension: Secondary | ICD-10-CM | POA: Diagnosis not present

## 2023-05-12 DIAGNOSIS — G2581 Restless legs syndrome: Secondary | ICD-10-CM

## 2023-05-12 DIAGNOSIS — Z136 Encounter for screening for cardiovascular disorders: Secondary | ICD-10-CM

## 2023-05-12 DIAGNOSIS — N1831 Chronic kidney disease, stage 3a: Secondary | ICD-10-CM | POA: Diagnosis not present

## 2023-05-12 DIAGNOSIS — K2100 Gastro-esophageal reflux disease with esophagitis, without bleeding: Secondary | ICD-10-CM

## 2023-05-12 DIAGNOSIS — E559 Vitamin D deficiency, unspecified: Secondary | ICD-10-CM | POA: Diagnosis not present

## 2023-05-12 DIAGNOSIS — E669 Obesity, unspecified: Secondary | ICD-10-CM

## 2023-05-12 DIAGNOSIS — K5730 Diverticulosis of large intestine without perforation or abscess without bleeding: Secondary | ICD-10-CM | POA: Diagnosis not present

## 2023-05-12 DIAGNOSIS — Z0001 Encounter for general adult medical examination with abnormal findings: Secondary | ICD-10-CM

## 2023-05-12 LAB — CBC WITH DIFFERENTIAL/PLATELET
Absolute Monocytes: 317 cells/uL (ref 200–950)
Basophils Absolute: 31 cells/uL (ref 0–200)
Basophils Relative: 0.5 %
Eosinophils Absolute: 220 cells/uL (ref 15–500)
Eosinophils Relative: 3.6 %
HCT: 42.6 % (ref 35.0–45.0)
Hemoglobin: 14.1 g/dL (ref 11.7–15.5)
Lymphs Abs: 2056 cells/uL (ref 850–3900)
MCH: 29.1 pg (ref 27.0–33.0)
MCHC: 33.1 g/dL (ref 32.0–36.0)
MCV: 87.8 fL (ref 80.0–100.0)
MPV: 9.8 fL (ref 7.5–12.5)
Monocytes Relative: 5.2 %
Neutro Abs: 3477 cells/uL (ref 1500–7800)
Neutrophils Relative %: 57 %
Platelets: 177 10*3/uL (ref 140–400)
RBC: 4.85 10*6/uL (ref 3.80–5.10)
RDW: 12.3 % (ref 11.0–15.0)
Total Lymphocyte: 33.7 %
WBC: 6.1 10*3/uL (ref 3.8–10.8)

## 2023-05-12 MED ORDER — EZETIMIBE 10 MG PO TABS
10.0000 mg | ORAL_TABLET | Freq: Every day | ORAL | 11 refills | Status: DC
Start: 2023-05-12 — End: 2024-05-13

## 2023-05-12 NOTE — Patient Instructions (Signed)

## 2023-07-22 ENCOUNTER — Other Ambulatory Visit: Payer: Self-pay | Admitting: Internal Medicine

## 2023-07-22 DIAGNOSIS — Z1231 Encounter for screening mammogram for malignant neoplasm of breast: Secondary | ICD-10-CM

## 2023-08-06 ENCOUNTER — Ambulatory Visit: Payer: Medicare Other | Admitting: Internal Medicine

## 2023-08-25 DIAGNOSIS — H40033 Anatomical narrow angle, bilateral: Secondary | ICD-10-CM | POA: Diagnosis not present

## 2023-08-25 DIAGNOSIS — H52213 Irregular astigmatism, bilateral: Secondary | ICD-10-CM | POA: Diagnosis not present

## 2023-08-25 DIAGNOSIS — H25813 Combined forms of age-related cataract, bilateral: Secondary | ICD-10-CM | POA: Diagnosis not present

## 2023-08-25 DIAGNOSIS — H25811 Combined forms of age-related cataract, right eye: Secondary | ICD-10-CM | POA: Diagnosis not present

## 2023-08-25 DIAGNOSIS — H524 Presbyopia: Secondary | ICD-10-CM | POA: Diagnosis not present

## 2023-08-28 ENCOUNTER — Encounter: Payer: Self-pay | Admitting: Internal Medicine

## 2023-08-28 ENCOUNTER — Ambulatory Visit: Payer: Medicare Other | Admitting: Internal Medicine

## 2023-08-28 VITALS — BP 134/70 | HR 85 | Temp 97.9°F | Resp 16 | Ht 64.5 in | Wt 179.2 lb

## 2023-08-28 DIAGNOSIS — N1831 Chronic kidney disease, stage 3a: Secondary | ICD-10-CM | POA: Diagnosis not present

## 2023-08-28 DIAGNOSIS — E785 Hyperlipidemia, unspecified: Secondary | ICD-10-CM

## 2023-08-28 DIAGNOSIS — I10 Essential (primary) hypertension: Secondary | ICD-10-CM

## 2023-08-28 DIAGNOSIS — E1122 Type 2 diabetes mellitus with diabetic chronic kidney disease: Secondary | ICD-10-CM | POA: Diagnosis not present

## 2023-08-28 DIAGNOSIS — E559 Vitamin D deficiency, unspecified: Secondary | ICD-10-CM | POA: Diagnosis not present

## 2023-08-28 DIAGNOSIS — Z79899 Other long term (current) drug therapy: Secondary | ICD-10-CM | POA: Diagnosis not present

## 2023-08-28 DIAGNOSIS — Z23 Encounter for immunization: Secondary | ICD-10-CM | POA: Diagnosis not present

## 2023-08-28 DIAGNOSIS — E1169 Type 2 diabetes mellitus with other specified complication: Secondary | ICD-10-CM

## 2023-08-28 NOTE — Progress Notes (Signed)
Future Appointments  Date Time Provider Department  08/28/2023 10:30 AM Lucky Cowboy, MD [facility]  12/17/2023               wellness  3:00 PM Adela Glimpse, NP [facility]  12/30/2023  2:00 PM Rennis Chris, MD [facility]  05/11/2024              cpe  3:00 PM Raynelle Dick, NP [facility]    History of Present Illness:       This very nice 72 y.o. WWF presents for 3 month follow up with HTN, HLD, Pre-Diabetes and Vitamin D Deficiency.         Patient is treated for HTN  ( 2018 ) & BP has been controlled at home. Today's BP is at goal - 134/70. Patient has had no complaints of any cardiac type chest pain, palpitations, dyspnea Pollyann Kennedy /PND, dizziness, claudication  or dependent edema.        Hyperlipidemia is controlled with diet & Ezetimibe.  Patient denies myalgias or other med SE's. Last Lipids were at goal :  Lab Results  Component Value Date   CHOL 166 05/12/2023   HDL 39 (L) 05/12/2023   LDLCALC 104 (H) 05/12/2023   TRIG 124 05/12/2023   CHOLHDL 4.3 05/12/2023     Also, the patient has history of T2_NIDDM  on Glipizide since 2001 w/CKD3a  (GFR 51) and has had no symptoms of reactive hypoglycemia, diabetic polys, paresthesias or visual blurring.  Last A1c was not at goal :  Lab Results  Component Value Date   HGBA1C 8.3 (H) 05/12/2023                                                       Further, the patient also has history of Vitamin D Deficiency and supplements vitamin D without any suspected side-effects. Last vitamin D was at goal :  Lab Results  Component Value Date   VD25OH 90 05/12/2023       Current Outpatient Medications on File Prior to Visit  Medication Sig   acetaminophen  325 MG tablet Take 650 mg every 6 hours as needed.   aspirin EC 81 MG tablet Take  daily.   CRANBERRY EXTRACT PO Take 2 tablets daily.   ezetimibe 10 MG tablet Take 1 tablet daily.   FIBER ADULT GUMMIES  Take daily.   glipiZIDE 5 MG tablet Take  1/2 to 1 tablet  3  x /day   with Meals    lisinopril 10 MG tablet TAKE 1 TABLET DAILY    Multiple Vitamins-Minerals  Take  daily.   OTC eye drops  Uses for dry eyes.   Phenazopyridine (AZO ) Take 2 tablets  daily.   rOPINIRole  0.5 MG tablet Take 1 tablet at noon and 4pm   VITAMIN D Take 4,000 Units daily.     Allergies  Allergen Reactions   Hctz [Hydrochlorothiazide]     Leg cramping   Metformin And Related Nausea Only   Phenylbutazones Swelling   Tropicamide Swelling    Red /itching/watery eyes    PMHx:   Past Medical History:  Diagnosis Date   Allergy    otc med prn   Arthritis    hips, lower back   Cataract    bilateral   Chronic kidney  disease    stage 3 per patient    Diet-controlled type 2 diabetes mellitus (HCC)    Diet controlled, no meds   Diverticulosis    Dribbling urine    EKG abnormalities    pt states she had a BBB on her last EKG, no problems   Essential hypertension, benign 08/19/2013   HPV in female    Hyperlipidemia    diet controlled, no med   Restless leg syndrome    Snoring    Vitamin D deficiency      Immunization History  Administered Date(s) Administered   Influenza, High Dose  08/01/2017, 09/22/2018, 07/20/2019   Moderna SARS-COV2 Booster 01/12/2021   Moderna Sars-Covid-2 Vacc 01/18/2020, 02/15/2020, 08/18/2020   Pneumococcal -13 04/05/2016   Pneumococcal -23 04/15/2017   Tdap 03/07/2011     Past Surgical History:  Procedure Laterality Date   BREAST BIOPSY Left 1969   left lumpectomy - benign   COLONOSCOPY  2012   kaplan-polyps, tics   LUMBAR LAMINECTOMY/DECOMPRESSION MICRODISCECTOMY  05/07/2012   Procedure: LUMBAR LAMINECTOMY/DECOMPRESSION MICRODISCECTOMY;  Surgeon: Emilee Hero, MD;  Location: [facility] OR;  Service: Orthopedics;  Laterality: Bilateral;  Lumbar 4-5 decompression   OVARIAN CYST REMOVAL  1981   right   TUBAL LIGATION  1981   VAGINAL HYSTERECTOMY  1992    FHx:    Reviewed / unchanged  SHx:    Reviewed / unchanged    Systems Review:  Constitutional: Denies fever, chills, wt changes, headaches, insomnia, fatigue, night sweats, change in appetite. Eyes: Denies redness, blurred vision, diplopia, discharge, itchy, watery eyes.  ENT: Denies discharge, congestion, post nasal drip, epistaxis, sore throat, earache, hearing loss, dental pain, tinnitus, vertigo, sinus pain, snoring.  CV: Denies chest pain, palpitations, irregular heartbeat, syncope, dyspnea, diaphoresis, orthopnea, PND, claudication or edema. Respiratory: denies cough, dyspnea, DOE, pleurisy, hoarseness, laryngitis, wheezing.  Gastrointestinal: Denies dysphagia, odynophagia, heartburn, reflux, water brash, abdominal pain or cramps, nausea, vomiting, bloating, diarrhea, constipation, hematemesis, melena, hematochezia  or hemorrhoids. Genitourinary: Denies dysuria, frequency, urgency, nocturia, hesitancy, discharge, hematuria or flank pain. Musculoskeletal: Denies arthralgias, myalgias, stiffness, jt. swelling, pain, limping or strain/sprain.  Skin: Denies pruritus, rash, hives, warts, acne, eczema or change in skin lesion(s). Neuro: No weakness, tremor, incoordination, spasms, paresthesia or pain. Psychiatric: Denies confusion, memory loss or sensory loss. Endo: Denies change in weight, skin or hair change.  Heme/Lymph: No excessive bleeding, bruising or enlarged lymph nodes.  Physical Exam  BP 134/70   Pulse 85   Temp 97.9 F (36.6 C)   Resp 16   Ht 5' 4.5" (1.638 m)   Wt 179 lb 3.2 oz (81.3 kg)   SpO2 98%   BMI 30.28 kg/m   Appears  over nourished, well groomed  and in no distress.  Eyes: PERRLA, EOMs, conjunctiva no swelling or erythema. Sinuses: No frontal/maxillary tenderness ENT/Mouth: EAC's clear, TM's nl w/o erythema, bulging. Nares clear w/o erythema, swelling, exudates. Oropharynx clear without erythema or exudates. Oral hygiene is good. Tongue normal, non obstructing. Hearing intact.  Neck: Supple. Thyroid not palpable. Car  2+/2+ without bruits, nodes or JVD. Chest: Respirations nl with BS clear & equal w/o rales, rhonchi, wheezing or stridor.  Cor: Heart sounds normal w/ regular rate and rhythm without sig. murmurs, gallops, clicks or rubs. Peripheral pulses normal and equal  without edema.  Abdomen: Soft & bowel sounds normal. Non-tender w/o guarding, rebound, hernias, masses or organomegaly.  Lymphatics: Unremarkable.  Musculoskeletal: Full ROM all peripheral extremities, joint stability, 5/5 strength and  normal gait.  Skin: Warm, dry without exposed rashes, lesions or ecchymosis apparent.  Neuro: Cranial nerves intact, reflexes equal bilaterally. Sensory-motor testing grossly intact. Tendon reflexes grossly intact.  Pysch: Alert & oriented x 3.  Insight and judgement nl & appropriate. No ideations.  Assessment and Plan:   1. Essential hypertension  - Continue medication, monitor blood pressure at home.  - Continue DASH diet.  Reminder to go to the ER if any CP,  SOB, nausea, dizziness, severe HA, changes vision/speech.   - CBC with Differential/Platelet - COMPLETE METABOLIC PANEL WITH GFR - Magnesium   2. Hyperlipidemia associated with type 2 diabetes mellitus (HCC)  - Continue diet/meds, exercise,& lifestyle modifications.  - Continue monitor periodic cholesterol/liver & renal functions     - Lipid pane - TSH   3. Type 2 diabetes mellitus with stage 3a chronic kidney                                   disease, without long-term current use of insulin (HCC)  -  Hgb A1c  - Insulin, random - PTH, intact and calcium    - Hemoglobin A1c - Insulin, random - Parathyroid hormone, intact (no Ca)  4. Vitamin D deficiency  - Continue supplementation   - VITAMIN D 25 Hydroxyl   5. Medication management  - CBC with Differential/Platelet - COMPLETE METABOLIC PANEL WITH GFR - Magnesium - Lipid panel - TSH - Hemoglobin A1c - Insulin, random - VITAMIN D 25 Hydroxy  - Parathyroid hormone,  intact (no Ca)         Discussed  regular exercise, BP monitoring, weight control to achieve/maintain BMI less than 25 and discussed med and SE's. Recommended labs to assess and monitor clinical status with further disposition pending results of labs.  I discussed the assessment and treatment plan with the patient. The patient was provided an opportunity to ask questions and all were answered. The patient agreed with the plan and demonstrated an understanding of the instructions.  I provided over 30 minutes of exam, counseling, chart review and  complex critical decision making.        The patient was advised to call back or seek an in-person evaluation if the symptoms worsen or if the condition fails to improve as anticipated.   Marinus Maw, MD.

## 2023-08-28 NOTE — Patient Instructions (Addendum)
Due to recent changes in healthcare laws, you may see the results of your imaging and laboratory studies on MyChart before your provider has had a chance to review them.  We understand that in some cases there may be results that are confusing or concerning to you. Not all laboratory results come back in the same time frame and the provider may be waiting for multiple results in order to interpret others.  Please give [name] 48 hours in order for your provider to thoroughly review all the results before contacting the office for clarification of your results.  ++++++++++++++++++++++++++  Vit D  & Vit C 1,000 mg   are recommended to help protect  against the Covid-19 and other Corona viruses.    Also it's recommended  to take  Zinc 50 mg x 1/2 tab = 25 mg  / day  to help  protect against the Covid-19   and best place to get  is also on [employer].[email or web address]  and don't pay more than 6-8 cents /pill !   +++++++++++++++++++++++++++++++++++++++ Recommend Adult Low Dose Aspirin or  coated  Aspirin 81 mg daily  To reduce risk of Colon Cancer 40 %,  Skin Cancer 26 % ,  Melanoma 46%  and  Pancreatic cancer 60% +++++++++++++++++++++++++++++++++++++++++ Vitamin D goal  is between 70-100.  Please make sure that you are taking your Vitamin D as directed.  It is very important as a natural anti-inflammatory  helping hair, skin, and nails, as well as reducing stroke and heart attack risk.  It helps your bones and helps with mood. It also decreases numerous cancer risks so please take it as directed.  Low Vit D is associated with a 200-300% higher risk for CANCER  and 200-300% higher risk for HEART   ATTACK  &  STROKE.   .....................................Marland Kitchen It is also associated with higher death rate at younger ages,  autoimmune diseases like Rheumatoid arthritis, Lupus, Multiple Sclerosis.    Also many other serious conditions, like depression, Alzheimer's Dementia, infertility, muscle aches, fatigue,  fibromyalgia - just to name a few. +++++++++++++++++++++++++++++++++++++++++ Recommend the book "The END of DIETING" by Dr Monico Hoar  & the book "The END of DIABETES " by Dr Monico Hoar At [email or web address] - get book & Audio CD's    Being diabetic has a  300% increased risk for heart attack, stroke, cancer, and alzheimer- type vascular dementia. It is very important that you work harder with diet by avoiding all foods that are white. Avoid white rice (brown & wild rice is OK), white potatoes (sweetpotatoes in moderation is OK), White bread or wheat bread or anything made out of white flour like bagels, donuts, rolls, buns, biscuits, cakes, pastries, cookies, pizza crust, and pasta (made from white flour & egg whites) - vegetarian pasta or spinach or wheat pasta is OK. Multigrain breads like Arnold's or Pepperidge Farm, or multigrain sandwich thins or flatbreads.  Diet, exercise and weight loss can reverse and cure diabetes in the early stages.  Diet, exercise and weight loss is very important in the control and prevention of complications of diabetes which affects every system in your body, ie. Brain - dementia/stroke, eyes - glaucoma/blindness, heart - heart attack/heart failure, kidneys - dialysis, stomach - gastric paralysis, intestines - malabsorption, nerves - severe painful neuritis, circulation - gangrene & loss of a leg(s), and finally cancer and Alzheimers.    I recommend avoid fried & greasy foods,  sweets/candy, white rice (brown or wild rice or Quinoa is  OK), white potatoes (sweet potatoes are OK) - anything made from white flour - bagels, doughnuts, rolls, buns, biscuits,white and wheat breads, pizza crust and traditional pasta made of white flour & egg white(vegetarian pasta or spinach or wheat pasta is OK).  Multi-grain bread is OK - like multi-grain flat bread or sandwich thins. Avoid alcohol in excess. Exercise is also important.    Eat all the vegetables you want - avoid meat, especially red  meat and dairy - especially cheese.  Cheese is the most concentrated form of trans-fats which is the worst thing to clog up our arteries. Veggie cheese is OK which can be found in the fresh produce section at [employer] or [employer] or [employer]  +++++++++++++++++++++++++++++++++++++++ DASH Eating Plan  DASH stands for "Dietary Approaches to Stop Hypertension."   The DASH eating plan is a healthy eating plan that has been shown to reduce high blood pressure (hypertension). Additional health benefits may include reducing the risk of type 2 diabetes mellitus, heart disease, and stroke. The DASH eating plan may also help with weight loss. WHAT DO I NEED TO KNOW ABOUT THE DASH EATING PLAN? For the DASH eating plan, you will follow these general guidelines: Choose foods with a percent daily value for sodium of less than 5% (as listed on the food label). Use salt-free seasonings or herbs instead of table salt or sea salt. Check with your health care provider or pharmacist before using salt substitutes. Eat lower-sodium products, often labeled as "lower sodium" or "no salt added." Eat fresh foods. Eat more vegetables, fruits, and low-fat dairy products. Choose whole grains. Look for the word "whole" as the first word in the ingredient list. Choose fish  Limit sweets, desserts, sugars, and sugary drinks. Choose heart-healthy fats. Eat veggie cheese  Eat more home-cooked food and less restaurant, buffet, and fast food. Limit fried foods. Cook foods using methods other than frying. Limit canned vegetables. If you do use them, rinse them well to decrease the sodium. When eating at a restaurant, ask that your food be prepared with less salt, or no salt if possible.                      WHAT FOODS CAN I EAT? Read Dr Francis Dowse Fuhrman's books on The End of Dieting & The End of Diabetes  Grains Whole grain or whole wheat bread. Brown rice. Whole grain or whole wheat pasta. Quinoa, bulgur, and whole  grain cereals. Low-sodium cereals. Corn or whole wheat flour tortillas. Whole grain cornbread. Whole grain crackers. Low-sodium crackers.  Vegetables Fresh or frozen vegetables (raw, steamed, roasted, or grilled). Low-sodium or reduced-sodium tomato and vegetable juices. Low-sodium or reduced-sodium tomato sauce and paste. Low-sodium or reduced-sodium canned vegetables.   Fruits All fresh, canned (in natural juice), or frozen fruits.  Protein Products  All fish and seafood.  Dried beans, peas, or lentils. Unsalted nuts and seeds. Unsalted canned beans.  Dairy Low-fat dairy products, such as skim or 1% milk, 2% or reduced-fat cheeses, low-fat ricotta or cottage cheese, or plain low-fat yogurt. Low-sodium or reduced-sodium cheeses.  Fats and Oils Tub margarines without trans fats. Light or reduced-fat mayonnaise and salad dressings (reduced sodium). Avocado. Safflower, olive, or canola oils. Natural peanut or almond butter.  Other Unsalted popcorn and pretzels. The items listed above may not be a complete list of recommended foods or beverages. Contact your dietitian for more options.  +++++++++++++++  WHAT FOODS ARE NOT RECOMMENDED? Grains/ White flour or  wheat flour White bread. White pasta. White rice. Refined cornbread. Bagels and croissants. Crackers that contain trans fat.  Vegetables  Creamed or fried vegetables. Vegetables in a . Regular canned vegetables. Regular canned tomato sauce and paste. Regular tomato and vegetable juices.  Fruits Dried fruits. Canned fruit in light or heavy syrup. Fruit juice.  Meat and Other Protein Products Meat in general - RED meat & White meat.  Fatty cuts of meat. Ribs, chicken wings, all processed meats as bacon, sausage, bologna, salami, fatback, hot dogs, bratwurst and packaged luncheon meats.  Dairy Whole or 2% milk, cream, half-and-half, and cream cheese. Whole-fat or sweetened yogurt. Full-fat cheeses or blue cheese. Non-dairy creamers  and whipped toppings. Processed cheese, cheese spreads, or cheese curds.  Condiments Onion and garlic salt, seasoned salt, table salt, and sea salt. Canned and packaged gravies. Worcestershire sauce. Tartar sauce. Barbecue sauce. Teriyaki sauce. Soy sauce, including reduced sodium. Steak sauce. Fish sauce. Oyster sauce. Cocktail sauce. Horseradish. Ketchup and mustard. Meat flavorings and tenderizers. Bouillon cubes. Hot sauce. Tabasco sauce. Marinades. Taco seasonings. Relishes.  Fats and Oils Butter, stick margarine, lard, shortening and bacon fat. Coconut, palm kernel, or palm oils. Regular salad dressings.  Pickles and olives. Salted popcorn and pretzels.  The items listed above may not be a complete list of foods and beverages to avoid.

## 2023-08-29 LAB — LIPID PANEL
Cholesterol: 124 mg/dL (ref <200)
HDL: 40 mg/dL — ABNORMAL LOW (ref >=50)
LDL Cholesterol (Calc): 59 mg/dL
Non-HDL Cholesterol (Calc): 84 mg/dL (ref <130)
Total CHOL/HDL Ratio: 3.1 (calc) (ref <5.0)
Triglycerides: 168 mg/dL — ABNORMAL HIGH (ref <150)

## 2023-08-29 LAB — MAGNESIUM: Magnesium: 2.2 mg/dL (ref 1.5–2.5)

## 2023-08-29 LAB — COMPLETE METABOLIC PANEL WITH GFR
AG Ratio: 1.4 (calc) (ref 1.0–2.5)
ALT: 20 U/L (ref 6–29)
AST: 17 U/L (ref 10–35)
Albumin: 4.3 g/dL (ref 3.6–5.1)
Alkaline phosphatase (APISO): 78 U/L (ref 37–153)
BUN/Creatinine Ratio: 15 (calc) (ref 6–22)
BUN: 16 mg/dL (ref 7–25)
CO2: 28 mmol/L (ref 20–32)
Calcium: 9.7 mg/dL (ref 8.6–10.4)
Chloride: 104 mmol/L (ref 98–110)
Creat: 1.06 mg/dL — ABNORMAL HIGH (ref 0.60–1.00)
Globulin: 3 g/dL (ref 1.9–3.7)
Glucose, Bld: 122 mg/dL — ABNORMAL HIGH (ref 65–99)
Potassium: 4.3 mmol/L (ref 3.5–5.3)
Sodium: 138 mmol/L (ref 135–146)
Total Bilirubin: 0.5 mg/dL (ref 0.2–1.2)
Total Protein: 7.3 g/dL (ref 6.1–8.1)
eGFR: 56 mL/min/{1.73_m2} — ABNORMAL LOW (ref >=60)

## 2023-08-29 LAB — PARATHYROID HORMONE, INTACT (NO CA): PTH: 25 pg/mL (ref 16–77)

## 2023-08-29 LAB — CBC WITH DIFFERENTIAL/PLATELET
Absolute Lymphocytes: 1801 {cells}/uL (ref 850–3900)
Absolute Monocytes: 365 {cells}/uL (ref 200–950)
Basophils Absolute: 29 {cells}/uL (ref 0–200)
Basophils Relative: 0.5 %
Eosinophils Absolute: 154 {cells}/uL (ref 15–500)
Eosinophils Relative: 2.7 %
HCT: 42.5 % (ref 35.0–45.0)
Hemoglobin: 14.2 g/dL (ref 11.7–15.5)
MCH: 30 pg (ref 27.0–33.0)
MCHC: 33.4 g/dL (ref 32.0–36.0)
MCV: 89.7 fL (ref 80.0–100.0)
MPV: 9.8 fL (ref 7.5–12.5)
Monocytes Relative: 6.4 %
Neutro Abs: 3352 {cells}/uL (ref 1500–7800)
Neutrophils Relative %: 58.8 %
Platelets: 172 10*3/uL (ref 140–400)
RBC: 4.74 10*6/uL (ref 3.80–5.10)
RDW: 12.3 % (ref 11.0–15.0)
Total Lymphocyte: 31.6 %
WBC: 5.7 10*3/uL (ref 3.8–10.8)

## 2023-08-29 LAB — VITAMIN D 25 HYDROXY (VIT D DEFICIENCY, FRACTURES): Vit D, 25-Hydroxy: 86 ng/mL (ref 30–100)

## 2023-08-29 LAB — INSULIN, RANDOM: Insulin: 30.3 u[IU]/mL — ABNORMAL HIGH

## 2023-08-29 LAB — HEMOGLOBIN A1C
Hgb A1c MFr Bld: 8.2 %{Hb} — ABNORMAL HIGH (ref <5.7)
Mean Plasma Glucose: 189 mg/dL
eAG (mmol/L): 10.4 mmol/L

## 2023-08-29 LAB — TSH: TSH: 1.4 m[IU]/L (ref 0.40–4.50)

## 2023-08-29 NOTE — Progress Notes (Signed)
<>*<>*<>*<>*<>*<>*<>*<>*<>*<>*<>*<>*<>*<>*<>*<>*<>*<>*<>*<>*<>*<>*<>*<>*<> <>*<>*<>*<>*<>*<>*<>*<>*<>*<>*<>*<>*<>*<>*<>*<>*<>*<>*<>*<>*<>*<>*<>*<>*<>  -  Test results slightly outside the reference range are not unusual. If there is anything important, I will review this with you,  otherwise it is considered normal test values.  If you have further questions,  please do not hesitate to contact me at the office or via My Chart.   <>*<>*<>*<>*<>*<>*<>*<>*<>*<>*<>*<>*<>*<>*<>*<>*<>*<>*<>*<>*<>*<>*<>*<>*<> <>*<>*<>*<>*<>*<>*<>*<>*<>*<>*<>*<>*<>*<>*<>*<>*<>*<>*<>*<>*<>*<>*<>*<>*<>  -  Kidney functions Stable   <>*<>*<>*<>*<>*<>*<>*<>*<>*<>*<>*<>*<>*<>*<>*<>*<>*<>*<>*<>*<>*<>*<>*<>*<> <>*<>*<>*<>*<>*<>*<>*<>*<>*<>*<>*<>*<>*<>*<>*<>*<>*<>*<>*<>*<>*<>*<>*<>*<>  -  Chol = 124  & LDL = 59     -      Both   Excellent   - Very low risk for Heart Attack  / Stroke  <>*<>*<>*<>*<>*<>*<>*<>*<>*<>*<>*<>*<>*<>*<>*<>*<>*<>*<>*<>*<>*<>*<>*<>*<> <>*<>*<>*<>*<>*<>*<>*<>*<>*<>*<>*<>*<>*<>*<>*<>*<>*<>*<>*<>*<>*<>*<>*<>*<>  -  A1c = 12 week average Blood Sugar = 8.2% is too high                                                             ( ( Goal is less than 5.7%  !  ) )   So . . . . . . . . . Marland Kitchen   - Avoid Sweets, Candy & White Stuff   - White Rice, [place], [place] Flour  - Breads &  Pasta  <>*<>*<>*<>*<>*<>*<>*<>*<>*<>*<>*<>*<>*<>*<>*<>*<>*<>*<>*<>*<>*<>*<>*<>*<> <>*<>*<>*<>*<>*<>*<>*<>*<>*<>*<>*<>*<>*<>*<>*<>*<>*<>*<>*<>*<>*<>*<>*<>*<>  -  Vitamin D = 86  - Excellent - Please keep dosage  same   <>*<>*<>*<>*<>*<>*<>*<>*<>*<>*<>*<>*<>*<>*<>*<>*<>*<>*<>*<>*<>*<>*<>*<>*<> <>*<>*<>*<>*<>*<>*<>*<>*<>*<>*<>*<>*<>*<>*<>*<>*<>*<>*<>*<>*<>*<>*<>*<>*<>  -  PTH Is hormone that regulates Calcium balance & is Normal   <>*<>*<>*<>*<>*<>*<>*<>*<>*<>*<>*<>*<>*<>*<>*<>*<>*<>*<>*<>*<>*<>*<>*<>*<> <>*<>*<>*<>*<>*<>*<>*<>*<>*<>*<>*<>*<>*<>*<>*<>*<>*<>*<>*<>*<>*<>*<>*<>*<>  -  All Else - CBC -  Electrolytes -  Liver - Magnesium & Thyroid    - all  Normal / OK  <>*<>*<>*<>*<>*<>*<>*<>*<>*<>*<>*<>*<>*<>*<>*<>*<>*<>*<>*<>*<>*<>*<>*<>*<> <>*<>*<>*<>*<>*<>*<>*<>*<>*<>*<>*<>*<>*<>*<>*<>*<>*<>*<>*<>*<>*<>*<>*<>*<>

## 2023-09-08 ENCOUNTER — Ambulatory Visit
Admission: RE | Admit: 2023-09-08 | Discharge: 2023-09-08 | Disposition: A | Discharge status: Home / Self Care | Payer: Medicare Other | Source: Ambulatory Visit | Attending: Internal Medicine | Admitting: Internal Medicine

## 2023-09-08 DIAGNOSIS — Z1231 Encounter for screening mammogram for malignant neoplasm of breast: Secondary | ICD-10-CM

## 2023-09-19 ENCOUNTER — Other Ambulatory Visit: Payer: Self-pay | Admitting: Internal Medicine

## 2023-09-19 DIAGNOSIS — G2581 Restless legs syndrome: Secondary | ICD-10-CM

## 2023-09-27 ENCOUNTER — Other Ambulatory Visit: Payer: Self-pay | Admitting: Nurse Practitioner

## 2023-09-27 DIAGNOSIS — E1165 Type 2 diabetes mellitus with hyperglycemia: Secondary | ICD-10-CM

## 2023-09-30 DIAGNOSIS — H2512 Age-related nuclear cataract, left eye: Secondary | ICD-10-CM | POA: Diagnosis not present

## 2023-09-30 DIAGNOSIS — H25812 Combined forms of age-related cataract, left eye: Secondary | ICD-10-CM | POA: Diagnosis not present

## 2023-09-30 DIAGNOSIS — H25813 Combined forms of age-related cataract, bilateral: Secondary | ICD-10-CM | POA: Diagnosis not present

## 2023-12-16 NOTE — Progress Notes (Signed)
 [facility] - Clinic Note  12/30/2023     CHIEF COMPLAINT Patient presents for Retina Follow Up   HISTORY OF PRESENT ILLNESS: Rachel Daniel is a 73 y.o. female who presents to the clinic today for:   HPI     Retina Follow Up   Patient presents with  Diabetic Retinopathy.  In both eyes.  This started 1 year ago.  I, the attending physician,  performed the HPI with the patient and updated documentation appropriately.        Comments   Patient here for 1 year retina follow up for DM exam. Patient states vision doing ok. It is good. Had seen Dr Zenaida Niece and is allergic to dilating drops. Doctor said to use Pataday before putting drops in and after drops. .no eye pain.       Last edited by Rennis Chris, MD on 12/30/2023  6:43 PM.    Patient states vision is doing OK. Last A1c was 8.2 on 11.14.24.   Referring physician: Lucky Cowboy, MD [address] [place],  [place] [address]  HISTORICAL INFORMATION:   Selected notes from the MEDICAL RECORD NUMBER Referred by A. Corbett, NP (internal med) for DM exam;   LEE- unknown - saw Dr. Ashley Royalty on 08.07.17, BCVA at that time OD: 20/20 OS: 20/20 Ocular Hx- cataract OU PMH- DM, arthritis, CKD, HTN    CURRENT MEDICATIONS: No current outpatient medications on file. (Ophthalmic Drugs)   No current facility-administered medications for this visit. (Ophthalmic Drugs)   Current Outpatient Medications (Other)  Medication Sig   aspirin EC 81 MG tablet Take 81 mg by mouth daily.   calcium carbonate (OS-CAL) 600 MG tablet Take by mouth.   CRANBERRY EXTRACT PO Take 2 tablets by mouth daily.   ezetimibe (ZETIA) 10 MG tablet Take 1 tablet (10 mg total) by mouth daily.   FIBER ADULT GUMMIES PO Take by mouth daily.   glipiZIDE (GLUCOTROL) 5 MG tablet TAKE 1/2 TO 1 TABLET BY MOUTH THREE TIMES DAILY WITH MEALS FOR DIABETES   lisinopril (ZESTRIL) 20 MG tablet Take 1 tablet (20 mg total) by mouth daily. for  blood pressure   Multiple Vitamins-Minerals (MULTIVITAMIN PO) Take by mouth daily.   OVER THE COUNTER MEDICATION Uses OTC eye drops for dry eyes.   Phenazopyridine HCl (AZO TABS PO) Take 2 tablets by mouth daily.   rOPINIRole (REQUIP) 1 MG tablet TAKE 1 TABLET BY MOUTH THREE TIMES DAILY WITH MEALS FOR RESTLESS LEGS   rOPINIRole (REQUIP) 3 MG tablet Take 1 tablet (3 mg total) by mouth at bedtime.   VITAMIN D, CHOLECALCIFEROL, PO Take 4,000 Units by mouth daily.   No current facility-administered medications for this visit. (Other)   REVIEW OF SYSTEMS: ROS   Positive for: Endocrine, Cardiovascular, Eyes Negative for: Constitutional, Gastrointestinal, Neurological, Skin, Genitourinary, Musculoskeletal, HENT, Respiratory, Psychiatric, Allergic/Imm, Heme/Lymph Last edited by Laddie Aquas, COA on 12/30/2023  2:06 PM.     ALLERGIES Allergies  Allergen Reactions   Hctz [Hydrochlorothiazide]     Leg cramping   Metformin And Related Nausea Only   Phenylbutazones Swelling   Tropicamide Swelling    Red /itching/watery eyes   PAST MEDICAL HISTORY Past Medical History:  Diagnosis Date   Allergy    otc med prn   Arthritis    hips, lower back   Cataract    bilateral   Chronic kidney disease    stage 3 per patient    Diet-controlled type  2 diabetes mellitus (HCC)    Diet controlled, no meds   Diverticulosis    Dribbling urine    EKG abnormalities    pt states she had a BBB on her last EKG, no problems   Essential hypertension, benign 08/19/2013   HPV in female    Hyperlipidemia    diet controlled, no med   Restless leg syndrome    Snoring    Vitamin D deficiency    Past Surgical History:  Procedure Laterality Date   BREAST BIOPSY Left 1969   benign   BREAST EXCISIONAL BIOPSY Left 1969   benign   COLONOSCOPY  2012   kaplan-polyps, tics   LUMBAR LAMINECTOMY/DECOMPRESSION MICRODISCECTOMY  05/07/2012   Procedure: LUMBAR LAMINECTOMY/DECOMPRESSION MICRODISCECTOMY;  Surgeon:  Emilee Hero, MD;  Location: [facility] OR;  Service: Orthopedics;  Laterality: Bilateral;  Lumbar 4-5 decompression   OVARIAN CYST REMOVAL  1981   right   TUBAL LIGATION  1981   VAGINAL HYSTERECTOMY  1992   FAMILY HISTORY Family History  Problem Relation Age of Onset   Hypertension Mother    Diabetes Mother    Stroke Mother    Heart failure Mother    Colon cancer Mother 60   Cataracts Mother    Heart failure Father 6   Kidney disease Father    Stroke Sister 17   Colon polyps Sister    Atrial fibrillation Sister    Diabetes Sister    Atrial fibrillation Sister    Diabetes Sister    Atrial fibrillation Sister    Breast cancer Sister        >50   Diabetes Paternal Grandfather    Colon polyps Brother    Atrial fibrillation Brother    Atrial fibrillation Brother    Stomach cancer Neg Hx    Esophageal cancer Neg Hx    Rectal cancer Neg Hx    Amblyopia Neg Hx    Blindness Neg Hx    Glaucoma Neg Hx    Macular degeneration Neg Hx    Retinal detachment Neg Hx    Strabismus Neg Hx    Retinitis pigmentosa Neg Hx    BRCA 1/2 Neg Hx    SOCIAL HISTORY Social History   Tobacco Use   Smoking status: Never    Passive exposure: Past   Smokeless tobacco: Never  Vaping Use   Vaping status: Never Used  Substance Use Topics   Alcohol use: No    Alcohol/week: 0.0 standard drinks of alcohol   Drug use: No       OPHTHALMIC EXAM:  Base Eye Exam     Visual Acuity (Snellen - Linear)       Right Left   Dist cc 20/25 -2 20/25 +1   Dist ph cc NI NI    Correction: Glasses         Tonometry (Tonopen, 2:02 PM)       Right Left   Pressure 18 15         Pupils       Dark Light Shape React APD   Right 3 2 Round Brisk None   Left 3 2 Round Brisk None         Visual Fields (Counting fingers)       Left Right    Full Full         Extraocular Movement       Right Left    Full, Ortho Full, Ortho  Neuro/Psych     Oriented x3: Yes    Mood/Affect: Normal         Dilation     Both eyes: 1.0% Mydriacyl, 2.5% Phenylephrine @ 2:02 PM           Slit Lamp and Fundus Exam     Slit Lamp Exam       Right Left   Lids/Lashes Dermatochalasis - upper lid Dermatochalasis - upper lid, Meibomian gland dysfunction   Conjunctiva/Sclera White and quiet White and quiet   Cornea Arcus, trace tear film debris Arcus, trace tear film debris, well healed cataract wound   Anterior Chamber Deep, clear, narrow temporal angle deep and clear, no cell or flare   Iris Round and dilated, No NVI Round and dilated, No NVI   Lens 2-3+ Nuclear sclerosis, 2-3+ Cortical cataract PCIOL in good position   Anterior Vitreous Vitreous syneresis mild syneresis, Posterior vitreous detachment         Fundus Exam       Right Left   Disc Pink and sharp, Mild Peripapillary pigmentation and atrophy, No NVD Pink and Sharp, PPP, 360 PPA   C/D Ratio 0.5 0.2   Macula Flat, good foveal reflex, Retinal pigment epithelial mottling and clumping, No heme or edema Flat, Blunted foveal reflex, RPE mottling and clumping, No heme or edema   Vessels attenuated, mild tortuosity mild attenuation, mild tortuosity   Periphery Attached, No heme Attached, No heme           Refraction     Wearing Rx       Sphere Cylinder Axis   Right -2.25 +0.50 018   Left -2.00 +0.50 067           IMAGING AND PROCEDURES  Imaging and Procedures for 11/27/17  OCT, Retina - OU - Both Eyes       Right Eye Quality was good. Central Foveal Thickness: 226. Progression has been stable. Findings include normal foveal contour, no IRF, no SRF.   Left Eye Quality was good. Central Foveal Thickness: 235. Progression has been stable. Findings include normal foveal contour, no IRF, no SRF (Peripapillary ORA).   Notes *Images captured and stored on drive  Diagnosis / Impression: NFP, No IRF/SRF No DME OU OS: Peripapillary ORA  Clinical management:  See  below  Abbreviations: NFP - Normal foveal profile. CME - cystoid macular edema. PED - pigment epithelial detachment. IRF - intraretinal fluid. SRF - subretinal fluid. EZ - ellipsoid zone. ERM - epiretinal membrane. ORA - outer retinal atrophy. ORT - outer retinal tubulation. SRHM - subretinal hyper-reflective material             ASSESSMENT/PLAN:    ICD-10-CM   1. Diabetes mellitus type 2 without retinopathy (HCC)  E11.9 OCT, Retina - OU - Both Eyes    2. Long term (current) use of oral hypoglycemic drugs  Z79.84     3. Posterior vitreous detachment of left eye  H43.812     4. Essential hypertension  I10     5. Hypertensive retinopathy of both eyes  H35.033     6. Combined forms of age-related cataract of right eye  H25.811     7. Pseudophakia, left eye  Z96.1     8. Dry eyes  H04.123     9. Meibomian gland dysfunction (MGD) of both eyes  H02.883    H02.886      1,2. Diabetes mellitus, type 2 without retinopathy -- stable  - A1c: 8.2 on  11.14.24, 7.5 on 03.01.23, 7.3 on 02.22.22  - The incidence, risk factors for progression, natural history and treatment options for diabetic retinopathy  were discussed with patient.    - The need for close monitoring of blood glucose, blood pressure, and serum lipids, avoiding cigarette or any type of tobacco, and the need for long term follow up was also discussed with patient.   - f/u in 1 yr for annual diabetic eye exam -- DFE/OCT  3. PVD OS  Long-standing, floater OS -- unchanged  Discussed findings and prognosis  No RT or RD on 360 peripheral exam  Reviewed s/s of RT/RD  Strict return precautions for any such RT/RD symptoms  4,5. Hypertensive retinopathy OU - discussed importance of tight BP control - monitor  6. Combined form age-related cataract OD  - The symptoms of cataract, surgical options, and treatments and risks were discussed with patient.  - under the expert management of Dr. Zenaida Niece   - surgery scheduled for  04.08.24  7. Pseudophakia OS  - s/p CE/IOL OS (Dr. Zenaida Niece)  - IOL in good position, doing well  - monitor   8,9. Dry Eyes / MGD OU  - recommend artificial tears and lubricating ointment as needed   - recommend hot compresses and lid scrubs  Ophthalmic Meds Ordered this visit:  No orders of the defined types were placed in this encounter.    Return in about 1 year (around 12/29/2024) for annual diabetic eye exam - DFE, OCT.  There are no Patient Instructions on file for this visit.  Explained the diagnoses, plan, and follow up with the patient and they expressed understanding.  Patient expressed understanding of the importance of proper follow up care.   This document serves as a record of services personally performed by Karie Chimera, MD, PhD. It was created on their behalf by Charlette Caffey, COT an ophthalmic technician. The creation of this record is the provider's dictation and/or activities during the visit.    Electronically signed by:  Charlette Caffey, COT  12/30/23 6:50 PM  This document serves as a record of services personally performed by Karie Chimera, MD, PhD. It was created on their behalf by Annalee Genta, COMT. The creation of this record is the provider's dictation and/or activities during the visit.  Electronically signed by: Annalee Genta, COMT 12/30/23 6:50 PM  Karie Chimera, M.D., Ph.D. Diseases & Surgery of the Retina and Vitreous [facility]  I have reviewed the above documentation for accuracy and completeness, and I agree with the above. Karie Chimera, M.D., Ph.D. 12/30/23 6:50 PM   Abbreviations: M myopia (nearsighted); A astigmatism; H hyperopia (farsighted); P presbyopia; Mrx spectacle prescription;  CTL contact lenses; OD right eye; OS left eye; OU both eyes  XT exotropia; ET esotropia; PEK punctate epithelial keratitis; PEE punctate epithelial erosions; DES dry eye syndrome; MGD meibomian gland dysfunction; ATs  artificial tears; PFAT's preservative free artificial tears; NSC nuclear sclerotic cataract; PSC posterior subcapsular cataract; ERM epi-retinal membrane; PVD posterior vitreous detachment; RD retinal detachment; DM diabetes mellitus; DR diabetic retinopathy; NPDR non-proliferative diabetic retinopathy; PDR proliferative diabetic retinopathy; CSME clinically significant macular edema; DME diabetic macular edema; dbh dot blot hemorrhages; CWS cotton wool spot; POAG primary open angle glaucoma; C/D cup-to-disc ratio; HVF humphrey visual field; GVF goldmann visual field; OCT optical coherence tomography; IOP intraocular pressure; BRVO Branch retinal vein occlusion; CRVO central retinal vein occlusion; CRAO central retinal artery occlusion; BRAO branch retinal  artery occlusion; RT retinal tear; SB scleral buckle; PPV pars plana vitrectomy; VH Vitreous hemorrhage; PRP panretinal laser photocoagulation; IVK intravitreal kenalog; VMT vitreomacular traction; MH Macular hole;  NVD neovascularization of the disc; NVE neovascularization elsewhere; AREDS age related eye disease study; ARMD age related macular degeneration; POAG primary open angle glaucoma; EBMD epithelial/anterior basement membrane dystrophy; ACIOL anterior chamber intraocular lens; IOL intraocular lens; PCIOL posterior chamber intraocular lens; Phaco/IOL phacoemulsification with intraocular lens placement; PRK photorefractive keratectomy; LASIK laser assisted in situ keratomileusis; HTN hypertension; DM diabetes mellitus; COPD chronic obstructive pulmonary disease

## 2023-12-17 ENCOUNTER — Ambulatory Visit: Payer: PRIVATE HEALTH INSURANCE | Admitting: Nurse Practitioner

## 2023-12-30 ENCOUNTER — Encounter (INDEPENDENT_AMBULATORY_CARE_PROVIDER_SITE_OTHER): Payer: Self-pay | Admitting: Ophthalmology

## 2023-12-30 ENCOUNTER — Ambulatory Visit (INDEPENDENT_AMBULATORY_CARE_PROVIDER_SITE_OTHER): Payer: Medicare Other | Admitting: Ophthalmology

## 2023-12-30 DIAGNOSIS — Z7984 Long term (current) use of oral hypoglycemic drugs: Secondary | ICD-10-CM

## 2023-12-30 DIAGNOSIS — I10 Essential (primary) hypertension: Secondary | ICD-10-CM

## 2023-12-30 DIAGNOSIS — H04123 Dry eye syndrome of bilateral lacrimal glands: Secondary | ICD-10-CM | POA: Diagnosis not present

## 2023-12-30 DIAGNOSIS — H43812 Vitreous degeneration, left eye: Secondary | ICD-10-CM | POA: Diagnosis not present

## 2023-12-30 DIAGNOSIS — Z961 Presence of intraocular lens: Secondary | ICD-10-CM

## 2023-12-30 DIAGNOSIS — H25811 Combined forms of age-related cataract, right eye: Secondary | ICD-10-CM

## 2023-12-30 DIAGNOSIS — E119 Type 2 diabetes mellitus without complications: Secondary | ICD-10-CM

## 2023-12-30 DIAGNOSIS — H35033 Hypertensive retinopathy, bilateral: Secondary | ICD-10-CM

## 2023-12-30 DIAGNOSIS — H25813 Combined forms of age-related cataract, bilateral: Secondary | ICD-10-CM

## 2023-12-30 DIAGNOSIS — H02883 Meibomian gland dysfunction of right eye, unspecified eyelid: Secondary | ICD-10-CM

## 2024-01-06 ENCOUNTER — Other Ambulatory Visit: Payer: Self-pay | Admitting: Family Medicine

## 2024-01-12 ENCOUNTER — Encounter: Payer: Self-pay | Admitting: Family Medicine

## 2024-01-12 ENCOUNTER — Other Ambulatory Visit: Payer: Self-pay | Admitting: Neurology

## 2024-01-12 ENCOUNTER — Ambulatory Visit (INDEPENDENT_AMBULATORY_CARE_PROVIDER_SITE_OTHER): Payer: Medicare Other | Admitting: Family Medicine

## 2024-01-12 VITALS — BP 126/78 | HR 84 | Temp 98.4°F | Ht 65.4 in | Wt 178.4 lb

## 2024-01-12 DIAGNOSIS — Z13820 Encounter for screening for osteoporosis: Secondary | ICD-10-CM

## 2024-01-12 DIAGNOSIS — G2581 Restless legs syndrome: Secondary | ICD-10-CM | POA: Diagnosis not present

## 2024-01-12 DIAGNOSIS — Z780 Asymptomatic menopausal state: Secondary | ICD-10-CM | POA: Diagnosis not present

## 2024-01-12 DIAGNOSIS — Z23 Encounter for immunization: Secondary | ICD-10-CM

## 2024-01-12 DIAGNOSIS — Z7984 Long term (current) use of oral hypoglycemic drugs: Secondary | ICD-10-CM | POA: Diagnosis not present

## 2024-01-12 DIAGNOSIS — E1122 Type 2 diabetes mellitus with diabetic chronic kidney disease: Secondary | ICD-10-CM

## 2024-01-12 DIAGNOSIS — I10 Essential (primary) hypertension: Secondary | ICD-10-CM

## 2024-01-12 DIAGNOSIS — E785 Hyperlipidemia, unspecified: Secondary | ICD-10-CM | POA: Diagnosis not present

## 2024-01-12 DIAGNOSIS — N1831 Chronic kidney disease, stage 3a: Secondary | ICD-10-CM

## 2024-01-12 DIAGNOSIS — Z7689 Persons encountering health services in other specified circumstances: Secondary | ICD-10-CM | POA: Insufficient documentation

## 2024-01-12 DIAGNOSIS — E559 Vitamin D deficiency, unspecified: Secondary | ICD-10-CM

## 2024-01-12 DIAGNOSIS — E1169 Type 2 diabetes mellitus with other specified complication: Secondary | ICD-10-CM

## 2024-01-12 DIAGNOSIS — R87629 Unspecified abnormal cytological findings in specimens from vagina: Secondary | ICD-10-CM | POA: Insufficient documentation

## 2024-01-12 DIAGNOSIS — N183 Chronic kidney disease, stage 3 unspecified: Secondary | ICD-10-CM | POA: Diagnosis not present

## 2024-01-12 MED ORDER — ROPINIROLE HCL 3 MG PO TABS: 3.0000 mg | ORAL_TABLET | Freq: Every day | ORAL | 3 refills | Status: DC

## 2024-01-12 MED ORDER — LISINOPRIL 20 MG PO TABS
20.0000 mg | ORAL_TABLET | Freq: Every day | ORAL | 3 refills | Status: AC
Start: 2024-01-12 — End: ?

## 2024-01-12 NOTE — Assessment & Plan Note (Signed)
 Stable, labs today. Push fluids. Avoid NSAIDs. Medications renally dosed.

## 2024-01-12 NOTE — Assessment & Plan Note (Signed)
 Chronic well controlled. Anxious when driving to OVs. Continue Lisinopril 20mg  daily. Recommend heart healthy diet such as Mediterranean diet with whole grains, fruits, vegetable, fish, lean meats, nuts, and olive oil. Limit salt. Encouraged moderate walking, 3-5 times/week for 30-50 minutes each session. Aim for at least 150 minutes.week. Goal should be pace of 3 miles/hours, or walking 1.5 miles in 30 minutes. Avoid tobacco products. Avoid excess alcohol. Take medications as prescribed and bring medications and blood pressure log with cuff to each office visit. Seek medical care for chest pain, palpitations, shortness of breath with exertion, dizziness/lightheadedness, vision changes, recurrent headaches, or swelling of extremities. Follow up in 3 months

## 2024-01-12 NOTE — Assessment & Plan Note (Addendum)
 Refill provided for Requip 3mg  nightly, CBC today

## 2024-01-12 NOTE — Assessment & Plan Note (Signed)
 Statin intolerant. Continue Zetia 10mg  daily. Your labs showed elevated cholesterol. I recommend consuming a heart healthy diet such as Mediterranean diet or DASH diet with whole grains, fruits, vegetable, fish, lean meats, nuts, and olive oil. Limit sweets and processed foods. I also encourage moderate intensity exercise 150 minutes weekly. This is 3-5 times weekly for 30-50 minutes each session. Goal should be pace of 3 miles/hours, or walking 1.5 miles in 30 minutes.

## 2024-01-12 NOTE — Assessment & Plan Note (Signed)
 Last A1c 8.2% Taking Glipizide 5mg  TIDWC. Unable to tolerate Metformin. Will consider GLP but does not want injections. A1c and uACR today. Had retinal exam last week. A1c and uACR today. Foot exam UTD. Vaccines UTD. Retinal eye exam UTD. Recommend heart healthy diet such as Mediterranean diet with whole grains, fruits, vegetable, fish, lean meats, nuts, and olive oil. Limit salt. Encouraged moderate walking, 3-5 times/week for 30-50 minutes each session. Aim for at least 150 minutes.week. Goal should be pace of 3 miles/hours, or walking 1.5 miles in 30 minutes. Seek medical care for urinary frequency, extreme thirst, vision changes, lightheadedness, dizziness.  Follow up in 3 months.

## 2024-01-12 NOTE — Progress Notes (Signed)
 New Patient Office Visit  Subjective    Patient ID: Rachel Daniel, female    DOB: May 20, 1951  Age: 73 y.o. MRN: [id number]  CC:  Chief Complaint  Patient presents with   Establish Care    HPI Rachel Daniel presents to establish care. Oriented to practice routines and expectations. PMH as listed below. Has been seeing PCP regularly, last OV 08/28/2023 and CPE 05/12/2023. She is currently taking Glipizide 5mg  TIDWC. Does not tolerate Metformin due to GI upset. Eye exams are up to date last week, between cataract surgeries.  Breast CA screening: Mammogram status: Completed 09/08/2023. Repeat every year Cervical CA screening:  no longer due Colon CA screening: colonoscopy 1 years ago without abnormalities. Due 2027 DEXA: DEXA scan ordered to follow up existing osteopenia. Tobacco: non-smoker Vaccines:  shingles today, tdap needed (unavailable in office today  RLS: well controlled on Requip, cleared by Neurology Diverticulosis: high fiber diet Vitamin D Deficiency: goal 60-100 GERD: well controlled with lifestyle modifications  HYPERTENSION / HYPERLIPIDEMIA: Lisinopril, Zetia Satisfied with current treatment? yes Duration of hypertension: chronic BP monitoring frequency: rarely BP range:  BP medication side effects: no Past BP meds: lisinopril Duration of hyperlipidemia: chronic Cholesterol medication side effects: no Cholesterol supplements: none Past cholesterol medications: ezetimide (zetia) Medication compliance: excellent compliance Aspirin: yes Recent stressors: no Recurrent headaches: no Visual changes: no Palpitations: no Dyspnea: no Chest pain: no Lower extremity edema: no Dizzy/lightheaded: no  DIABETES: Glipizide Hypoglycemic episodes:no Polydipsia/polyuria: no Visual disturbance: no Chest pain: no Paresthesias: no Glucose Monitoring: no  Accucheck frequency: Not Checking  Fasting glucose:  Post prandial:  Evening:  Before meals: Taking  Insulin?: no  Long acting insulin:  Short acting insulin: Blood Pressure Monitoring: not checking Retinal Examination: Up to Date Foot Exam: Up to Date Diabetic Education: Completed Pneumovax: Up to Date Influenza: Up to Date Aspirin: yes  CHRONIC KIDNEY DISEASE CKD status:  labs today Medications renally dose: yes Previous renal evaluation: no Pneumovax:  Up to Date Influenza Vaccine:  Up to Date    Outpatient Encounter Medications as of 01/12/2024  Medication Sig   aspirin EC 81 MG tablet Take 81 mg by mouth daily.   CRANBERRY EXTRACT PO Take 2 tablets by mouth daily.   ezetimibe (ZETIA) 10 MG tablet Take 1 tablet (10 mg total) by mouth daily.   FIBER ADULT GUMMIES PO Take by mouth daily.   glipiZIDE (GLUCOTROL) 5 MG tablet TAKE 1/2 TO 1 TABLET BY MOUTH THREE TIMES DAILY WITH MEALS FOR DIABETES   Multiple Vitamins-Minerals (MULTIVITAMIN PO) Take by mouth daily.   OVER THE COUNTER MEDICATION Uses OTC eye drops for dry eyes.   Phenazopyridine HCl (AZO TABS PO) Take 2 tablets by mouth daily.   rOPINIRole (REQUIP) 1 MG tablet TAKE 1 TABLET BY MOUTH THREE TIMES DAILY WITH MEALS FOR RESTLESS LEGS   VITAMIN D, CHOLECALCIFEROL, PO Take 4,000 Units by mouth daily.   [DISCONTINUED] lisinopril (ZESTRIL) 20 MG tablet Take 1 tablet (20 mg total) by mouth daily. for blood pressure   [DISCONTINUED] rOPINIRole (REQUIP) 3 MG tablet Take 1 tablet (3 mg total) by mouth at bedtime.   calcium carbonate (OS-CAL) 600 MG tablet Take by mouth. (Patient not taking: Reported on 01/12/2024)   lisinopril (ZESTRIL) 20 MG tablet Take 1 tablet (20 mg total) by mouth daily. for blood pressure   rOPINIRole (REQUIP) 3 MG tablet Take 1 tablet (3 mg total) by mouth at bedtime.   No facility-administered encounter medications on file  as of 01/12/2024.    Past Medical History:  Diagnosis Date   Allergy    otc med prn   Arthritis    hips, lower back   Cataract    bilateral   Chronic kidney disease    stage  3 per patient    Diet-controlled type 2 diabetes mellitus (HCC)    Diet controlled, no meds   Diverticulosis    Dribbling urine    EKG abnormalities    pt states she had a BBB on her last EKG, no problems   Essential hypertension, benign 08/19/2013   HPV in female    Hyperlipidemia    diet controlled, no med   Restless leg syndrome    Snoring    Vitamin D deficiency     Past Surgical History:  Procedure Laterality Date   BREAST BIOPSY Left 1969   benign   BREAST EXCISIONAL BIOPSY Left 1969   benign   COLONOSCOPY  2012   kaplan-polyps, tics   LUMBAR LAMINECTOMY/DECOMPRESSION MICRODISCECTOMY  05/07/2012   Procedure: LUMBAR LAMINECTOMY/DECOMPRESSION MICRODISCECTOMY;  Surgeon: Emilee Hero, MD;  Location: [facility] OR;  Service: Orthopedics;  Laterality: Bilateral;  Lumbar 4-5 decompression   OVARIAN CYST REMOVAL  1981   right   TUBAL LIGATION  1981   VAGINAL HYSTERECTOMY  1992    Family History  Problem Relation Age of Onset   Hypertension Mother    Diabetes Mother    Stroke Mother    Heart failure Mother    Colon cancer Mother 41   Cataracts Mother    Heart failure Father 53   Kidney disease Father    Stroke Sister 60   Colon polyps Sister    Atrial fibrillation Sister    Diabetes Sister    Atrial fibrillation Sister    Diabetes Sister    Atrial fibrillation Sister    Breast cancer Sister        >50   Diabetes Paternal Grandfather    Colon polyps Brother    Atrial fibrillation Brother    Atrial fibrillation Brother    Stomach cancer Neg Hx    Esophageal cancer Neg Hx    Rectal cancer Neg Hx    Amblyopia Neg Hx    Blindness Neg Hx    Glaucoma Neg Hx    Macular degeneration Neg Hx    Retinal detachment Neg Hx    Strabismus Neg Hx    Retinitis pigmentosa Neg Hx    BRCA 1/2 Neg Hx     Social History   Socioeconomic History   Marital status: Widowed    Spouse name: Not on file   Number of children: Not on file   Years of education: Not on file    Highest education level: Not on file  Occupational History    Employer: [employer]  Tobacco Use   Smoking status: Never    Passive exposure: Past   Smokeless tobacco: Never  Vaping Use   Vaping status: Never Used  Substance and Sexual Activity   Alcohol use: No    Alcohol/week: 0.0 standard drinks of alcohol   Drug use: No   Sexual activity: Not Currently    Birth control/protection: Post-menopausal    Comment: Hysterectomy  Other Topics Concern   Not on file  Social History Narrative   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    Lives alone.     Highest level of education:  High school   Right Handed   Social  Drivers of Corporate investment banker Strain: Not on file  Food Insecurity: Not on file  Transportation Needs: Not on file  Physical Activity: Not on file  Stress: Not on file  Social Connections: Not on file  Intimate Partner Violence: Not on file    Review of Systems  All other systems reviewed and are negative.       Objective    BP 126/78 (BP Location: Left Arm, Patient Position: Sitting, Cuff Size: Normal)   Pulse 84   Temp 98.4 F (36.9 C)   Ht 5' 5.4" (1.661 m)   Wt 178 lb 6.4 oz (80.9 kg)   SpO2 98%   BMI 29.33 kg/m     01/12/2024    2:39 PM 01/12/2024    2:08 PM 08/28/2023   10:44 AM  Vitals with BMI  Height  5' 5.4" 5' 4.5"  Weight  178 lbs 6 oz 179 lbs 3 oz  BMI  29.33 30.3  Systolic 126 140 191  Diastolic 78 72 70  Pulse  84 85      Physical Exam Vitals and nursing note reviewed.  Constitutional:      Appearance: Normal appearance. She is normal weight.  HENT:     Head: Normocephalic and atraumatic.  Cardiovascular:     Rate and Rhythm: Normal rate and regular rhythm.     Pulses: Normal pulses.     Heart sounds: Normal heart sounds.  Pulmonary:     Effort: Pulmonary effort is normal.     Breath sounds: Normal breath sounds.  Skin:    General: Skin is warm and dry.  Neurological:     General: No focal  deficit present.     Mental Status: She is alert and oriented to person, place, and time. Mental status is at baseline.  Psychiatric:        Mood and Affect: Mood normal.        Behavior: Behavior normal.        Thought Content: Thought content normal.        Judgment: Judgment normal.     Last CBC Lab Results  Component Value Date   WBC 5.7 08/28/2023   HGB 14.2 08/28/2023   HCT 42.5 08/28/2023   MCV 89.7 08/28/2023   MCH 30.0 08/28/2023   RDW 12.3 08/28/2023   PLT 172 08/28/2023   Last metabolic panel Lab Results  Component Value Date   GLUCOSE 122 (H) 08/28/2023   NA 138 08/28/2023   K 4.3 08/28/2023   CL 104 08/28/2023   CO2 28 08/28/2023   BUN 16 08/28/2023   CREATININE 1.06 (H) 08/28/2023   EGFR 56 (L) 08/28/2023   CALCIUM 9.7 08/28/2023   PROT 7.3 08/28/2023   ALBUMIN 4.2 04/15/2017   BILITOT 0.5 08/28/2023   ALKPHOS 70 04/15/2017   AST 17 08/28/2023   ALT 20 08/28/2023   Last lipids Lab Results  Component Value Date   CHOL 124 08/28/2023   HDL 40 (L) 08/28/2023   LDLCALC 59 08/28/2023   TRIG 168 (H) 08/28/2023   CHOLHDL 3.1 08/28/2023   Last hemoglobin A1c Lab Results  Component Value Date   HGBA1C 8.2 (H) 08/28/2023   Last thyroid functions Lab Results  Component Value Date   TSH 1.40 08/28/2023   Last vitamin D Lab Results  Component Value Date   VD25OH 86 08/28/2023   Last vitamin B12 and Folate Lab Results  Component Value Date   VITAMINB12 539 04/06/2015  Assessment & Plan:   Problem List Items Addressed This Visit     Restless leg syndrome   Refill provided for Requip 3mg  nightly, CBC today      Relevant Medications   rOPINIRole (REQUIP) 3 MG tablet   Hyperlipidemia associated with type 2 diabetes mellitus (HCC)   Statin intolerant. Continue Zetia 10mg  daily. Your labs showed elevated cholesterol. I recommend consuming a heart healthy diet such as Mediterranean diet or DASH diet with whole grains, fruits,  vegetable, fish, lean meats, nuts, and olive oil. Limit sweets and processed foods. I also encourage moderate intensity exercise 150 minutes weekly. This is 3-5 times weekly for 30-50 minutes each session. Goal should be pace of 3 miles/hours, or walking 1.5 miles in 30 minutes.      Relevant Medications   lisinopril (ZESTRIL) 20 MG tablet   Other Relevant Orders   Lipid panel   Vitamin D deficiency   Vitamin D level ordered      Relevant Orders   VITAMIN D 25 Hydroxy (Vit-D Deficiency, Fractures)   Essential hypertension   Chronic well controlled. Anxious when driving to OVs. Continue Lisinopril 20mg  daily. Recommend heart healthy diet such as Mediterranean diet with whole grains, fruits, vegetable, fish, lean meats, nuts, and olive oil. Limit salt. Encouraged moderate walking, 3-5 times/week for 30-50 minutes each session. Aim for at least 150 minutes.week. Goal should be pace of 3 miles/hours, or walking 1.5 miles in 30 minutes. Avoid tobacco products. Avoid excess alcohol. Take medications as prescribed and bring medications and blood pressure log with cuff to each office visit. Seek medical care for chest pain, palpitations, shortness of breath with exertion, dizziness/lightheadedness, vision changes, recurrent headaches, or swelling of extremities. Follow up in 3 months      Relevant Medications   lisinopril (ZESTRIL) 20 MG tablet   Other Relevant Orders   Comprehensive metabolic panel with GFR   CBC with Differential/Platelet   Lipid panel   Hemoglobin A1c   Type 2 diabetes mellitus with stage 3a chronic kidney disease, without long-term current use of insulin (HCC)   Last A1c 8.2% Taking Glipizide 5mg  TIDWC. Unable to tolerate Metformin. Will consider GLP but does not want injections. A1c and uACR today. Had retinal exam last week. A1c and uACR today. Foot exam UTD. Vaccines UTD. Retinal eye exam UTD. Recommend heart healthy diet such as Mediterranean diet with whole grains, fruits,  vegetable, fish, lean meats, nuts, and olive oil. Limit salt. Encouraged moderate walking, 3-5 times/week for 30-50 minutes each session. Aim for at least 150 minutes.week. Goal should be pace of 3 miles/hours, or walking 1.5 miles in 30 minutes. Seek medical care for urinary frequency, extreme thirst, vision changes, lightheadedness, dizziness.  Follow up in 3 months.       Relevant Medications   lisinopril (ZESTRIL) 20 MG tablet   Other Relevant Orders   Comprehensive metabolic panel with GFR   CBC with Differential/Platelet   Lipid panel   Hemoglobin A1c   Microalbumin / creatinine urine ratio   CKD stage 3 due to type 2 diabetes mellitus (HCC)   Stable, labs today. Push fluids. Avoid NSAIDs. Medications renally dosed.       Relevant Medications   lisinopril (ZESTRIL) 20 MG tablet   Other Relevant Orders   Comprehensive metabolic panel with GFR   CBC with Differential/Platelet   Lipid panel   Hemoglobin A1c   Encounter to establish care with new doctor - Primary   Today we reviewed your medical  history, health maintenance items, and current concerns. Will get updated DEXA. Shingles vaccine today, needs Tdap, office out of stock. Fasting labs today. Return for AWV and July CPE      Other Visit Diagnoses       Encounter for osteoporosis screening in asymptomatic postmenopausal patient       Relevant Orders   DG Bone Density       Return for ASAP AWV, CPE July, annual physical with labs 1 week prior.   Park Meo, FNP

## 2024-01-12 NOTE — Patient Instructions (Signed)
 It was great to meet you today and I'm excited to have you join the [facility] practice. I hope you had a positive experience today! If you feel so inclined, please feel free to recommend our practice to friends and family. Kurtis Bushman, FNP-C

## 2024-01-12 NOTE — Assessment & Plan Note (Signed)
 Today we reviewed your medical history, health maintenance items, and current concerns. Will get updated DEXA. Shingles vaccine today, needs Tdap, office out of stock. Fasting labs today. Return for AWV and July CPE

## 2024-01-12 NOTE — Addendum Note (Signed)
 Addended by: Venia Carbon K on: 01/12/2024 04:25 PM   Modules accepted: Orders

## 2024-01-12 NOTE — Assessment & Plan Note (Signed)
 Vitamin D level ordered.

## 2024-01-13 LAB — COMPREHENSIVE METABOLIC PANEL WITH GFR
AG Ratio: 1.4 (calc) (ref 1.0–2.5)
ALT: 17 U/L (ref 6–29)
AST: 16 U/L (ref 10–35)
Albumin: 4.4 g/dL (ref 3.6–5.1)
Alkaline phosphatase (APISO): 79 U/L (ref 37–153)
BUN/Creatinine Ratio: 14 (calc) (ref 6–22)
BUN: 15 mg/dL (ref 7–25)
CO2: 27 mmol/L (ref 20–32)
Calcium: 9.7 mg/dL (ref 8.6–10.4)
Chloride: 103 mmol/L (ref 98–110)
Creat: 1.11 mg/dL — ABNORMAL HIGH (ref 0.60–1.00)
Globulin: 3.2 g/dL (ref 1.9–3.7)
Glucose, Bld: 175 mg/dL — ABNORMAL HIGH (ref 65–99)
Potassium: 4.2 mmol/L (ref 3.5–5.3)
Sodium: 137 mmol/L (ref 135–146)
Total Bilirubin: 0.4 mg/dL (ref 0.2–1.2)
Total Protein: 7.6 g/dL (ref 6.1–8.1)
eGFR: 52 mL/min/{1.73_m2} — ABNORMAL LOW (ref >=60)

## 2024-01-13 LAB — CBC WITH DIFFERENTIAL/PLATELET
Absolute Lymphocytes: 2269 {cells}/uL (ref 850–3900)
Absolute Monocytes: 390 {cells}/uL (ref 200–950)
Basophils Absolute: 31 {cells}/uL (ref 0–200)
Basophils Relative: 0.5 %
Eosinophils Absolute: 207 {cells}/uL (ref 15–500)
Eosinophils Relative: 3.4 %
HCT: 42.7 % (ref 35.0–45.0)
Hemoglobin: 14 g/dL (ref 11.7–15.5)
MCH: 29.2 pg (ref 27.0–33.0)
MCHC: 32.8 g/dL (ref 32.0–36.0)
MCV: 89 fL (ref 80.0–100.0)
MPV: 9.6 fL (ref 7.5–12.5)
Monocytes Relative: 6.4 %
Neutro Abs: 3203 {cells}/uL (ref 1500–7800)
Neutrophils Relative %: 52.5 %
Platelets: 198 10*3/uL (ref 140–400)
RBC: 4.8 10*6/uL (ref 3.80–5.10)
RDW: 12.3 % (ref 11.0–15.0)
Total Lymphocyte: 37.2 %
WBC: 6.1 10*3/uL (ref 3.8–10.8)

## 2024-01-13 LAB — LIPID PANEL
Cholesterol: 139 mg/dL (ref <200)
HDL: 44 mg/dL — ABNORMAL LOW (ref >=50)
LDL Cholesterol (Calc): 75 mg/dL
Non-HDL Cholesterol (Calc): 95 mg/dL (ref <130)
Total CHOL/HDL Ratio: 3.2 (calc) (ref <5.0)
Triglycerides: 114 mg/dL (ref <150)

## 2024-01-13 LAB — MICROALBUMIN / CREATININE URINE RATIO
Creatinine, Urine: 66 mg/dL (ref 20–275)
Microalb Creat Ratio: 9 mg/g{creat} (ref <30)
Microalb, Ur: 0.6 mg/dL

## 2024-01-13 LAB — HEMOGLOBIN A1C
Hgb A1c MFr Bld: 8.8 %{Hb} — ABNORMAL HIGH (ref <5.7)
Mean Plasma Glucose: 206 mg/dL
eAG (mmol/L): 11.4 mmol/L

## 2024-01-13 LAB — VITAMIN D 25 HYDROXY (VIT D DEFICIENCY, FRACTURES): Vit D, 25-Hydroxy: 82 ng/mL (ref 30–100)

## 2024-01-15 ENCOUNTER — Encounter: Payer: Self-pay | Admitting: Family Medicine

## 2024-01-15 ENCOUNTER — Other Ambulatory Visit: Payer: Self-pay | Admitting: Family Medicine

## 2024-01-20 DIAGNOSIS — H25811 Combined forms of age-related cataract, right eye: Secondary | ICD-10-CM | POA: Diagnosis not present

## 2024-01-20 DIAGNOSIS — H268 Other specified cataract: Secondary | ICD-10-CM | POA: Diagnosis not present

## 2024-02-04 ENCOUNTER — Encounter: Payer: Self-pay | Admitting: Family Medicine

## 2024-02-04 ENCOUNTER — Ambulatory Visit: Admitting: Family Medicine

## 2024-02-04 VITALS — BP 126/78 | HR 82 | Resp 16 | Ht 65.5 in | Wt 175.0 lb

## 2024-02-04 DIAGNOSIS — N183 Chronic kidney disease, stage 3 unspecified: Secondary | ICD-10-CM

## 2024-02-04 DIAGNOSIS — E1169 Type 2 diabetes mellitus with other specified complication: Secondary | ICD-10-CM | POA: Diagnosis not present

## 2024-02-04 DIAGNOSIS — E1122 Type 2 diabetes mellitus with diabetic chronic kidney disease: Secondary | ICD-10-CM

## 2024-02-04 DIAGNOSIS — E785 Hyperlipidemia, unspecified: Secondary | ICD-10-CM | POA: Diagnosis not present

## 2024-02-04 DIAGNOSIS — N1831 Chronic kidney disease, stage 3a: Secondary | ICD-10-CM

## 2024-02-04 MED ORDER — RYBELSUS 3 MG PO TABS
3.0000 mg | ORAL_TABLET | Freq: Every day | ORAL | 1 refills | Status: DC
Start: 2024-02-04 — End: 2024-02-24

## 2024-02-04 NOTE — Assessment & Plan Note (Signed)
 Last A1c 8.8% Taking Glipizide  5mg  TIDWC. Unable to tolerate Metformin . Start Rybelsus  3mg  daily. Recommend heart healthy diet such as Mediterranean diet with whole grains, fruits, vegetable, fish, lean meats, nuts, and olive oil. Limit salt. Encouraged moderate walking, 3-5 times/week for 30-50 minutes each session. Aim for at least 150 minutes.week. Goal should be pace of 3 miles/hours, or walking 1.5 miles in 30 minutes. Seek medical care for urinary frequency, extreme thirst, vision changes, lightheadedness, dizziness.  Follow up in 3 months.

## 2024-02-04 NOTE — Assessment & Plan Note (Signed)
 Statin intolerant. Continue Zetia  10mg  daily. LDL 75. I recommend consuming a heart healthy diet such as Mediterranean diet or DASH diet with whole grains, fruits, vegetable, fish, lean meats, nuts, and olive oil. Limit sweets and processed foods. I also encourage moderate intensity exercise 150 minutes weekly. This is 3-5 times weekly for 30-50 minutes each session. Goal should be pace of 3 miles/hours, or walking 1.5 miles in 30 minutes.

## 2024-02-04 NOTE — Progress Notes (Signed)
 Subjective:  HPI: Rachel Daniel is a 73 y.o. female presenting on 02/04/2024 for Diabetes (Discuss last A1C)   Diabetes   Patient is in today for discussion of recent labs. Her most recent A1c was 8.8% and her LDL was above goal at 75. Creatinine was stable at 1.11 and eGFR 52. Has tried metformin  and did not tolerate due to nausea. She has eliminated bread, crackers, rice, noodles, potatoes, and sweets. Rachel Daniel walks for exercise. She is not monitoring her blood glucose. Her A1c has been uncontrolled >8.0% for last year on Glipizide .  DIABETES Hypoglycemic episodes:no Polydipsia/polyuria: no Visual disturbance: no Chest pain: no Paresthesias: no Glucose Monitoring: no   Review of Systems  All other systems reviewed and are negative.   Relevant past medical history reviewed and updated as indicated.   Past Medical History:  Diagnosis Date   Allergy    otc med prn   Arthritis    hips, lower back   Cataract    bilateral   Chronic kidney disease    stage 3 per patient    Diet-controlled type 2 diabetes mellitus (HCC)    Diet controlled, no meds   Diverticulosis    Dribbling urine    EKG abnormalities    pt states she had a BBB on her last EKG, no problems   Essential hypertension, benign 08/19/2013   HPV in female    Hyperlipidemia    diet controlled, no med   Restless leg syndrome    Snoring    Vitamin D  deficiency      Past Surgical History:  Procedure Laterality Date   BREAST BIOPSY Left 1969   benign   BREAST EXCISIONAL BIOPSY Left 1969   benign   COLONOSCOPY  2012   kaplan-polyps, tics   LUMBAR LAMINECTOMY/DECOMPRESSION MICRODISCECTOMY  05/07/2012   Procedure: LUMBAR LAMINECTOMY/DECOMPRESSION MICRODISCECTOMY;  Surgeon: Estevan Helper, MD;  Location: [facility] OR;  Service: Orthopedics;  Laterality: Bilateral;  Lumbar 4-5 decompression   OVARIAN CYST REMOVAL  1981   right   TUBAL LIGATION  1981   VAGINAL HYSTERECTOMY  1992    Allergies and  medications reviewed and updated.   Current Outpatient Medications:    aspirin EC 81 MG tablet, Take 81 mg by mouth daily., Disp: , Rfl:    CRANBERRY EXTRACT PO, Take 2 tablets by mouth daily., Disp: , Rfl:    ezetimibe  (ZETIA ) 10 MG tablet, Take 1 tablet (10 mg total) by mouth daily., Disp: 30 tablet, Rfl: 11   FIBER ADULT GUMMIES PO, Take by mouth daily., Disp: , Rfl:    lisinopril  (ZESTRIL ) 20 MG tablet, Take 1 tablet (20 mg total) by mouth daily. for blood pressure, Disp: 90 tablet, Rfl: 3   Multiple Vitamins-Minerals (MULTIVITAMIN PO), Take by mouth daily., Disp: , Rfl:    OVER THE COUNTER MEDICATION, Uses OTC eye drops for dry eyes., Disp: , Rfl:    Phenazopyridine HCl (AZO TABS PO), Take 2 tablets by mouth daily., Disp: , Rfl:    rOPINIRole  (REQUIP ) 1 MG tablet, TAKE 1 TABLET BY MOUTH THREE TIMES DAILY WITH MEALS FOR RESTLESS LEGS (Patient taking differently: Take 1 mg by mouth in the morning and at bedtime. TAKE 1 TABLET BY MOUTH THREE TIMES DAILY WITH MEALS FOR RESTLESS LEGS), Disp: 270 tablet, Rfl: 3   rOPINIRole  (REQUIP ) 3 MG tablet, Take 1 tablet (3 mg total) by mouth at bedtime., Disp: 90 tablet, Rfl: 3   VITAMIN D , CHOLECALCIFEROL, PO, Take 4,000 Units by  mouth daily., Disp: , Rfl:    calcium carbonate (OS-CAL) 600 MG tablet, Take by mouth. (Patient not taking: Reported on 02/04/2024), Disp: , Rfl:   Allergies  Allergen Reactions   Hctz [Hydrochlorothiazide]     Leg cramping   Metformin  And Related Nausea Only   Phenylbutazones Swelling   Tropicamide Swelling    Red /itching/watery eyes    Objective:   BP 126/78   Pulse 82   Resp 16   Ht 5' 5.5" (1.664 m)   Wt 175 lb 0.6 oz (79.4 kg)   SpO2 99%   BMI 28.69 kg/m      02/04/2024    3:23 PM 01/12/2024    2:39 PM 01/12/2024    2:08 PM  Vitals with BMI  Height 5' 5.5"  5' 5.4"  Weight 175 lbs 1 oz  178 lbs 6 oz  BMI 28.67  29.33  Systolic 126 126 960  Diastolic 78 78 72  Pulse 82  84     Physical Exam Vitals  and nursing note reviewed.  Constitutional:      Appearance: Normal appearance. She is normal weight.  HENT:     Head: Normocephalic and atraumatic.  Cardiovascular:     Rate and Rhythm: Normal rate and regular rhythm.     Pulses: Normal pulses.     Heart sounds: Normal heart sounds.  Pulmonary:     Effort: Pulmonary effort is normal.     Breath sounds: Normal breath sounds.  Skin:    General: Skin is warm and dry.  Neurological:     General: No focal deficit present.     Mental Status: She is alert and oriented to person, place, and time. Mental status is at baseline.  Psychiatric:        Mood and Affect: Mood normal.        Behavior: Behavior normal.        Thought Content: Thought content normal.        Judgment: Judgment normal.     Assessment & Plan:  Type 2 diabetes mellitus with stage 3a chronic kidney disease, without long-term current use of insulin  (HCC) Assessment & Plan: Last A1c 8.8% Taking Glipizide  5mg  TIDWC. Unable to tolerate Metformin . Start Rybelsus  3mg  daily. Recommend heart healthy diet such as Mediterranean diet with whole grains, fruits, vegetable, fish, lean meats, nuts, and olive oil. Limit salt. Encouraged moderate walking, 3-5 times/week for 30-50 minutes each session. Aim for at least 150 minutes.week. Goal should be pace of 3 miles/hours, or walking 1.5 miles in 30 minutes. Seek medical care for urinary frequency, extreme thirst, vision changes, lightheadedness, dizziness.  Follow up in 3 months.    Hyperlipidemia associated with type 2 diabetes mellitus (HCC) Assessment & Plan: Statin intolerant. Continue Zetia  10mg  daily. LDL 75. I recommend consuming a heart healthy diet such as Mediterranean diet or DASH diet with whole grains, fruits, vegetable, fish, lean meats, nuts, and olive oil. Limit sweets and processed foods. I also encourage moderate intensity exercise 150 minutes weekly. This is 3-5 times weekly for 30-50 minutes each session. Goal should  be pace of 3 miles/hours, or walking 1.5 miles in 30 minutes.   CKD stage 3 due to type 2 diabetes mellitus (HCC) Assessment & Plan: Stable, Creatinine 1.11, GFR 52. Push fluids. Avoid NSAIDs and salt      Follow up plan: Return for for CPE.  Jenelle Mis, FNP

## 2024-02-04 NOTE — Assessment & Plan Note (Signed)
 Stable, Creatinine 1.11, GFR 52. Push fluids. Avoid NSAIDs and salt

## 2024-02-20 ENCOUNTER — Telehealth: Payer: Self-pay

## 2024-02-20 NOTE — Telephone Encounter (Signed)
 Pt's Medicare Part D quantity limit exception has been denied for her Rybelsus . Form states Rybelsus  3 mg is intended for initiation of treatment for  days and not effective for glycemic control.  Letter in green folder for you to review. Thanks.

## 2024-02-24 ENCOUNTER — Other Ambulatory Visit: Payer: Self-pay | Admitting: Family Medicine

## 2024-02-24 MED ORDER — RYBELSUS 3 MG PO TABS: 3.0000 mg | ORAL_TABLET | Freq: Every day | ORAL | 0 refills | Status: DC

## 2024-03-30 ENCOUNTER — Ambulatory Visit: Admitting: Family Medicine

## 2024-05-03 ENCOUNTER — Other Ambulatory Visit: Payer: Self-pay | Admitting: Nurse Practitioner

## 2024-05-03 DIAGNOSIS — E1169 Type 2 diabetes mellitus with other specified complication: Secondary | ICD-10-CM

## 2024-05-04 ENCOUNTER — Encounter: Payer: PRIVATE HEALTH INSURANCE | Admitting: Nurse Practitioner

## 2024-05-10 ENCOUNTER — Other Ambulatory Visit

## 2024-05-10 DIAGNOSIS — E1122 Type 2 diabetes mellitus with diabetic chronic kidney disease: Secondary | ICD-10-CM | POA: Diagnosis not present

## 2024-05-10 DIAGNOSIS — E1169 Type 2 diabetes mellitus with other specified complication: Secondary | ICD-10-CM | POA: Diagnosis not present

## 2024-05-10 DIAGNOSIS — N1831 Chronic kidney disease, stage 3a: Secondary | ICD-10-CM | POA: Diagnosis not present

## 2024-05-10 DIAGNOSIS — N183 Chronic kidney disease, stage 3 unspecified: Secondary | ICD-10-CM | POA: Diagnosis not present

## 2024-05-10 DIAGNOSIS — E785 Hyperlipidemia, unspecified: Secondary | ICD-10-CM | POA: Diagnosis not present

## 2024-05-11 ENCOUNTER — Encounter: Payer: PRIVATE HEALTH INSURANCE | Admitting: Nurse Practitioner

## 2024-05-11 LAB — COMPLETE METABOLIC PANEL WITHOUT GFR
AG Ratio: 1.4 (calc) (ref 1.0–2.5)
ALT: 16 U/L (ref 6–29)
AST: 14 U/L (ref 10–35)
Albumin: 4.2 g/dL (ref 3.6–5.1)
Alkaline phosphatase (APISO): 70 U/L (ref 37–153)
BUN/Creatinine Ratio: 19 (calc) (ref 6–22)
BUN: 23 mg/dL (ref 7–25)
CO2: 25 mmol/L (ref 20–32)
Calcium: 9.2 mg/dL (ref 8.6–10.4)
Chloride: 104 mmol/L (ref 98–110)
Creat: 1.18 mg/dL — ABNORMAL HIGH (ref 0.60–1.00)
Globulin: 2.9 g/dL (ref 1.9–3.7)
Glucose, Bld: 154 mg/dL — ABNORMAL HIGH (ref 65–99)
Potassium: 4.5 mmol/L (ref 3.5–5.3)
Sodium: 139 mmol/L (ref 135–146)
Total Bilirubin: 0.4 mg/dL (ref 0.2–1.2)
Total Protein: 7.1 g/dL (ref 6.1–8.1)

## 2024-05-11 LAB — CBC WITH DIFFERENTIAL/PLATELET
Absolute Lymphocytes: 1859 {cells}/uL (ref 850–3900)
Absolute Monocytes: 330 {cells}/uL (ref 200–950)
Basophils Absolute: 28 {cells}/uL (ref 0–200)
Basophils Relative: 0.5 %
Eosinophils Absolute: 179 {cells}/uL (ref 15–500)
Eosinophils Relative: 3.2 %
HCT: 42.7 % (ref 35.0–45.0)
Hemoglobin: 13.5 g/dL (ref 11.7–15.5)
MCH: 28.9 pg (ref 27.0–33.0)
MCHC: 31.6 g/dL — ABNORMAL LOW (ref 32.0–36.0)
MCV: 91.4 fL (ref 80.0–100.0)
MPV: 9.5 fL (ref 7.5–12.5)
Monocytes Relative: 5.9 %
Neutro Abs: 3203 {cells}/uL (ref 1500–7800)
Neutrophils Relative %: 57.2 %
Platelets: 177 Thousand/uL (ref 140–400)
RBC: 4.67 Million/uL (ref 3.80–5.10)
RDW: 12.6 % (ref 11.0–15.0)
Total Lymphocyte: 33.2 %
WBC: 5.6 Thousand/uL (ref 3.8–10.8)

## 2024-05-11 LAB — MICROALBUMIN / CREATININE URINE RATIO
Creatinine, Urine: 108 mg/dL (ref 20–275)
Microalb, Ur: <0.2 mg/dL

## 2024-05-11 LAB — LIPID PANEL
Cholesterol: 127 mg/dL (ref <200)
HDL: 42 mg/dL — ABNORMAL LOW (ref >=50)
LDL Cholesterol (Calc): 69 mg/dL
Non-HDL Cholesterol (Calc): 85 mg/dL (ref <130)
Total CHOL/HDL Ratio: 3 (calc) (ref <5.0)
Triglycerides: 77 mg/dL (ref <150)

## 2024-05-11 LAB — HEMOGLOBIN A1C
Hgb A1c MFr Bld: 8.5 % — ABNORMAL HIGH (ref <5.7)
Mean Plasma Glucose: 197 mg/dL
eAG (mmol/L): 10.9 mmol/L

## 2024-05-13 ENCOUNTER — Ambulatory Visit: Admitting: Family Medicine

## 2024-05-13 ENCOUNTER — Encounter: Payer: Self-pay | Admitting: Family Medicine

## 2024-05-13 VITALS — BP 118/68 | HR 73 | Temp 98.5°F | Ht 65.5 in | Wt 174.0 lb

## 2024-05-13 DIAGNOSIS — E1169 Type 2 diabetes mellitus with other specified complication: Secondary | ICD-10-CM

## 2024-05-13 DIAGNOSIS — Z1231 Encounter for screening mammogram for malignant neoplasm of breast: Secondary | ICD-10-CM

## 2024-05-13 DIAGNOSIS — Z780 Asymptomatic menopausal state: Secondary | ICD-10-CM | POA: Diagnosis not present

## 2024-05-13 DIAGNOSIS — E1122 Type 2 diabetes mellitus with diabetic chronic kidney disease: Secondary | ICD-10-CM | POA: Diagnosis not present

## 2024-05-13 DIAGNOSIS — Z0001 Encounter for general adult medical examination with abnormal findings: Secondary | ICD-10-CM | POA: Diagnosis not present

## 2024-05-13 DIAGNOSIS — E785 Hyperlipidemia, unspecified: Secondary | ICD-10-CM | POA: Diagnosis not present

## 2024-05-13 DIAGNOSIS — Z23 Encounter for immunization: Secondary | ICD-10-CM

## 2024-05-13 DIAGNOSIS — Z13820 Encounter for screening for osteoporosis: Secondary | ICD-10-CM

## 2024-05-13 DIAGNOSIS — I10 Essential (primary) hypertension: Secondary | ICD-10-CM | POA: Diagnosis not present

## 2024-05-13 DIAGNOSIS — N1831 Chronic kidney disease, stage 3a: Secondary | ICD-10-CM

## 2024-05-13 DIAGNOSIS — L989 Disorder of the skin and subcutaneous tissue, unspecified: Secondary | ICD-10-CM | POA: Diagnosis not present

## 2024-05-13 DIAGNOSIS — Z0000 Encounter for general adult medical examination without abnormal findings: Secondary | ICD-10-CM | POA: Insufficient documentation

## 2024-05-13 DIAGNOSIS — G2581 Restless legs syndrome: Secondary | ICD-10-CM | POA: Diagnosis not present

## 2024-05-13 DIAGNOSIS — E559 Vitamin D deficiency, unspecified: Secondary | ICD-10-CM | POA: Diagnosis not present

## 2024-05-13 MED ORDER — EZETIMIBE 10 MG PO TABS: 10.0000 mg | ORAL_TABLET | Freq: Every day | ORAL | 2 refills | Status: AC

## 2024-05-13 MED ORDER — RYBELSUS 3 MG PO TABS: 3.0000 mg | ORAL_TABLET | Freq: Every day | ORAL | 0 refills | Status: DC

## 2024-05-13 NOTE — Assessment & Plan Note (Signed)
 A1c 8.5% Taking Glipizide  5mg  TIDWC. Unable to tolerate Metformin . Started Rybelsus  3mg  daily for 1 month and was unable to obtain refills., resent to pharmacy. Recommend heart healthy diet such as Mediterranean diet with whole grains, fruits, vegetable, fish, lean meats, nuts, and olive oil. Limit salt. Encouraged moderate walking, 3-5 times/week for 30-50 minutes each session. Aim for at least 150 minutes.week. Goal should be pace of 3 miles/hours, or walking 1.5 miles in 30 minutes. Seek medical care for urinary frequency, extreme thirst, vision changes, lightheadedness, dizziness.  Follow up in 3 months.

## 2024-05-13 NOTE — Assessment & Plan Note (Signed)
 Hyperkeratotic lesion concerning for BCC left cheek, referral to derm

## 2024-05-13 NOTE — Assessment & Plan Note (Signed)
 Statin intolerant. Continue Zetia  10mg  daily. LDL 68. I recommend consuming a heart healthy diet such as Mediterranean diet or DASH diet with whole grains, fruits, vegetable, fish, lean meats, nuts, and olive oil. Limit sweets and processed foods. I also encourage moderate intensity exercise 150 minutes weekly. This is 3-5 times weekly for 30-50 minutes each session. Goal should be pace of 3 miles/hours, or walking 1.5 miles in 30 minutes.

## 2024-05-13 NOTE — Assessment & Plan Note (Signed)
Vitamin D level WNL.

## 2024-05-13 NOTE — Assessment & Plan Note (Signed)
 Continue Requip  3mg  nightly, CBC WNL

## 2024-05-13 NOTE — Patient Instructions (Signed)
  Rachel Daniel , Thank you for taking time to come for your Medicare Wellness Visit. I appreciate your ongoing commitment to your health goals. Please review the following plan we discussed and let me know if I can assist you in the future.   These are the goals we discussed:  Goals       Exercise 150 min/wk Moderate Activity      Patient Stated (pt-stated)      Pt. Stated she would like to work on [employer] fast streamer and play memory building games.       Weight (lb) < 165 lb (74.8 kg)        This is a list of the screening recommended for you and due dates:  Health Maintenance  Topic Date Due   DEXA scan (bone density measurement)  08/10/2022   Medicare Annual Wellness Visit  12/17/2023   Zoster (Shingles) Vaccine (2 of 2) 03/08/2024   Eye exam for diabetics  05/13/2024*   COVID-19 Vaccine (5 - 2024-25 season) 05/29/2024*   DTaP/Tdap/Td vaccine (2 - Td or Tdap) 01/11/2025*   Flu Shot  05/14/2024   Mammogram  09/07/2024   Hemoglobin A1C  11/10/2024   Yearly kidney function blood test for diabetes  05/10/2025   Yearly kidney health urinalysis for diabetes  05/10/2025   Complete foot exam   05/13/2025   Colon Cancer Screening  07/12/2026   Pneumococcal Vaccine for age over 62  Completed   Hepatitis C Screening  Completed   Hepatitis B Vaccine  Aged Out   HPV Vaccine  Aged Out   Meningitis B Vaccine  Aged Out  *Topic was postponed. The date shown is not the original due date.

## 2024-05-13 NOTE — Progress Notes (Signed)
 Complete physical exam  Patient: Rachel Daniel   DOB: 05-14-1951   73 y.o. Female  MRN: [id number]  Subjective:    Chief Complaint  Patient presents with   Annual Exam    Needs mammogram and Dexa scheduled in November of this year.  Foot exam at Physical today.  Will receive shingles vaccination today     Rachel Daniel is a 73 y.o. female who presents today for a complete physical exam. She reports consuming a general diet. Home exercise routine includes walking 0.5 hrs per day. She generally feels well. She reports sleeping well. She does not have additional problems to discuss today.  Mammogram and DEXA ordered, due this year. Will have shingles vaccine today.   DIABETES Hypoglycemic episodes:no Polydipsia/polyuria: no Visual disturbance: no Chest pain: no Paresthesias: no Glucose Monitoring: no  Accucheck frequency: Not Checking  Fasting glucose:  Post prandial:  Evening:  Before meals: Taking Insulin ?: no  Long acting insulin :  Short acting insulin : Blood Pressure Monitoring: not checking Retinal Examination: Up to Date Foot Exam: Not up to Date Diabetic Education: Completed Pneumovax: Up to Date Influenza: Up to Date Aspirin: no  HYPERTENSION / HYPERLIPIDEMIA Satisfied with current treatment? yes Duration of hypertension: chronic BP monitoring frequency: not checking BP range:  BP medication side effects: no Past BP meds: lisinopril  Duration of hyperlipidemia: chronic Cholesterol medication side effects: no Cholesterol supplements: none Past cholesterol medications:  zetia  Medication compliance: excellent compliance Aspirin: yes Recent stressors: no Recurrent headaches: no Visual changes: no Palpitations: no Dyspnea: no Chest pain: no Lower extremity edema: no Dizzy/lightheaded: no  RLS: well controlled on Requip , cleared by Neurology Diverticulosis: high fiber diet Vitamin D  Deficiency: goal 60-100 GERD: well controlled with lifestyle  modifications   Most recent fall risk assessment:    05/13/2024    9:48 AM  Fall Risk   Falls in the past year? 0  Number falls in past yr: 0  Injury with Fall? 0  Risk for fall due to : No Fall Risks  Follow up Falls evaluation completed     Most recent depression screenings:    02/04/2024    3:29 PM 02/04/2024    3:28 PM  PHQ 2/9 Scores  PHQ - 2 Score 0 0  PHQ- 9 Score 0     Vision:Within last year and Dental: No current dental problems and Receives regular dental care  Patient Active Problem List   Diagnosis Date Noted   Skin lesion of cheek 05/13/2024   Physical exam, annual 05/13/2024   Abnormal Papanicolaou smear of vagina 01/12/2024   Encounter to establish care with new doctor 01/12/2024   CKD stage 3 due to type 2 diabetes mellitus (HCC) 05/02/2021   Osteopenia 08/10/2020   Medication management 11/16/2017   Obesity (BMI 30.0-34.9) 11/16/2017   Type 2 diabetes mellitus with stage 3a chronic kidney disease, without long-term current use of insulin  (HCC) 11/16/2017   Diabetic neuropathy (HCC) 04/05/2016   Essential hypertension 08/19/2013   Restless leg syndrome    Hyperlipidemia associated with type 2 diabetes mellitus (HCC)    Vitamin D  deficiency    Diverticulosis of large intestine 09/08/2009   Past Medical History:  Diagnosis Date   Allergy    otc med prn   Arthritis    hips, lower back   Cataract    bilateral   Chronic kidney disease    stage 3 per patient    Diet-controlled type 2 diabetes mellitus (HCC)    Diet  controlled, no meds   Diverticulosis    Dribbling urine    EKG abnormalities    pt states she had a BBB on her last EKG, no problems   Essential hypertension, benign 08/19/2013   HPV in female    Hyperlipidemia    diet controlled, no med   Restless leg syndrome    Snoring    Vitamin D  deficiency    Past Surgical History:  Procedure Laterality Date   BREAST BIOPSY Left 1969   benign   BREAST EXCISIONAL BIOPSY Left 1969    benign   COLONOSCOPY  2012   kaplan-polyps, tics   LUMBAR LAMINECTOMY/DECOMPRESSION MICRODISCECTOMY  05/07/2012   Procedure: LUMBAR LAMINECTOMY/DECOMPRESSION MICRODISCECTOMY;  Surgeon: Oneil Rodgers Priestly, MD;  Location: [facility] OR;  Service: Orthopedics;  Laterality: Bilateral;  Lumbar 4-5 decompression   OVARIAN CYST REMOVAL  1981   right   TUBAL LIGATION  1981   VAGINAL HYSTERECTOMY  1992   Social History   Tobacco Use   Smoking status: Never    Passive exposure: Past   Smokeless tobacco: Never  Vaping Use   Vaping status: Never Used  Substance Use Topics   Alcohol use: No    Alcohol/week: 0.0 standard drinks of alcohol   Drug use: No   Family History  Problem Relation Age of Onset   Hypertension Mother    Diabetes Mother    Stroke Mother    Heart failure Mother    Colon cancer Mother 55   Cataracts Mother    Heart failure Father 75   Kidney disease Father    Stroke Sister 53   Colon polyps Sister    Atrial fibrillation Sister    Diabetes Sister    Atrial fibrillation Sister    Diabetes Sister    Atrial fibrillation Sister    Breast cancer Sister        >50   Diabetes Paternal Grandfather    Colon polyps Brother    Atrial fibrillation Brother    Atrial fibrillation Brother    Stomach cancer Neg Hx    Esophageal cancer Neg Hx    Rectal cancer Neg Hx    Amblyopia Neg Hx    Blindness Neg Hx    Glaucoma Neg Hx    Macular degeneration Neg Hx    Retinal detachment Neg Hx    Strabismus Neg Hx    Retinitis pigmentosa Neg Hx    BRCA 1/2 Neg Hx    Allergies  Allergen Reactions   Hctz [Hydrochlorothiazide]     Leg cramping   Metformin  And Related Nausea Only   Phenylbutazones Swelling   Tropicamide Swelling    Red /itching/watery eyes      Patient Care Team: Kayla Jeoffrey RAMAN, FNP as PCP - General (Family Medicine) Tonita Fallow, MD as PCP - Internal Medicine (Internal Medicine) Priestly Oneil, MD as Consulting Physician (Orthopedic Surgery) Patel,  Donika K, DO as Consulting Physician (Neurology)   Outpatient Medications Prior to Visit  Medication Sig   aspirin EC 81 MG tablet Take 81 mg by mouth daily.   calcium carbonate (OS-CAL) 600 MG tablet Take by mouth.   CRANBERRY EXTRACT PO Take 2 tablets by mouth daily.   FIBER ADULT GUMMIES PO Take by mouth daily.   lisinopril  (ZESTRIL ) 20 MG tablet Take 1 tablet (20 mg total) by mouth daily. for blood pressure   Multiple Vitamins-Minerals (MULTIVITAMIN PO) Take by mouth daily.   OVER THE COUNTER MEDICATION Uses OTC eye drops for dry eyes.  Phenazopyridine HCl (AZO TABS PO) Take 2 tablets by mouth daily.   rOPINIRole  (REQUIP ) 1 MG tablet TAKE 1 TABLET BY MOUTH THREE TIMES DAILY WITH MEALS FOR RESTLESS LEGS (Patient taking differently: Take 1 mg by mouth in the morning and at bedtime. TAKE 1 TABLET BY MOUTH THREE TIMES DAILY WITH MEALS FOR RESTLESS LEGS)   rOPINIRole  (REQUIP ) 3 MG tablet Take 1 tablet (3 mg total) by mouth at bedtime.   VITAMIN D , CHOLECALCIFEROL, PO Take 4,000 Units by mouth daily.   [DISCONTINUED] ezetimibe  (ZETIA ) 10 MG tablet Take 1 tablet (10 mg total) by mouth daily.   [DISCONTINUED] Semaglutide  (RYBELSUS ) 3 MG TABS Take 1 tablet (3 mg total) by mouth daily. (Patient not taking: Reported on 05/13/2024)   No facility-administered medications prior to visit.    Review of Systems  Constitutional: Negative.   HENT: Negative.    Eyes: Negative.   Respiratory: Negative.    Cardiovascular: Negative.   Gastrointestinal: Negative.   Genitourinary: Negative.   Musculoskeletal: Negative.   Skin: Negative.   Neurological: Negative.   Endo/Heme/Allergies: Negative.   Psychiatric/Behavioral: Negative.    All other systems reviewed and are negative.         Objective:     BP 118/68   Pulse 73   Temp 98.5 F (36.9 C)   Ht 5' 5.5 (1.664 m)   Wt 174 lb (78.9 kg)   SpO2 97%   BMI 28.51 kg/m  BP Readings from Last 3 Encounters:  05/13/24 118/68  02/04/24  126/78  01/12/24 126/78   Wt Readings from Last 3 Encounters:  05/13/24 174 lb (78.9 kg)  02/04/24 175 lb 0.6 oz (79.4 kg)  01/12/24 178 lb 6.4 oz (80.9 kg)      Physical Exam Vitals and nursing note reviewed.  Constitutional:      Appearance: Normal appearance. She is normal weight.  HENT:     Head: Normocephalic and atraumatic.     Right Ear: Tympanic membrane, ear canal and external ear normal.     Left Ear: Tympanic membrane, ear canal and external ear normal.     Nose: Nose normal.     Mouth/Throat:     Mouth: Mucous membranes are moist.     Pharynx: Oropharynx is clear.  Eyes:     Extraocular Movements: Extraocular movements intact.     Conjunctiva/sclera: Conjunctivae normal.     Pupils: Pupils are equal, round, and reactive to light.  Cardiovascular:     Rate and Rhythm: Normal rate and regular rhythm.     Pulses: Normal pulses.     Heart sounds: Normal heart sounds.  Pulmonary:     Effort: Pulmonary effort is normal.     Breath sounds: Normal breath sounds.  Abdominal:     General: Bowel sounds are normal.     Palpations: Abdomen is soft.  Musculoskeletal:        General: Normal range of motion.     Cervical back: Normal range of motion and neck supple.  Skin:    General: Skin is warm and dry.     Capillary Refill: Capillary refill takes less than 2 seconds.     Findings: Lesion present.     Comments: 0.2 mm hyperkeratotic lesion to left cheek  Neurological:     General: No focal deficit present.     Mental Status: She is alert and oriented to person, place, and time. Mental status is at baseline.  Psychiatric:  Mood and Affect: Mood normal.        Behavior: Behavior normal.        Thought Content: Thought content normal.        Judgment: Judgment normal.    Diabetic Foot Exam - Simple   Simple Foot Form Visual Inspection No deformities, no ulcerations, no other skin breakdown bilaterally: Yes Sensation Testing Intact to touch and monofilament  testing bilaterally: Yes Pulse Check Posterior Tibialis and Dorsalis pulse intact bilaterally: Yes Comments       No results found for any visits on 05/13/24. Last CBC Lab Results  Component Value Date   WBC 5.6 05/10/2024   HGB 13.5 05/10/2024   HCT 42.7 05/10/2024   MCV 91.4 05/10/2024   MCH 28.9 05/10/2024   RDW 12.6 05/10/2024   PLT 177 05/10/2024   Last metabolic panel Lab Results  Component Value Date   GLUCOSE 154 (H) 05/10/2024   NA 139 05/10/2024   K 4.5 05/10/2024   CL 104 05/10/2024   CO2 25 05/10/2024   BUN 23 05/10/2024   CREATININE 1.18 (H) 05/10/2024   EGFR 52 (L) 01/12/2024   CALCIUM 9.2 05/10/2024   PROT 7.1 05/10/2024   ALBUMIN 4.2 04/15/2017   BILITOT 0.4 05/10/2024   ALKPHOS 70 04/15/2017   AST 14 05/10/2024   ALT 16 05/10/2024   Last lipids Lab Results  Component Value Date   CHOL 127 05/10/2024   HDL 42 (L) 05/10/2024   LDLCALC 69 05/10/2024   TRIG 77 05/10/2024   CHOLHDL 3.0 05/10/2024   Last hemoglobin A1c Lab Results  Component Value Date   HGBA1C 8.5 (H) 05/10/2024   Last thyroid  functions Lab Results  Component Value Date   TSH 1.40 08/28/2023   Last vitamin D  Lab Results  Component Value Date   VD25OH 82 01/12/2024   Last vitamin B12 and Folate Lab Results  Component Value Date   VITAMINB12 539 04/06/2015        Assessment & Plan:    Routine Health Maintenance and Physical Exam  Immunization History  Administered Date(s) Administered   Influenza, High Dose Seasonal PF 08/01/2017, 09/22/2018, 07/20/2019, 08/06/2022, 08/28/2023   Moderna SARS-COV2 Booster Vaccination 01/12/2021   Moderna Sars-Covid-2 Vaccination 01/18/2020, 02/15/2020, 08/18/2020   PFIZER Comirnaty(Gray Top)Covid-19 Tri-Sucrose Vaccine 07/06/2021   Pneumococcal Conjugate-13 04/05/2016   Pneumococcal Polysaccharide-23 04/15/2017   Tdap 03/07/2011   Zoster Recombinant(Shingrix ) 01/12/2024    Health Maintenance  Topic Date Due   DEXA SCAN   08/10/2022   Medicare Annual Wellness (AWV)  12/17/2023   Zoster Vaccines- Shingrix  (2 of 2) 03/08/2024   OPHTHALMOLOGY EXAM  05/13/2024 (Originally 12/30/2023)   COVID-19 Vaccine (5 - 2024-25 season) 05/29/2024 (Originally 06/15/2023)   DTaP/Tdap/Td (2 - Td or Tdap) 01/11/2025 (Originally 03/06/2021)   INFLUENZA VACCINE  05/14/2024   MAMMOGRAM  09/07/2024   HEMOGLOBIN A1C  11/10/2024   Diabetic kidney evaluation - eGFR measurement  05/10/2025   Diabetic kidney evaluation - Urine ACR  05/10/2025   FOOT EXAM  05/13/2025   Colonoscopy  07/12/2026   Pneumococcal Vaccine: 50+ Years  Completed   Hepatitis C Screening  Completed   Hepatitis B Vaccines  Aged Out   HPV VACCINES  Aged Out   Meningococcal B Vaccine  Aged Out    Discussed health benefits of physical activity, and encouraged her to engage in regular exercise appropriate for her age and condition.  Problem List Items Addressed This Visit     Restless leg syndrome  Continue Requip  3mg  nightly, CBC WNL      Hyperlipidemia associated with type 2 diabetes mellitus (HCC)   Statin intolerant. Continue Zetia  10mg  daily. LDL 68. I recommend consuming a heart healthy diet such as Mediterranean diet or DASH diet with whole grains, fruits, vegetable, fish, lean meats, nuts, and olive oil. Limit sweets and processed foods. I also encourage moderate intensity exercise 150 minutes weekly. This is 3-5 times weekly for 30-50 minutes each session. Goal should be pace of 3 miles/hours, or walking 1.5 miles in 30 minutes.      Relevant Medications   ezetimibe  (ZETIA ) 10 MG tablet   Semaglutide  (RYBELSUS ) 3 MG TABS   Vitamin D  deficiency   Vitamin D  level WNL      Essential hypertension   Chronic well controlled.  Continue Lisinopril  20mg  daily. Recommend heart healthy diet such as Mediterranean diet with whole grains, fruits, vegetable, fish, lean meats, nuts, and olive oil. Limit salt. Encouraged moderate walking, 3-5 times/week for 30-50  minutes each session. Aim for at least 150 minutes.week. Goal should be pace of 3 miles/hours, or walking 1.5 miles in 30 minutes. Avoid tobacco products. Avoid excess alcohol. Take medications as prescribed and bring medications and blood pressure log with cuff to each office visit. Seek medical care for chest pain, palpitations, shortness of breath with exertion, dizziness/lightheadedness, vision changes, recurrent headaches, or swelling of extremities. Follow up in 6 months      Relevant Medications   ezetimibe  (ZETIA ) 10 MG tablet   Type 2 diabetes mellitus with stage 3a chronic kidney disease, without long-term current use of insulin  (HCC)   A1c 8.5% Taking Glipizide  5mg  TIDWC. Unable to tolerate Metformin . Started Rybelsus  3mg  daily for 1 month and was unable to obtain refills., resent to pharmacy. Recommend heart healthy diet such as Mediterranean diet with whole grains, fruits, vegetable, fish, lean meats, nuts, and olive oil. Limit salt. Encouraged moderate walking, 3-5 times/week for 30-50 minutes each session. Aim for at least 150 minutes.week. Goal should be pace of 3 miles/hours, or walking 1.5 miles in 30 minutes. Seek medical care for urinary frequency, extreme thirst, vision changes, lightheadedness, dizziness.  Follow up in 3 months.       Relevant Medications   Semaglutide  (RYBELSUS ) 3 MG TABS   CKD stage 3 due to type 2 diabetes mellitus (HCC)   Stable. Push fluids. Avoid NSAIDs and salt      Relevant Medications   Semaglutide  (RYBELSUS ) 3 MG TABS   Skin lesion of cheek   Hyperkeratotic lesion concerning for BCC left cheek, referral to derm      Relevant Orders   Ambulatory referral to Dermatology   Physical exam, annual - Primary   Today your medical history was reviewed and routine physical exam with labs was performed. Recommend 150 minutes of moderate intensity exercise weekly and consuming a well-balanced diet. Advised to stop smoking if a smoker, avoid smoking if a  non-smoker, limit alcohol consumption to 1 drink per day for women and 2 drinks per day for men, and avoid illicit drug use. Counseled in mental health awareness and when to seek medical care. Vaccine maintenance discussed. Appropriate health maintenance items reviewed. Return to office in 1 year for annual physical exam.       Other Visit Diagnoses       Encounter for screening mammogram for malignant neoplasm of breast       Relevant Orders   MM DIGITAL SCREENING BILATERAL     Encounter for  osteoporosis screening in asymptomatic postmenopausal patient       Relevant Orders   DG Bone Density      Return in about 3 months (around 08/13/2024) for chronic follow-up with labs 1 week prior.     Jeoffrey GORMAN Barrio, FNP

## 2024-05-13 NOTE — Progress Notes (Signed)
 Subjective:   Rachel Daniel is a 73 y.o. female who presents for Medicare Annual (Subsequent) preventive examination.  Visit Complete: In person  Patient Medicare AWV questionnaire was completed by the patient on 05/13/2024; I have confirmed that all information answered by patient is correct and no changes since this date.  Cardiac Risk Factors include: diabetes mellitus;advanced age (>17men, >32 women);family history of premature cardiovascular disease     Objective:    Today's Vitals   05/13/24 0930 05/13/24 0942  BP: 118/68   Pulse: 73   Temp: 98.5 F (36.9 C)   SpO2: 97%   Weight: 174 lb (78.9 kg)   Height: 5' 5.5 (1.664 m)   PainSc: 0-No pain 0-No pain   Body mass index is 28.51 kg/m.     05/13/2024    9:54 AM 12/17/2022    3:29 PM 10/23/2022    9:36 AM 12/12/2021    2:53 PM 09/24/2021   10:21 AM 12/05/2020   10:32 AM 09/25/2020   10:52 AM  Advanced Directives  Does Patient Have a Medical Advance Directive? Yes Yes Yes Yes Yes Yes Yes  Type of Estate agent of [place];Living will;Out of facility DNR (pink MOST or yellow form) Living will Healthcare Power of [place];Living will;Out of facility DNR (pink MOST or yellow form) Healthcare Power of [place];Living will Healthcare Power of [place];Living will;Out of facility DNR (pink MOST or yellow form) Healthcare Power of [place];Living will   Does patient want to make changes to medical advance directive? No - Patient declined   No - Patient declined     Copy of Healthcare Power of Attorney in Chart? Yes - validated most recent copy scanned in chart (See row information)   Yes - validated most recent copy scanned in chart (See row information)       Current Medications (verified) Outpatient Encounter Medications as of 05/13/2024  Medication Sig   aspirin EC 81 MG tablet Take 81 mg by mouth daily.   calcium carbonate (OS-CAL) 600 MG tablet Take by mouth.   CRANBERRY EXTRACT PO Take 2 tablets by  mouth daily.   ezetimibe  (ZETIA ) 10 MG tablet Take 1 tablet (10 mg total) by mouth daily.   FIBER ADULT GUMMIES PO Take by mouth daily.   lisinopril  (ZESTRIL ) 20 MG tablet Take 1 tablet (20 mg total) by mouth daily. for blood pressure   Multiple Vitamins-Minerals (MULTIVITAMIN PO) Take by mouth daily.   OVER THE COUNTER MEDICATION Uses OTC eye drops for dry eyes.   Phenazopyridine HCl (AZO TABS PO) Take 2 tablets by mouth daily.   rOPINIRole  (REQUIP ) 1 MG tablet TAKE 1 TABLET BY MOUTH THREE TIMES DAILY WITH MEALS FOR RESTLESS LEGS (Patient taking differently: Take 1 mg by mouth in the morning and at bedtime. TAKE 1 TABLET BY MOUTH THREE TIMES DAILY WITH MEALS FOR RESTLESS LEGS)   rOPINIRole  (REQUIP ) 3 MG tablet Take 1 tablet (3 mg total) by mouth at bedtime.   VITAMIN D , CHOLECALCIFEROL, PO Take 4,000 Units by mouth daily.   Semaglutide  (RYBELSUS ) 3 MG TABS Take 1 tablet (3 mg total) by mouth daily. (Patient not taking: Reported on 05/13/2024)   No facility-administered encounter medications on file as of 05/13/2024.    Allergies (verified) Hctz [hydrochlorothiazide], Metformin  and related, Phenylbutazones, and Tropicamide   History: Past Medical History:  Diagnosis Date   Allergy    otc med prn   Arthritis    hips, lower back   Cataract  bilateral   Chronic kidney disease    stage 3 per patient    Diet-controlled type 2 diabetes mellitus (HCC)    Diet controlled, no meds   Diverticulosis    Dribbling urine    EKG abnormalities    pt states she had a BBB on her last EKG, no problems   Essential hypertension, benign 08/19/2013   HPV in female    Hyperlipidemia    diet controlled, no med   Restless leg syndrome    Snoring    Vitamin D  deficiency    Past Surgical History:  Procedure Laterality Date   BREAST BIOPSY Left 1969   benign   BREAST EXCISIONAL BIOPSY Left 1969   benign   COLONOSCOPY  2012   kaplan-polyps, tics   LUMBAR LAMINECTOMY/DECOMPRESSION MICRODISCECTOMY   05/07/2012   Procedure: LUMBAR LAMINECTOMY/DECOMPRESSION MICRODISCECTOMY;  Surgeon: Oneil Rodgers Priestly, MD;  Location: [facility] OR;  Service: Orthopedics;  Laterality: Bilateral;  Lumbar 4-5 decompression   OVARIAN CYST REMOVAL  1981   right   TUBAL LIGATION  1981   VAGINAL HYSTERECTOMY  1992   Family History  Problem Relation Age of Onset   Hypertension Mother    Diabetes Mother    Stroke Mother    Heart failure Mother    Colon cancer Mother 56   Cataracts Mother    Heart failure Father 7   Kidney disease Father    Stroke Sister 81   Colon polyps Sister    Atrial fibrillation Sister    Diabetes Sister    Atrial fibrillation Sister    Diabetes Sister    Atrial fibrillation Sister    Breast cancer Sister        >50   Diabetes Paternal Grandfather    Colon polyps Brother    Atrial fibrillation Brother    Atrial fibrillation Brother    Stomach cancer Neg Hx    Esophageal cancer Neg Hx    Rectal cancer Neg Hx    Amblyopia Neg Hx    Blindness Neg Hx    Glaucoma Neg Hx    Macular degeneration Neg Hx    Retinal detachment Neg Hx    Strabismus Neg Hx    Retinitis pigmentosa Neg Hx    BRCA 1/2 Neg Hx    Social History   Socioeconomic History   Marital status: Widowed    Spouse name: Not on file   Number of children: Not on file   Years of education: Not on file   Highest education level: Not on file  Occupational History    Employer: [employer]  Tobacco Use   Smoking status: Never    Passive exposure: Past   Smokeless tobacco: Never  Vaping Use   Vaping status: Never Used  Substance and Sexual Activity   Alcohol use: No    Alcohol/week: 0.0 standard drinks of alcohol   Drug use: No   Sexual activity: Not Currently    Birth control/protection: Post-menopausal    Comment: Hysterectomy  Other Topics Concern   Not on file  Social History Narrative   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    Lives alone.     Highest level of education:  High  school   Right Handed   Social Drivers of Health   Financial Resource Strain: Not on file  Food Insecurity: No Food Insecurity (05/13/2024)   Hunger Vital Sign    Worried About Running Out of Food in the Last Year: Never true  Ran Out of Food in the Last Year: Never true  Transportation Needs: Patient Declined (05/13/2024)   PRAPARE - Administrator, Civil Service (Medical): Patient declined    Lack of Transportation (Non-Medical): Patient declined  Physical Activity: Not on file  Stress: Not on file  Social Connections: Not on file    Tobacco Counseling Counseling given: Not Answered   Clinical Intake:  Pre-visit preparation completed: Yes  Pain : No/denies pain Pain Score: 0-No pain     Nutritional Risks: None Diabetes: Yes CBG done?: Yes CBG resulted in Enter/ Edit results?: Yes Did pt. bring in CBG monitor from home?: No  How often do you need to have someone help you when you read instructions, pamphlets, or other written materials from your doctor or pharmacy?: 1 - Never  Interpreter Needed?: No  Information entered by :: Sheena   Activities of Daily Living    05/13/2024    9:44 AM 08/28/2023    9:21 PM  In your present state of health, do you have any difficulty performing the following activities:  Hearing? 0 0  Vision? 1 0  Difficulty concentrating or making decisions? 1 0  Comment a little trouble with memory . states she has to write things down   Walking or climbing stairs? 0 0  Dressing or bathing? 0 0  Doing errands, shopping? 0 0  Preparing Food and eating ? N   Using the Toilet? N   In the past six months, have you accidently leaked urine? Y   Do you have problems with loss of bowel control? N   Managing your Medications? N   Managing your Finances? N   Housekeeping or managing your Housekeeping? N     Patient Care Team: Kayla Jeoffrey RAMAN, FNP as PCP - General (Family Medicine) Tonita Fallow, MD as PCP - Internal Medicine  (Internal Medicine) Beuford Anes, MD as Consulting Physician (Orthopedic Surgery) Patel, Donika K, DO as Consulting Physician (Neurology)  Indicate any recent Medical Services you may have received from other than [facility] providers in the past year (date may be approximate).     Assessment:   This is a routine wellness examination for Rachel Daniel.  Hearing/Vision screen No results found.   Goals Addressed               This Visit's Progress     Patient Stated (pt-stated)        Pt. Stated she would like to work on Civil Service fast streamer and play memory building games.        Depression Screen    02/04/2024    3:29 PM 02/04/2024    3:28 PM 01/12/2024    2:11 PM 08/28/2023    9:21 PM 12/17/2022    3:32 PM 08/05/2022   11:17 PM 12/12/2021    2:57 PM  PHQ 2/9 Scores  PHQ - 2 Score 0 0 0 0 0 0 0  PHQ- 9 Score 0          Fall Risk    05/13/2024    9:48 AM 02/04/2024    3:28 PM 01/12/2024    2:11 PM 08/28/2023    9:20 PM 12/17/2022    3:32 PM  Fall Risk   Falls in the past year? 0 0 0 0 0  Number falls in past yr: 0 0 0    Injury with Fall? 0 0 0    Risk for fall due to : No Fall Risks  No Fall Risks No  Fall Risks   Follow up Falls evaluation completed Falls evaluation completed Falls prevention discussed;Falls evaluation completed Falls prevention discussed;Education provided;Falls evaluation completed     MEDICARE RISK AT HOME: Medicare Risk at Home Any stairs in or around the home?: No (only outside) If so, are there any without handrails?:  (the stairs outside have rails) Home free of loose throw rugs in walkways, pet beds, electrical cords, etc?: Yes Adequate lighting in your home to reduce risk of falls?: Yes Life alert?: No Use of a cane, walker or w/c?: No Grab bars in the bathroom?: No Shower chair or bench in shower?: No Elevated toilet seat or a handicapped toilet?: No  TIMED UP AND GO:  Was the test performed?  Yes  Length of time to ambulate 10 feet: 05 sec Gait steady and  fast without use of assistive device    Cognitive Function:        05/13/2024    9:52 AM 12/05/2020   11:06 AM  6CIT Screen  What Year? 0 points 0 points  What month? 0 points 0 points  What time? 0 points 0 points  Count back from 20  0 points  Months in reverse  0 points  Repeat phrase 0 points 0 points  Total Score  0 points    Immunizations Immunization History  Administered Date(s) Administered   Influenza, High Dose Seasonal PF 08/01/2017, 09/22/2018, 07/20/2019, 08/06/2022, 08/28/2023   Moderna SARS-COV2 Booster Vaccination 01/12/2021   Moderna Sars-Covid-2 Vaccination 01/18/2020, 02/15/2020, 08/18/2020   PFIZER Comirnaty(Gray Top)Covid-19 Tri-Sucrose Vaccine 07/06/2021   Pneumococcal Conjugate-13 04/05/2016   Pneumococcal Polysaccharide-23 04/15/2017   Tdap 03/07/2011   Zoster Recombinant(Shingrix ) 01/12/2024    TDAP status: Up to date  Flu Vaccine status: Up to date  Pneumococcal vaccine status: Up to date  Covid-19 vaccine status: Declined, Education has been provided regarding the importance of this vaccine but patient still declined. Advised may receive this vaccine at local pharmacy or Health Dept.or vaccine clinic. Aware to provide a copy of the vaccination record if obtained from local pharmacy or Health Dept. Verbalized acceptance and understanding.  Qualifies for Shingles Vaccine? Yes   Zostavax completed Yes   Shingrix  Completed?: Yes  Screening Tests Health Maintenance  Topic Date Due   DEXA SCAN  08/10/2022   Medicare Annual Wellness (AWV)  12/17/2023   Zoster Vaccines- Shingrix  (2 of 2) 03/08/2024   FOOT EXAM  05/11/2024   OPHTHALMOLOGY EXAM  05/13/2024 (Originally 12/30/2023)   COVID-19 Vaccine (5 - 2024-25 season) 05/29/2024 (Originally 06/15/2023)   DTaP/Tdap/Td (2 - Td or Tdap) 01/11/2025 (Originally 03/06/2021)   INFLUENZA VACCINE  05/14/2024   MAMMOGRAM  09/07/2024   HEMOGLOBIN A1C  11/10/2024   Diabetic kidney evaluation - eGFR  measurement  05/10/2025   Diabetic kidney evaluation - Urine ACR  05/10/2025   Colonoscopy  07/12/2026   Pneumococcal Vaccine: 50+ Years  Completed   Hepatitis C Screening  Completed   Hepatitis B Vaccines  Aged Out   HPV VACCINES  Aged Out   Meningococcal B Vaccine  Aged Out    Health Maintenance  Health Maintenance Due  Topic Date Due   DEXA SCAN  08/10/2022   Medicare Annual Wellness (AWV)  12/17/2023   Zoster Vaccines- Shingrix  (2 of 2) 03/08/2024   FOOT EXAM  05/11/2024    Colorectal cancer screening: Type of screening: Colonoscopy. Completed [id number]. Repeat every 10 years  Mammogram status: Ordered 05/13/2024. Pt provided with contact info and advised to call to schedule  appt.   Bone Density status: Ordered 05/13/2024. Pt provided with contact info and advised to call to schedule appt.  Lung Cancer Screening: (Low Dose CT Chest recommended if Age 56-80 years, 20 pack-year currently smoking OR have quit w/in 15years.) does not qualify.   Lung Cancer Screening Referral: n/a  Additional Screening:  Hepatitis C Screening: does not qualify; Completed 04/06/2015  Vision Screening: Recommended annual ophthalmology exams for early detection of glaucoma and other disorders of the eye. Is the patient up to date with their annual eye exam?  Yes  Who is the provider or what is the name of the office in which the patient attends annual eye exams? Dr. Valdemar If pt is not established with a provider, would they like to be referred to a provider to establish care? No .   Dental Screening: Recommended annual dental exams for proper oral hygiene  Diabetic Foot Exam: Diabetic Foot Exam: Completed 05/13/2024  Community Resource Referral / Chronic Care Management: CRR required this visit?  No   CCM required this visit?  No     Plan:     I have personally reviewed and noted the following in the patient's chart:   Medical and social history Use of alcohol, tobacco or illicit  drugs  Current medications and supplements including opioid prescriptions. Patient is not currently taking opioid prescriptions. Functional ability and status Nutritional status Physical activity Advanced directives List of other physicians Hospitalizations, surgeries, and ER visits in previous 12 months Vitals Screenings to include cognitive, depression, and falls Referrals and appointments  In addition, I have reviewed and discussed with patient certain preventive protocols, quality metrics, and best practice recommendations. A written personalized care plan for preventive services as well as general preventive health recommendations were provided to patient.     Jeoffrey GORMAN Barrio, FNP   05/13/2024   After Visit Summary: (In Person-Printed) AVS printed and given to the patient  Nurse Notes: n/a

## 2024-05-13 NOTE — Assessment & Plan Note (Signed)
 Chronic well controlled.  Continue Lisinopril  20mg  daily. Recommend heart healthy diet such as Mediterranean diet with whole grains, fruits, vegetable, fish, lean meats, nuts, and olive oil. Limit salt. Encouraged moderate walking, 3-5 times/week for 30-50 minutes each session. Aim for at least 150 minutes.week. Goal should be pace of 3 miles/hours, or walking 1.5 miles in 30 minutes. Avoid tobacco products. Avoid excess alcohol. Take medications as prescribed and bring medications and blood pressure log with cuff to each office visit. Seek medical care for chest pain, palpitations, shortness of breath with exertion, dizziness/lightheadedness, vision changes, recurrent headaches, or swelling of extremities. Follow up in 6 months

## 2024-05-13 NOTE — Assessment & Plan Note (Signed)

## 2024-05-13 NOTE — Assessment & Plan Note (Signed)
 Stable. Push fluids. Avoid NSAIDs and salt

## 2024-05-25 ENCOUNTER — Ambulatory Visit: Admitting: Dermatology

## 2024-05-25 ENCOUNTER — Encounter: Payer: Self-pay | Admitting: Dermatology

## 2024-05-25 DIAGNOSIS — L821 Other seborrheic keratosis: Secondary | ICD-10-CM | POA: Diagnosis not present

## 2024-05-25 DIAGNOSIS — L719 Rosacea, unspecified: Secondary | ICD-10-CM

## 2024-05-25 MED ORDER — METRONIDAZOLE 0.75 % EX CREA: TOPICAL_CREAM | Freq: Two times a day (BID) | CUTANEOUS | 11 refills | Status: AC

## 2024-05-25 NOTE — Patient Instructions (Signed)

## 2024-05-25 NOTE — Progress Notes (Signed)
   Follow-Up Visit   Subjective  Rachel Daniel is a 73 y.o. female who presents for the following: Irregular, itchy skin lesions on the face, pt concerned and would like checked today.Pt c/o rosacea on the face, she does get papules, and was broken out so bad last week she didn't want to go anywhere. No hx of skin cancer or irregular nevi.  The following portions of the chart were reviewed this encounter and updated as appropriate: medications, allergies, medical history  Review of Systems:  No other skin or systemic complaints except as noted in HPI or Assessment and Plan.  Objective  Well appearing patient in no apparent distress; mood and affect are within normal limits.   A focused examination was performed of the following areas: the face   Relevant exam findings are noted in the Assessment and Plan.    Assessment & Plan   SEBORRHEIC KERATOSIS - Stuck-on, waxy, tan-brown papules and/or plaques of the face - Benign-appearing - Discussed benign etiology and prognosis. - Observe - Call for any changes  ROSACEA - telangiectatic and inflammatory type of rosacea Exam Mid face erythema with telangiectasias +/- scattered inflammatory papules  Chronic and persistent condition with duration or expected duration over one year. Condition is symptomatic/ bothersome to patient. Not currently at goal.   Rosacea is a chronic progressive skin condition usually affecting the face of adults, causing redness and/or acne bumps. It is treatable but not curable. It sometimes affects the eyes (ocular rosacea) as well. It may respond to topical and/or systemic medication and can flare with stress, sun exposure, alcohol, exercise, topical steroids (including hydrocortisone/cortisone 10) and some foods.  Daily application of broad spectrum spf 30+ sunscreen to face is recommended to reduce flares.  Treatment Plan Start Metronidazole  0.75% cream BID. Pt defers Skin Medicinals mix at this  time.  Pt defers oral antibiotic due to hx of GI upset.   Counseling for BBL / IPL / Laser and Coordination of Care Discussed the treatment option of Broad Band Light (BBL) /Intense Pulsed Light (IPL)/ Laser for skin discoloration, including brown spots and redness.  Typically we recommend at least 1-3 treatment sessions about 5-8 weeks apart for best results.  Cannot have tanned skin when BBL performed, and regular use of sunscreen/photoprotection is advised after the procedure to help maintain results. The patient's condition may also require maintenance treatments in the future.  The fee for BBL / laser treatments is $350 per treatment session for the whole face.  A fee can be quoted for other parts of the body.  Insurance typically does not pay for BBL/laser treatments and therefore the fee is an out-of-pocket cost. Recommend prophylactic valtrex treatment. Once scheduled for procedure, will send Rx in prior to patient's appointment.   Pt to contact office in 2-3 mths via MyChart to report on condition. If doing well may cancel follow up appointment.  ROSACEA   SEBORRHEIC KERATOSES    Return for rosacea follow up in 2-3 mths.  LILLETTE Rosina Mayans, CMA, am acting as scribe for Boneta Sharps, MD .   Documentation: I have reviewed the above documentation for accuracy and completeness, and I agree with the above.  Boneta Sharps, MD

## 2024-06-08 ENCOUNTER — Telehealth: Payer: Self-pay | Admitting: Pharmacy Technician

## 2024-06-08 ENCOUNTER — Other Ambulatory Visit (HOSPITAL_COMMUNITY): Payer: Self-pay

## 2024-06-08 NOTE — Telephone Encounter (Signed)
 Pharmacy Patient Advocate Encounter   Received notification from Onbase that prior authorization for Rybelsus  3mg  tabs is required/requested.   Insurance verification completed.   The patient is insured through [employer] ADVANTAGE/RX ADVANCE .   Per test claim:  Rybelsus  7mg   is preferred by the insurance.  If suggested medication is appropriate, Please send in a new RX and discontinue this one. If not, please advise as to why it's not appropriate so that we may request a Prior Authorization. Please note, some preferred medications may still require a PA.  If the suggested medications have not been trialed and there are no contraindications to their use, the PA will not be submitted, as it will not be approved.  Her plan will pay for #60 of Rybelsus  3 mg every 365 days, plan requires titration unless patient is medically unable to titrate up.

## 2024-06-22 ENCOUNTER — Telehealth: Payer: Self-pay

## 2024-06-22 NOTE — Progress Notes (Signed)
   06/22/2024  Patient ID: Rachel Daniel, female   DOB: 1951-05-31, 73 y.o.   MRN: [id number]  This patient is appearing on a report for being at risk of failing the adherence measure for identified medications this calendar year.   Medication Adherence Summary (STAR/HEDIS Monitoring): Adherence Category: diabetes    Drug Name: RYBELSUS  3 MG TABLET Sold Date:05/18/2024 Days' Supply: 30 - I called the Pharmacy and was told last fill date as shown above. I called patient and she stated that she cannot tolerate a dose increase as she is having reflux. She states that she does not want to take this medication. Upcoming appointment with PCP on 08/03/2024. Advised patient to attend appointment.  - Confirms takes on an empty stomach, >=30 minutes before the first food, beverage, or other oral medications of the day with <=4 oz of plain water only. She confirms she eats small meals and avoid greasy foods.  - Advised patient to contact clinic on next steps of managing diabetes for future medication related complications. She stated she does not have cellular signal to utilize MyChart when she is at home.     Drug Name: GLIPIZIDE  5 MG TABLET Sold Date: 12/25/2023 Days' Supply: 38 - I called pharmacy to verify last filled date and the prescription is closed (per pharmacy the provider will need to put in new prescription). She states she stopped when she started on Rybelsus . She states that once she ran out of Rybelsus  she restarted glipizide .    Notes: ? Adherence data pulled from pharmacy claims portal Dr. Annemarie. ? Plan: MyChart message sent to patient. I called [facility] to verify fill hx. Will collaborate with PCP  Dorcas Solian, PharmD Clinical Pharmacist Cell: [phone]

## 2024-06-22 NOTE — Telephone Encounter (Signed)
 Copied from CRM [id number]. Topic: Clinical - Prescription Issue >> Jun 22, 2024  1:33 PM Deleta RAMAN wrote: Reason for CRM: patient is calling regarding medication issue for Rybelsus  3mg . Please check encounters for additional details. She is currently out of medication and would like a refill or another option. Please contact patient at [phone]

## 2024-06-24 ENCOUNTER — Other Ambulatory Visit: Payer: Self-pay

## 2024-06-24 DIAGNOSIS — E1165 Type 2 diabetes mellitus with hyperglycemia: Secondary | ICD-10-CM

## 2024-06-24 MED ORDER — GLIPIZIDE 5 MG PO TABS: ORAL_TABLET | ORAL | 1 refills | Status: DC

## 2024-06-28 ENCOUNTER — Other Ambulatory Visit (HOSPITAL_COMMUNITY): Payer: Self-pay

## 2024-07-01 ENCOUNTER — Encounter: Payer: Self-pay | Admitting: Family Medicine

## 2024-07-01 ENCOUNTER — Telehealth: Admitting: Family Medicine

## 2024-07-01 DIAGNOSIS — Z7984 Long term (current) use of oral hypoglycemic drugs: Secondary | ICD-10-CM | POA: Diagnosis not present

## 2024-07-01 DIAGNOSIS — N1831 Chronic kidney disease, stage 3a: Secondary | ICD-10-CM | POA: Diagnosis not present

## 2024-07-01 DIAGNOSIS — E1122 Type 2 diabetes mellitus with diabetic chronic kidney disease: Secondary | ICD-10-CM | POA: Diagnosis not present

## 2024-07-01 NOTE — Assessment & Plan Note (Signed)
 Last A1c 8.5% Resumed Glipizide  5mg  TIDWC. Unable to tolerate Metformin  or Rybelsus . Discussed Jardaince as option with added kidne protection and she would like to work on diet and exercise for now and recheck in 2 months.   Recommend heart healthy diet such as Mediterranean diet with whole grains, fruits, vegetable, fish, lean meats, nuts, and olive oil. Limit salt. Encouraged moderate walking, 3-5 times/week for 30-50 minutes each session. Aim for at least 150 minutes.week. Goal should be pace of 3 miles/hours, or walking 1.5 miles in 30 minutes. Seek medical care for urinary frequency, extreme thirst, vision changes, lightheadedness, dizziness.  Follow up in 2 months.

## 2024-07-01 NOTE — Progress Notes (Signed)
 Virtual Visit via Video note  I connected with Rachel Daniel on 07/01/24 at [phone] by video and verified that I am speaking with the correct person using two identifiers. Rachel Daniel is currently located at home and no one is currently with her during visit. The provider, Jeoffrey GORMAN Barrio, FNP is located in their office at time of visit.  I discussed the limitations, risks, security and privacy concerns of performing an evaluation and management service by video and the availability of in person appointments. I also discussed with the patient that there may be a patient responsible charge related to this service. The patient expressed understanding and agreed to proceed.  Subjective: PCP: Barrio Jeoffrey GORMAN, FNP  No chief complaint on file.   HPI Rachel Daniel is connected today to discuss medciations for diabetes control. Her last A1c was 8.5% on Glipizide  5mg  TID. She was started on Rybelsus  in April however it has become too costly for her and she was experiencing severe nausea and reflux. She stopped taking Rybelsus  2-3 weeks ago and has resumed Glipizide  5mg  TID. Did not tolerate Metformin  due to nausea.  Home BG readings have been coming down to 147 from 180-220 without medication. Does not want to try another medication at this time, would like to work on improved diet and increase in physical activity.   ROS: Per HPI  Current Outpatient Medications:    aspirin EC 81 MG tablet, Take 81 mg by mouth daily., Disp: , Rfl:    calcium carbonate (OS-CAL) 600 MG tablet, Take by mouth., Disp: , Rfl:    CRANBERRY EXTRACT PO, Take 2 tablets by mouth daily., Disp: , Rfl:    ezetimibe  (ZETIA ) 10 MG tablet, Take 1 tablet (10 mg total) by mouth daily., Disp: 90 tablet, Rfl: 2   FIBER ADULT GUMMIES PO, Take by mouth daily., Disp: , Rfl:    glipiZIDE  (GLUCOTROL ) 5 MG tablet, TAKE 1/2 TO 1 TABLET BY MOUTH THREE TIMES DAILY WITH MEALS FOR DIABETES, Disp: 270 tablet, Rfl: 1   lisinopril  (ZESTRIL ) 20 MG  tablet, Take 1 tablet (20 mg total) by mouth daily. for blood pressure, Disp: 90 tablet, Rfl: 3   metroNIDAZOLE  (METROCREAM ) 0.75 % cream, Apply topically 2 (two) times daily., Disp: 45 g, Rfl: 11   Multiple Vitamins-Minerals (MULTIVITAMIN PO), Take by mouth daily., Disp: , Rfl:    OVER THE COUNTER MEDICATION, Uses OTC eye drops for dry eyes., Disp: , Rfl:    Phenazopyridine HCl (AZO TABS PO), Take 2 tablets by mouth daily., Disp: , Rfl:    rOPINIRole  (REQUIP ) 1 MG tablet, TAKE 1 TABLET BY MOUTH THREE TIMES DAILY WITH MEALS FOR RESTLESS LEGS (Patient taking differently: Take 1 mg by mouth in the morning and at bedtime. TAKE 1 TABLET BY MOUTH THREE TIMES DAILY WITH MEALS FOR RESTLESS LEGS), Disp: 270 tablet, Rfl: 3   rOPINIRole  (REQUIP ) 3 MG tablet, Take 1 tablet (3 mg total) by mouth at bedtime., Disp: 90 tablet, Rfl: 3   VITAMIN D , CHOLECALCIFEROL, PO, Take 4,000 Units by mouth daily., Disp: , Rfl:   Observations/Objective: Physical Exam Constitutional:      General: She is not in acute distress.    Appearance: Normal appearance. She is not toxic-appearing.  Eyes:     Conjunctiva/sclera: Conjunctivae normal.  Pulmonary:     Effort: Pulmonary effort is normal. No respiratory distress.  Skin:    Coloration: Skin is not pale.  Neurological:     General: No focal deficit  present.     Mental Status: She is alert and oriented to person, place, and time.  Psychiatric:        Mood and Affect: Mood normal.        Behavior: Behavior normal.        Thought Content: Thought content normal.        Judgment: Judgment normal.    Assessment and Plan: Type 2 diabetes mellitus with stage 3a chronic kidney disease, without long-term current use of insulin  (HCC) Assessment & Plan: Last A1c 8.5% Resumed Glipizide  5mg  TIDWC. Unable to tolerate Metformin  or Rybelsus . Discussed Jardaince as option with added kidne protection and she would like to work on diet and exercise for now and recheck in 2 months.    Recommend heart healthy diet such as Mediterranean diet with whole grains, fruits, vegetable, fish, lean meats, nuts, and olive oil. Limit salt. Encouraged moderate walking, 3-5 times/week for 30-50 minutes each session. Aim for at least 150 minutes.week. Goal should be pace of 3 miles/hours, or walking 1.5 miles in 30 minutes. Seek medical care for urinary frequency, extreme thirst, vision changes, lightheadedness, dizziness.  Follow up in 2 months.      Follow Up Instructions: Return for as scheduled.   I discussed the assessment and treatment plan with the patient. The patient was provided an opportunity to ask questions and all were answered. The patient agreed with the plan and demonstrated an understanding of the instructions.   The patient was advised to call back or seek an in-person evaluation if the symptoms worsen or if the condition fails to improve as anticipated.  The above assessment and management plan was discussed with the patient. The patient verbalized understanding of and has agreed to the management plan. Patient is aware to call the clinic if symptoms persist or worsen. Patient is aware when to return to the clinic for a follow-up visit. Patient educated on when it is appropriate to go to the emergency department.   Time call ended: 0957  I provided 11 minutes of face-to-face time during this encounter.   Jeoffrey Barrio, MSN, APRN, FNP-C [facility]

## 2024-07-13 NOTE — Addendum Note (Signed)
 Addended by: KAYLA JEOFFREY RAMAN on: 07/13/2024 10:34 AM   Modules accepted: Level of Service

## 2024-08-03 ENCOUNTER — Encounter: Payer: Self-pay | Admitting: Dermatology

## 2024-08-03 ENCOUNTER — Ambulatory Visit: Admitting: Dermatology

## 2024-08-03 DIAGNOSIS — L719 Rosacea, unspecified: Secondary | ICD-10-CM | POA: Diagnosis not present

## 2024-08-03 NOTE — Progress Notes (Signed)
   Follow-Up Visit   Subjective  Rachel Daniel is a 73 y.o. female who presents for the following: Rosacea f/u using metronidazole  0.75% cream with some improvement.  The following portions of the chart were reviewed this encounter and updated as appropriate: medications, allergies, medical history  Review of Systems:  No other skin or systemic complaints except as noted in HPI or Assessment and Plan.  Objective  Well appearing patient in no apparent distress; mood and affect are within normal limits.  A focused examination was performed of the following areas: Face  Relevant exam findings are noted in the Assessment and Plan.    Assessment & Plan   ROSACEA Exam Mid face erythema with telangiectasias, clearance of inflammatory papules  Chronic and persistent condition with duration or expected duration over one year. Well controlled at patient goal  Rosacea is a chronic progressive skin condition usually affecting the face of adults, causing redness and/or acne bumps. It is treatable but not curable. It sometimes affects the eyes (ocular rosacea) as well. It may respond to topical and/or systemic medication and can flare with stress, sun exposure, alcohol, exercise, topical steroids (including hydrocortisone/cortisone 10) and some foods.  Daily application of broad spectrum spf 30+ sunscreen to face is recommended to reduce flares.  Patient denies grittiness of the eyes  Treatment Plan Continue metronidazole  0.75% cream BID.    ROSACEA    Return in about 1 year (around 08/03/2025) for Rosacea, w/ Dr. Claudene.  I, Jacquelynn V. Wilfred, CMA, am acting as scribe for Boneta Claudene, MD .   Documentation: I have reviewed the above documentation for accuracy and completeness, and I agree with the above.  Boneta Claudene, MD

## 2024-08-03 NOTE — Patient Instructions (Signed)

## 2024-08-05 ENCOUNTER — Other Ambulatory Visit

## 2024-08-05 DIAGNOSIS — E1169 Type 2 diabetes mellitus with other specified complication: Secondary | ICD-10-CM

## 2024-08-05 DIAGNOSIS — I10 Essential (primary) hypertension: Secondary | ICD-10-CM

## 2024-08-05 DIAGNOSIS — E785 Hyperlipidemia, unspecified: Secondary | ICD-10-CM | POA: Diagnosis not present

## 2024-08-06 LAB — COMPREHENSIVE METABOLIC PANEL WITH GFR
AG Ratio: 1.4 (calc) (ref 1.0–2.5)
ALT: 19 U/L (ref 6–29)
AST: 18 U/L (ref 10–35)
Albumin: 4.2 g/dL (ref 3.6–5.1)
Alkaline phosphatase (APISO): 76 U/L (ref 37–153)
BUN/Creatinine Ratio: 18 (calc) (ref 6–22)
BUN: 20 mg/dL (ref 7–25)
CO2: 27 mmol/L (ref 20–32)
Calcium: 9.2 mg/dL (ref 8.6–10.4)
Chloride: 101 mmol/L (ref 98–110)
Creat: 1.1 mg/dL — ABNORMAL HIGH (ref 0.60–1.00)
Globulin: 3.1 g/dL (ref 1.9–3.7)
Glucose, Bld: 132 mg/dL — ABNORMAL HIGH (ref 65–99)
Potassium: 4.3 mmol/L (ref 3.5–5.3)
Sodium: 136 mmol/L (ref 135–146)
Total Bilirubin: 0.5 mg/dL (ref 0.2–1.2)
Total Protein: 7.3 g/dL (ref 6.1–8.1)
eGFR: 53 mL/min/1.73m2 — ABNORMAL LOW (ref >=60)

## 2024-08-06 LAB — LIPID PANEL
Cholesterol: 125 mg/dL (ref <200)
HDL: 38 mg/dL — ABNORMAL LOW (ref >=50)
LDL Cholesterol (Calc): 71 mg/dL
Non-HDL Cholesterol (Calc): 87 mg/dL (ref <130)
Total CHOL/HDL Ratio: 3.3 (calc) (ref <5.0)
Triglycerides: 81 mg/dL (ref <150)

## 2024-08-06 LAB — VITAMIN B12: Vitamin B-12: 375 pg/mL (ref 200–1100)

## 2024-08-11 ENCOUNTER — Ambulatory Visit: Payer: Self-pay | Admitting: Family Medicine

## 2024-08-15 ENCOUNTER — Other Ambulatory Visit: Payer: Self-pay

## 2024-08-15 NOTE — Progress Notes (Signed)
 Contacted patient to discuss potential medication access barriers related to use of non-preferred pharmacy. Discussed health plan preferred pharmacies.  Patient would prefer to call back a different time. Will follow up with a MyChart message with a direct call back number.   Woodie Jock, PharmD PGY1 Pharmacy Resident  08/15/2024

## 2024-08-16 ENCOUNTER — Encounter: Payer: Self-pay | Admitting: Family Medicine

## 2024-08-16 ENCOUNTER — Ambulatory Visit: Admitting: Family Medicine

## 2024-08-16 VITALS — BP 119/82 | HR 71 | Temp 98.2°F | Ht 65.5 in | Wt 172.6 lb

## 2024-08-16 DIAGNOSIS — Z780 Asymptomatic menopausal state: Secondary | ICD-10-CM | POA: Diagnosis not present

## 2024-08-16 DIAGNOSIS — Z7984 Long term (current) use of oral hypoglycemic drugs: Secondary | ICD-10-CM | POA: Diagnosis not present

## 2024-08-16 DIAGNOSIS — N1831 Chronic kidney disease, stage 3a: Secondary | ICD-10-CM

## 2024-08-16 DIAGNOSIS — Z13820 Encounter for screening for osteoporosis: Secondary | ICD-10-CM

## 2024-08-16 DIAGNOSIS — E1122 Type 2 diabetes mellitus with diabetic chronic kidney disease: Secondary | ICD-10-CM | POA: Diagnosis not present

## 2024-08-16 DIAGNOSIS — Z23 Encounter for immunization: Secondary | ICD-10-CM | POA: Diagnosis not present

## 2024-08-16 LAB — HEMOGLOBIN A1C
Hgb A1c MFr Bld: 7.8 % — ABNORMAL HIGH (ref <5.7)
Mean Plasma Glucose: 177 mg/dL
eAG (mmol/L): 9.8 mmol/L

## 2024-08-16 NOTE — Progress Notes (Signed)
 Acute Office Visit  Patient ID: Rachel Daniel, female    DOB: 09/22/1951, 74 y.o.   MRN: [id number]  PCP: Kayla Jeoffrey RAMAN, FNP  Chief Complaint  Patient presents with   Medical Management of Chronic Issues    3 mo f/u  Medication concerns  Flu vaccination      Subjective:     HPI  Rachel Daniel here today for chronic condition management.  PMH includes DM2, HTN, HLD, CKD 3a, obesity, vitamin D  deficiency.   HPI 07/01/2024: HPI Rachel Daniel is connected today to discuss medciations for diabetes control. Her last A1c was 8.5% on Glipizide  5mg  TID. She was started on Rybelsus  in April however it has become too costly for her and she was experiencing severe nausea and reflux. She stopped taking Rybelsus  2-3 weeks ago and has resumed Glipizide  5mg  TID. Did not tolerate Metformin  due to nausea.  Home BG readings have been coming down to 147 from 180-220 without medication. Does not want to try another medication at this time, would like to work on improved diet and increase in physical activity.   Discussed the use of AI scribe software for clinical note transcription with the patient, who gave verbal consent to proceed.  History of Present Illness Rachel Daniel is a 73 year old female with diabetes and chronic kidney disease who presents for a follow-up visit.  She is managing her diabetes with glipizide  three times daily. Her recent fasting blood sugar levels were 120 mg/dL yesterday and 874 mg/dL today. Previously, she monitored her blood sugar for a week, noting higher levels, but discontinued due to finger soreness. She is attempting to manage her blood sugar through diet and exercise, walking 30 minutes daily and limiting carbohydrates and sweets. She has a history of intolerance to Rybelsus  and metformin  due to significant side effects.  Her current medications include Zetia  for cholesterol, aspirin, lisinopril  for blood pressure, ropinirole  for restless leg syndrome, and  vitamin D . Her cholesterol and kidney function are stable. No chest pain, palpitations, recurring headaches, or vision changes, except those related to cataract surgery.  She is due for a DEXA scan and mammogram, with the mammogram scheduled for September 08, 2024. She has received both shingles vaccinations but has not yet received her flu shot for the current season.   Review of Systems  All other systems reviewed and are negative.   Past Medical History:  Diagnosis Date   Allergy    otc med prn   Arthritis    hips, lower back   Cataract    bilateral   Chronic kidney disease    stage 3 per patient    Diet-controlled type 2 diabetes mellitus (HCC)    Diet controlled, no meds   Diverticulosis    Dribbling urine    EKG abnormalities    pt states she had a BBB on her last EKG, no problems   Essential hypertension, benign 08/19/2013   HPV in female    Hyperlipidemia    diet controlled, no med   Restless leg syndrome    Snoring    Vitamin D  deficiency     Past Surgical History:  Procedure Laterality Date   BREAST BIOPSY Left 1969   benign   BREAST EXCISIONAL BIOPSY Left 1969   benign   COLONOSCOPY  2012   kaplan-polyps, tics   LUMBAR LAMINECTOMY/DECOMPRESSION MICRODISCECTOMY  05/07/2012   Procedure: LUMBAR LAMINECTOMY/DECOMPRESSION MICRODISCECTOMY;  Surgeon: Oneil Rodgers Priestly, MD;  Location: [facility] OR;  Service:  Orthopedics;  Laterality: Bilateral;  Lumbar 4-5 decompression   OVARIAN CYST REMOVAL  1981   right   TUBAL LIGATION  1981   VAGINAL HYSTERECTOMY  1992    Current Outpatient Medications on File Prior to Visit  Medication Sig Dispense Refill   aspirin EC 81 MG tablet Take 81 mg by mouth daily.     calcium carbonate (OS-CAL) 600 MG tablet Take by mouth.     CRANBERRY EXTRACT PO Take 2 tablets by mouth daily.     ezetimibe  (ZETIA ) 10 MG tablet Take 1 tablet (10 mg total) by mouth daily. 90 tablet 2   FIBER ADULT GUMMIES PO Take by mouth daily.     glipiZIDE   (GLUCOTROL ) 5 MG tablet TAKE 1/2 TO 1 TABLET BY MOUTH THREE TIMES DAILY WITH MEALS FOR DIABETES 270 tablet 1   lisinopril  (ZESTRIL ) 20 MG tablet Take 1 tablet (20 mg total) by mouth daily. for blood pressure 90 tablet 3   metroNIDAZOLE  (METROCREAM ) 0.75 % cream Apply topically 2 (two) times daily. 45 g 11   Multiple Vitamins-Minerals (MULTIVITAMIN PO) Take by mouth daily.     OVER THE COUNTER MEDICATION Uses OTC eye drops for dry eyes.     Phenazopyridine HCl (AZO TABS PO) Take 2 tablets by mouth daily.     rOPINIRole  (REQUIP ) 1 MG tablet TAKE 1 TABLET BY MOUTH THREE TIMES DAILY WITH MEALS FOR RESTLESS LEGS (Patient taking differently: Take 1 mg by mouth in the morning and at bedtime. TAKE 1 TABLET BY MOUTH THREE TIMES DAILY WITH MEALS FOR RESTLESS LEGS) 270 tablet 3   rOPINIRole  (REQUIP ) 3 MG tablet Take 1 tablet (3 mg total) by mouth at bedtime. 90 tablet 3   VITAMIN D , CHOLECALCIFEROL, PO Take 4,000 Units by mouth daily.     No current facility-administered medications on file prior to visit.    Allergies  Allergen Reactions   Hctz [Hydrochlorothiazide]     Leg cramping   Metformin  And Related Nausea Only   Phenylbutazones Swelling   Tropicamide Swelling    Red /itching/watery eyes       Objective:    BP 119/82   Pulse 71   Temp 98.2 F (36.8 C)   Ht 5' 5.5 (1.664 m)   Wt 172 lb 9.6 oz (78.3 kg)   SpO2 99%   BMI 28.29 kg/m  BP Readings from Last 3 Encounters:  08/16/24 119/82  05/13/24 118/68  02/04/24 126/78   Wt Readings from Last 3 Encounters:  08/16/24 172 lb 9.6 oz (78.3 kg)  05/13/24 174 lb (78.9 kg)  02/04/24 175 lb 0.6 oz (79.4 kg)      Physical Exam Vitals and nursing note reviewed.  Constitutional:      Appearance: Normal appearance. She is normal weight.  HENT:     Head: Normocephalic and atraumatic.  Cardiovascular:     Rate and Rhythm: Normal rate and regular rhythm.     Pulses: Normal pulses.     Heart sounds: Normal heart sounds.  Pulmonary:      Effort: Pulmonary effort is normal.     Breath sounds: Normal breath sounds.  Skin:    General: Skin is warm and dry.  Neurological:     General: No focal deficit present.     Mental Status: She is alert and oriented to person, place, and time. Mental status is at baseline.  Psychiatric:        Mood and Affect: Mood normal.  Behavior: Behavior normal.        Thought Content: Thought content normal.        Judgment: Judgment normal.       No results found for any visits on 08/16/24.     Assessment & Plan:   Problem List Items Addressed This Visit     Type 2 diabetes mellitus with stage 3a chronic kidney disease, without long-term current use of insulin  (HCC)   Relevant Orders   Hemoglobin A1c   Other Visit Diagnoses       Encounter for osteoporosis screening in asymptomatic postmenopausal patient    -  Primary   Relevant Orders   DG Bone Density       Assessment and Plan Assessment & Plan Type 2 diabetes mellitus with stage 3a chronic kidney disease Fasting blood sugars 120-125 mg/dL, indicating good control. Previous intolerance to Rybelsus  and metformin . Current regimen includes glipizide  5 mg TID. A1c expected near 6%. Discussed Jardiance initiation for diabetes management and kidney protection, contingent on A1c and insurance. - Ordered A1c test. - Consider Jardiance if A1c high and insurance covers.  Hyperlipidemia Cholesterol levels well-managed with current medication. - Continue ezetimibe  (Zetia ) 10 mg daily.  Hypertension Blood pressure well-controlled with current medication. - Continue lisinopril  20 mg daily.  Restless legs syndrome Symptoms managed with ropinirole  (Requip ). - Continue ropinirole  (Requip ) as prescribed.  Osteoporosis screening Due for DEXA scan. Discussed scheduling options, preference for [place] on [address]. - Ordered D[facility]A scan at [facility] on [address]. - Ensure calcium and vitamin D  supplementation.  General Health  Maintenance Discussed importance of regular screenings and vaccinations. Confirmed shingles vaccine, not flu vaccine.    No orders of the defined types were placed in this encounter.   Return in about 3 months (around 11/30/2024) for chronic follow-up with labs 1 week prior.  Jeoffrey GORMAN Barrio, FNP [facility]

## 2024-08-17 ENCOUNTER — Other Ambulatory Visit (HOSPITAL_COMMUNITY): Payer: Self-pay

## 2024-08-17 NOTE — Progress Notes (Signed)
 Patient called back yesterday, and I returned her call today.   Further discussed preferred pharmacies with her current health plan. [facility] but is unsure about the process of moving pharmacies. Counseled on the next steps of how to transfer pharmacies. [facility] her preferred [facility] in Epic [facility] [address] in [place]. Encouraged her to call back with any additional questions or concerns.   Woodie Jock, PharmD PGY1 Pharmacy Resident  08/17/2024

## 2024-08-18 ENCOUNTER — Ambulatory Visit: Payer: Self-pay | Admitting: Family Medicine

## 2024-08-30 ENCOUNTER — Ambulatory Visit: Payer: PRIVATE HEALTH INSURANCE | Admitting: Internal Medicine

## 2024-09-08 ENCOUNTER — Ambulatory Visit
Admission: RE | Admit: 2024-09-08 | Discharge: 2024-09-08 | Disposition: A | Discharge status: Home / Self Care | Source: Ambulatory Visit | Attending: Family Medicine | Admitting: Family Medicine

## 2024-09-08 DIAGNOSIS — Z1231 Encounter for screening mammogram for malignant neoplasm of breast: Secondary | ICD-10-CM

## 2024-09-29 ENCOUNTER — Other Ambulatory Visit: Payer: Self-pay | Admitting: Nurse Practitioner

## 2024-09-29 DIAGNOSIS — G2581 Restless legs syndrome: Secondary | ICD-10-CM

## 2024-09-30 ENCOUNTER — Other Ambulatory Visit: Payer: Self-pay | Admitting: Family Medicine

## 2024-09-30 DIAGNOSIS — E1165 Type 2 diabetes mellitus with hyperglycemia: Secondary | ICD-10-CM

## 2024-10-15 ENCOUNTER — Other Ambulatory Visit: Payer: Self-pay | Admitting: Family Medicine

## 2024-10-15 DIAGNOSIS — G2581 Restless legs syndrome: Secondary | ICD-10-CM

## 2024-10-15 MED ORDER — ROPINIROLE HCL 3 MG PO TABS: 3.0000 mg | ORAL_TABLET | Freq: Every day | ORAL | 1 refills | Status: AC

## 2024-10-15 NOTE — Telephone Encounter (Signed)
 Requested Prescriptions  Pending Prescriptions Disp Refills   rOPINIRole  (REQUIP ) 3 MG tablet 90 tablet 1    Sig: Take 1 tablet (3 mg total) by mouth at bedtime.     Neurology:  Parkinsonian Agents Passed - 10/15/2024  5:16 PM      Passed - Last BP in normal range    BP Readings from Last 1 Encounters:  08/16/24 119/82         Passed - Last Heart Rate in normal range    Pulse Readings from Last 1 Encounters:  08/16/24 71         Passed - Valid encounter within last 12 months    Recent Outpatient Visits           2 months ago Encounter for osteoporosis screening in asymptomatic postmenopausal patient   [facility] Kayla Gauze S, FNP   3 months ago Type 2 diabetes mellitus with stage 3a chronic kidney disease, without long-term current use of insulin  [facility])   [place] [facility] Kayla Gauze RAMAN, FNP   5 months ago Physical exam, annual   [place] [facility] Kayla Gauze RAMAN, FNP   8 months ago Type 2 diabetes mellitus with stage 3a chronic kidney disease, without long-term current use of insulin  [facility])   [place] [facility] Kayla Gauze RAMAN, FNP   9 months ago Encounter to establish care with new doctor   [place] [facility] Kayla Gauze RAMAN, FNP       Future Appointments             In 9 months Claudene Lehmann, MD [facility]

## 2024-10-15 NOTE — Telephone Encounter (Signed)
 Copied from CRM [id number]. Topic: Clinical - Medication Refill >> Oct 15, 2024 12:28 PM Shanda MATSU wrote: Medication: rOPINIRole  (REQUIP ) 3 MG tablet  Has the patient contacted their pharmacy? Yes, referred to provider (Agent: If no, request that the patient contact the pharmacy for the refill. If patient does not wish to contact the pharmacy document the reason why and proceed with request.) (Agent: If yes, when and what did the pharmacy advise?)  This is the patient's preferred pharmacy:  [id number] [place] [place] - [address] [place] [address] Phone: [phone] Fax: [phone]   Is this the correct pharmacy for this prescription? Yes If no, delete pharmacy and type the correct one.   Has the prescription been filled recently? No  Is the patient out of the medication? No  Has the patient been seen for an appointment in the last year OR does the patient have an upcoming appointment? Yes  Can we respond through MyChart? Yes  Agent: Please be advised that Rx refills may take up to 3 business days. We ask that you follow-up with your pharmacy.

## 2024-11-25 ENCOUNTER — Other Ambulatory Visit

## 2024-11-25 DIAGNOSIS — I10 Essential (primary) hypertension: Secondary | ICD-10-CM

## 2024-11-25 DIAGNOSIS — E1169 Type 2 diabetes mellitus with other specified complication: Secondary | ICD-10-CM

## 2024-11-25 DIAGNOSIS — E1122 Type 2 diabetes mellitus with diabetic chronic kidney disease: Secondary | ICD-10-CM

## 2024-11-25 DIAGNOSIS — E559 Vitamin D deficiency, unspecified: Secondary | ICD-10-CM

## 2024-11-29 ENCOUNTER — Ambulatory Visit: Admitting: Family Medicine

## 2024-12-01 ENCOUNTER — Ambulatory Visit: Admitting: Family Medicine

## 2024-12-29 ENCOUNTER — Encounter (INDEPENDENT_AMBULATORY_CARE_PROVIDER_SITE_OTHER): Admitting: Ophthalmology

## 2025-08-04 ENCOUNTER — Ambulatory Visit: Admitting: Dermatology
